# Patient Record
Sex: Female | Born: 1935 | Race: White | Hispanic: No | Smoking: Never smoker
Health system: Southern US, Community
[De-identification: ages and names within clinical notes are randomized; demographics above are authoritative.]

## PROBLEM LIST (undated history)

## (undated) DIAGNOSIS — I10 Essential (primary) hypertension: Secondary | ICD-10-CM

## (undated) DIAGNOSIS — I4891 Unspecified atrial fibrillation: Secondary | ICD-10-CM

## (undated) DIAGNOSIS — R55 Syncope and collapse: Secondary | ICD-10-CM

## (undated) DIAGNOSIS — R4020 Unspecified coma: Secondary | ICD-10-CM

## (undated) HISTORY — DX: Unspecified coma: R40.20

## (undated) HISTORY — DX: Unspecified atrial fibrillation: I48.91

## (undated) HISTORY — PX: BARTHOLIN GLAND CYST EXCISION: SHX565

---

## 2010-04-03 ENCOUNTER — Emergency Department (HOSPITAL_COMMUNITY): Payer: Medicare Other

## 2010-04-03 ENCOUNTER — Inpatient Hospital Stay (HOSPITAL_COMMUNITY)
Admission: EM | Admit: 2010-04-03 | Discharge: 2010-04-06 | DRG: 312 | Disposition: A | Discharge status: Home / Self Care | Payer: Medicare Other | Attending: Internal Medicine | Admitting: Internal Medicine

## 2010-04-03 DIAGNOSIS — R55 Syncope and collapse: Principal | ICD-10-CM | POA: Diagnosis present

## 2010-04-03 DIAGNOSIS — E785 Hyperlipidemia, unspecified: Secondary | ICD-10-CM | POA: Diagnosis present

## 2010-04-03 DIAGNOSIS — D72829 Elevated white blood cell count, unspecified: Secondary | ICD-10-CM | POA: Diagnosis present

## 2010-04-03 DIAGNOSIS — Z8249 Family history of ischemic heart disease and other diseases of the circulatory system: Secondary | ICD-10-CM

## 2010-04-03 DIAGNOSIS — I6789 Other cerebrovascular disease: Secondary | ICD-10-CM | POA: Diagnosis present

## 2010-04-03 DIAGNOSIS — I10 Essential (primary) hypertension: Secondary | ICD-10-CM | POA: Diagnosis present

## 2010-04-03 DIAGNOSIS — R000 Tachycardia, unspecified: Secondary | ICD-10-CM | POA: Diagnosis present

## 2010-04-03 DIAGNOSIS — E860 Dehydration: Secondary | ICD-10-CM | POA: Diagnosis present

## 2010-04-03 DIAGNOSIS — Z7982 Long term (current) use of aspirin: Secondary | ICD-10-CM

## 2010-04-03 LAB — CBC
MCH: 34.4 pg — ABNORMAL HIGH (ref 26.0–34.0)
MCHC: 34.7 g/dL (ref 30.0–36.0)
MCV: 99 fL (ref 78.0–100.0)
Platelets: 202 10*3/uL (ref 150–400)
RBC: 4.19 MIL/uL (ref 3.87–5.11)
RDW: 12.5 % (ref 11.5–15.5)

## 2010-04-03 LAB — BASIC METABOLIC PANEL
BUN: 17 mg/dL (ref 6–23)
CO2: 24 mEq/L (ref 19–32)
Calcium: 9.2 mg/dL (ref 8.4–10.5)
Chloride: 107 mEq/L (ref 96–112)
Creatinine, Ser: 0.92 mg/dL (ref 0.4–1.2)
GFR calc Af Amer: >60 mL/min (ref >60)
GFR calc non Af Amer: >60 mL/min (ref >60)
Glucose, Bld: 102 mg/dL — ABNORMAL HIGH (ref 70–99)
Potassium: 4.2 mEq/L (ref 3.5–5.1)
Sodium: 145 mEq/L (ref 135–145)

## 2010-04-03 LAB — HEPATIC FUNCTION PANEL
ALT: 23 U/L (ref 0–35)
AST: 25 U/L (ref 0–37)
Albumin: 3.8 g/dL (ref 3.5–5.2)
Alkaline Phosphatase: 83 U/L (ref 39–117)
Bilirubin, Direct: <0.1 mg/dL (ref 0.0–0.3)
Total Bilirubin: 0.4 mg/dL (ref 0.3–1.2)
Total Protein: 7.2 g/dL (ref 6.0–8.3)

## 2010-04-03 LAB — URINALYSIS, ROUTINE W REFLEX MICROSCOPIC
Bilirubin Urine: NEGATIVE
Ketones, ur: 15 mg/dL — AB
Nitrite: NEGATIVE
Specific Gravity, Urine: 1.02 (ref 1.005–1.030)
Urobilinogen, UA: 1 mg/dL (ref 0.0–1.0)

## 2010-04-03 LAB — DIFFERENTIAL
Basophils Relative: 0 % (ref 0–1)
Eosinophils Absolute: 0 10*3/uL (ref 0.0–0.7)
Eosinophils Relative: 0 % (ref 0–5)
Lymphs Abs: 0.8 10*3/uL (ref 0.7–4.0)
Monocytes Absolute: 0.8 10*3/uL (ref 0.1–1.0)
Monocytes Relative: 5 % (ref 3–12)

## 2010-04-03 LAB — TROPONIN I: Troponin I: 0.04 ng/mL (ref 0.00–0.06)

## 2010-04-04 ENCOUNTER — Emergency Department (HOSPITAL_COMMUNITY): Payer: Medicare Other

## 2010-04-04 DIAGNOSIS — R55 Syncope and collapse: Secondary | ICD-10-CM

## 2010-04-04 LAB — RAPID URINE DRUG SCREEN, HOSP PERFORMED
Benzodiazepines: NOT DETECTED
Cocaine: NOT DETECTED
Tetrahydrocannabinol: NOT DETECTED

## 2010-04-04 LAB — TSH: TSH: 1.964 u[IU]/mL (ref 0.350–4.500)

## 2010-04-04 LAB — CK TOTAL AND CKMB (NOT AT ARMC)
CK, MB: 1.2 ng/mL (ref 0.3–4.0)
Relative Index: INVALID (ref 0.0–2.5)
Total CK: 53 U/L (ref 7–177)

## 2010-04-04 LAB — TROPONIN I: Troponin I: 0.03 ng/mL (ref 0.00–0.06)

## 2010-04-04 NOTE — H&P (Signed)
NAME:  Gwendolyn [name], Gwendolyn [name] NO.:  [id number]  MEDICAL RECORD NO.:  [id number]           PATIENT TYPE:  E  LOCATION:  [facility]                         FACILITY:  [facility]  PHYSICIAN:  Talmage Nap, MD  DATE OF BIRTH:  12-23-1935  DATE OF ADMISSION:  04/03/2010 DATE OF DISCHARGE:                             HISTORY & PHYSICAL   PRIMARY CARE PHYSICIAN:  Unassigned.  History is obtainable from the patient.  The patient is a very pleasant 75 year old Caucasian female with no known medical history presenting to the emergency room with history of having passed out at the mall while attempting to buy a card.  The patient claimed that she had been in stable health until about 3:30 p.m. on April 03, 2010, she was at the mall attempting to buy a cat.  For a very brief period of time, she says she felt very dizzy, sweaty, and thereafter passed out on and found herself on the floor.  This was said to have been witnessed by people around.  She was not aware of any urinary or fecal incontinence.  Passing out was said to have been very transient.  Post passing out, she denied any chest pain.  She denied any shortness of breath.  She denied any fever, no chills, no rigor and subsequently was brought to the emergency room by EMS for evaluation.  She has no known past medical history.  PAST SURGICAL HISTORY:  Marsupialization for Bartholin cyst.  No preadmission meds.  No known allergies.  SOCIAL HISTORY:  Negative for tobacco use and occasionally takes wine, and she is retired.  FAMILY HISTORY:  York Spaniel to be positive for coronary artery disease and Parkinson's.  REVIEW OF SYSTEMS:  The patient denies any history of headaches.  No blurry vision.  No nausea or vomiting.  No fever.  No chills.  No rigor. No chest pain or shortness of breath.  Denies any cough.  No abdominal discomfort.  No diarrhea or hematochezia.  No dysuria or hematuria.  No swelling of the lower  extremity.  No intolerance of heat or cold, and no neuropsychiatric disorder.  PHYSICAL EXAMINATION:  GENERAL:  A very pleasant lady with suboptimal hydration, not in any obvious respiratory distress at present. VITAL SIGNS:  Blood pressure is 158/82, pulse is 98, respiratory rate is 18, temperature is 97.4. HEENT:  Pupils are reactive to light.  Extraocular muscles are intact. NECK:  No jugular venous distention.  No carotid bruit.  No lymphadenopathy. CHEST:  Clear to auscultation. CARDIAC:  Heart sounds are 1 and 2. ABDOMEN:  Soft, nontender.  Liver, spleen, kidney not palpable.  Bowel sounds are positive. EXTREMITIES:  No pedal edema. NEUROLOGIC:  Nonfocal. MUSCULOSKELETAL:  Arthritic changes in the knees. NEUROPSYCHIATRIC:  Unremarkable. SKIN:  Decreased turgor.  LABORATORY DATA:  Initial urinalysis unremarkable.  LFTs normal.  First set of cardiac markers, troponin I less than 0.04.  Baseline chemistry showed sodium of 145, potassium of 4.2, chloride of 107 with a bicarb of 24, glucose is 102, BUN is 17, creatinine is 0.92.  Hematology indices showed WBC of 14.4, hemoglobin of 14.2, hematocrit of 41.5,  MCV of 95.0, platelet count of 202, and neutrophil of 89% and absolute granulocyte count of 12.8, elevated.  EKG showed a normal sinus rhythm with nonspecific T-wave changes in the anterolateral leads, rate of 94 beats per minute. Chest x-ray normal. Imaging studies include chest x-ray which was normal. Official report of CT not seen.  ADMITTING IMPRESSION: 1. Syncope, questionable vasovagal. 2. Dehydration. 3. Leukocytosis with the left shift, etiology is unknown.  Plan is to admit the patient to telemetry.  The patient will be rehydrated with half-normal saline IV to go at a rate of 50 mL/hour. She will be on aspirin 81 mg p.o. daily, Rocephin 1 g IV q. 24 h., Protonix 40 mg IV q.24 h. for GI prophylaxis, and Lovenox 40 mg subcu q.24 h. for DVT prophylaxis.  Labs to  be ordered in this patient will include cardiac enzymes q.6 h. x3, thyroid panel, TSH, T3, and T4. Urine drug screen.  Blood culture x2 before starting IV antibiotics as well as urine culture.  Imaging study to be ordered will include CT head with and without contrast, stat if not already done, a carotid duplex and 2-D echo.  The patient will be reevaluated with lab results.     Talmage Nap, MD     CN/MEDQ  D:  04/04/2010  T:  04/04/2010  Job:  [id number]  Electronically Signed by Talmage Nap  on 04/04/2010 08:05:16 AM

## 2010-04-05 DIAGNOSIS — R55 Syncope and collapse: Secondary | ICD-10-CM

## 2010-04-05 LAB — LIPID PANEL
Cholesterol: 250 mg/dL — ABNORMAL HIGH (ref 0–200)
HDL: 101 mg/dL (ref >39)
Triglycerides: 82 mg/dL (ref <150)

## 2010-04-05 LAB — BASIC METABOLIC PANEL
BUN: 13 mg/dL (ref 6–23)
GFR calc Af Amer: >60 mL/min (ref >60)
GFR calc non Af Amer: >60 mL/min (ref >60)
Potassium: 4 mEq/L (ref 3.5–5.1)
Sodium: 143 mEq/L (ref 135–145)

## 2010-04-05 LAB — URINE CULTURE: Culture  Setup Time: 201202241111

## 2010-04-05 LAB — CBC
Platelets: 161 10*3/uL (ref 150–400)
RDW: 12.5 % (ref 11.5–15.5)
WBC: 5.2 10*3/uL (ref 4.0–10.5)

## 2010-04-08 NOTE — Discharge Summary (Signed)
NAME:  Gwendolyn [name], Gwendolyn [name] NO.:  [id number]  MEDICAL RECORD NO.:  [id number]           PATIENT TYPE:  I  LOCATION:  3705                         FACILITY:  [facility]  PHYSICIAN:  Andreas Blower, MD       DATE OF BIRTH:  [birth year]  DATE OF ADMISSION:  04/03/2010 DATE OF DISCHARGE:  04/06/2010                              DISCHARGE SUMMARY   PRIMARY CARE PHYSICIAN:  The patient does not have a primary care physician, is in the process of establishing a primary care physician.  DISCHARGE DIAGNOSES: 1. Syncope.  No clear etiology could be ascertained. 2. Hypertension. 3. Hyperlipidemia. 4. Small vessel cerebrovascular disease. 5. Leukocytosis, resolved. 6. Normal TSH was suppressed free T4 and T3.  DISCHARGE MEDICATIONS: 1. Aspirin 81 mg p.o. daily. 2. Amlodipine 10 mg p.o. daily. 3. Simvastatin 10 mg p.o. q. p.m.  BRIEF ADMITTING HISTORY AND PHYSICAL:  Gwendolyn [name] is a 75 year old Caucasian female with no significant past medical history who presented to the ER after she had an episode at the mall when she lost consciousness on April 03, 2010.  RADIOLOGY/IMAGING: 1. The patient had chest x-ray, 2-view, which shows no acute disease. 2. The patient had a head CT without contrast on April 04, 2010,     which showed a left parietal and posterior frontal scalp hematoma     without underlying fracture.  Mild atrophy and white matter     disease.  Likely reflects the sequelae of chronic microvascular     ischemia.  No acute intracranial abnormality. 3. The patient had a 2-D echocardiogram on April 05, 2010, which     showed left ventricle cavity size was normal and mild concentric     hypertrophy.  Systolic function was normal.  Ejection fraction was     55-60%.  Wall motion was normal.  There were no regional wall     motion abnormalities.  LABORATORY DATA:  CBC shows a white count of 5.2, hemoglobin 11.8, hematocrit 35.9, and platelet count 161.   Electrolytes normal with a creatinine of 0.90 and troponin negative x3.  LDL is 133, total cholesterol is 250.  TSH is 1.96, T4 is 4.3, and T3 is 46.7.  Urine drug screen was negative.  UA was negative for nitrites and leukocytes. Blood cultures x2 no growth to date.  Urine culture shows multiple bacterial morphotypes.  HOSPITAL COURSE BY PROBLEM: 1. Syncope, etiology unclear:  The patient had no events on tele.     Head CT was negative.  Echocardiogram was negative.  The patient     was adequately hydrated during the course of the hospital stay.     Orthostatics were checked at the time of admission and was     normal. 2. Hypertension:  The patient's blood pressure was elevated during the     course of the hospital stay.  As a result, she was initially     started on amlodipine 5 mg and was titrated up to 10 mg daily.  At     the time of discharge, her blood pressure is not optimally     controlled,  but I suspect that as the full-dose amlodipine stars to     take effect her blood pressure should improve.  The patient was     instructed to have her blood pressure checked at Pharmacy or     doctor's office.  The patient was instructed to contact her     insurance to establish care with a primary care physician for     further management of her hypertension. 3. Hyperlipidemia with mildly elevated LDL:  Given her risk factors,     she was started on simvastatin.  I suspect that the patient diets     and exercises.  She may eventually come off statin, but we will     defer to her primary care physician.  Small vessel cerebral     vascular disease.  The patient was started on aspirin. 4. Leukocytosis:  She was initially empirically started on     ceftriaxone, but given UA was negative and no sources of fever her     ceftriaxone was discontinued and her leukocytosis resolved. 5. Normal TSH with suppressed T4 and T3:  Etiology uncertain at this     time.  Possible euthyroid.  The patient was  instructed to have her     TSH and free T4 checked in about 3 months at her PCP's office and     to determine if she needs to be started on Synthroid.  DISPOSITION AND FOLLOWUP:  The patient was instructed to contact her insurance and determine which physician is within her network provider to establish a primary care physician.  Time spent on discharge talking to the patient and coordinating care was 25 minutes.     Andreas Blower, MD     SR/MEDQ  D:  04/06/2010  T:  04/06/2010  Job:  [id number]  Electronically Signed by Wardell Heath Neddie Steedman  on 04/08/2010 09:07:27 PM

## 2010-04-10 LAB — CULTURE, BLOOD (ROUTINE X 2): Culture: NO GROWTH

## 2010-07-18 ENCOUNTER — Other Ambulatory Visit: Payer: Self-pay | Admitting: Internal Medicine

## 2010-07-18 DIAGNOSIS — R945 Abnormal results of liver function studies: Secondary | ICD-10-CM

## 2010-07-22 ENCOUNTER — Ambulatory Visit
Admission: RE | Admit: 2010-07-22 | Discharge: 2010-07-22 | Disposition: A | Discharge status: Home / Self Care | Payer: Medicare Other | Source: Ambulatory Visit | Attending: Internal Medicine | Admitting: Internal Medicine

## 2011-06-24 DIAGNOSIS — E7800 Pure hypercholesterolemia, unspecified: Secondary | ICD-10-CM | POA: Diagnosis not present

## 2011-06-24 DIAGNOSIS — R5381 Other malaise: Secondary | ICD-10-CM | POA: Diagnosis not present

## 2011-06-24 DIAGNOSIS — I10 Essential (primary) hypertension: Secondary | ICD-10-CM | POA: Diagnosis not present

## 2011-06-29 DIAGNOSIS — R7309 Other abnormal glucose: Secondary | ICD-10-CM | POA: Diagnosis not present

## 2011-06-29 DIAGNOSIS — E7800 Pure hypercholesterolemia, unspecified: Secondary | ICD-10-CM | POA: Diagnosis not present

## 2011-06-29 DIAGNOSIS — I10 Essential (primary) hypertension: Secondary | ICD-10-CM | POA: Diagnosis not present

## 2011-06-29 DIAGNOSIS — Z0000 Encounter for general adult medical examination without abnormal findings: Secondary | ICD-10-CM | POA: Diagnosis not present

## 2011-07-13 DIAGNOSIS — Z1212 Encounter for screening for malignant neoplasm of rectum: Secondary | ICD-10-CM | POA: Diagnosis not present

## 2011-07-29 ENCOUNTER — Other Ambulatory Visit (HOSPITAL_COMMUNITY): Admission: RE | Admit: 2011-07-29 | Payer: Medicare Other | Source: Ambulatory Visit | Admitting: Internal Medicine

## 2011-07-29 ENCOUNTER — Inpatient Hospital Stay (HOSPITAL_COMMUNITY): Admit: 2011-07-29 | Payer: Self-pay

## 2011-07-29 DIAGNOSIS — Z01419 Encounter for gynecological examination (general) (routine) without abnormal findings: Secondary | ICD-10-CM | POA: Diagnosis not present

## 2011-07-29 DIAGNOSIS — E2839 Other primary ovarian failure: Secondary | ICD-10-CM | POA: Diagnosis not present

## 2011-07-29 DIAGNOSIS — Z124 Encounter for screening for malignant neoplasm of cervix: Secondary | ICD-10-CM | POA: Insufficient documentation

## 2011-07-31 ENCOUNTER — Other Ambulatory Visit: Payer: Self-pay | Admitting: Internal Medicine

## 2011-07-31 DIAGNOSIS — Z1231 Encounter for screening mammogram for malignant neoplasm of breast: Secondary | ICD-10-CM

## 2011-08-14 ENCOUNTER — Ambulatory Visit: Payer: Medicare Other

## 2011-12-30 DIAGNOSIS — R7309 Other abnormal glucose: Secondary | ICD-10-CM | POA: Diagnosis not present

## 2011-12-30 DIAGNOSIS — I10 Essential (primary) hypertension: Secondary | ICD-10-CM | POA: Diagnosis not present

## 2012-01-05 IMAGING — CT CT HEAD W/O CM
1 series · 16 of 30 positions shown, 20 images · non-contrast
Comparison: None.

CLINICAL DATA: Fall.  Syncopal episode.  Trauma to left posterior
head.

CT HEAD WITHOUT CONTRAST
TECHNIQUE: Contiguous axial images were obtained from the base of
the skull through the vertex without contrast.

[Series 3: head trauma 2.4 h60s · axial · 0.43mm/px · z∈[+1016,+1173]mm · 16 of 72 slices shown, 20 images]
[im 3/72  brain]
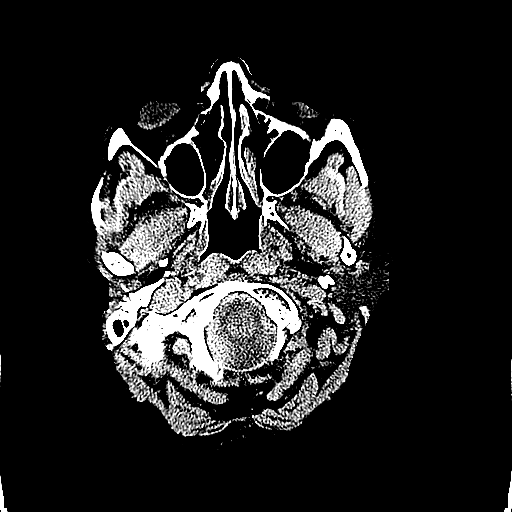
[im 3/72  bone]
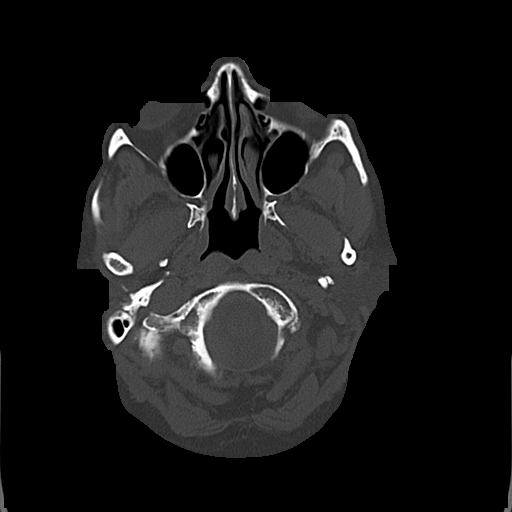
[im 8/72  brain]
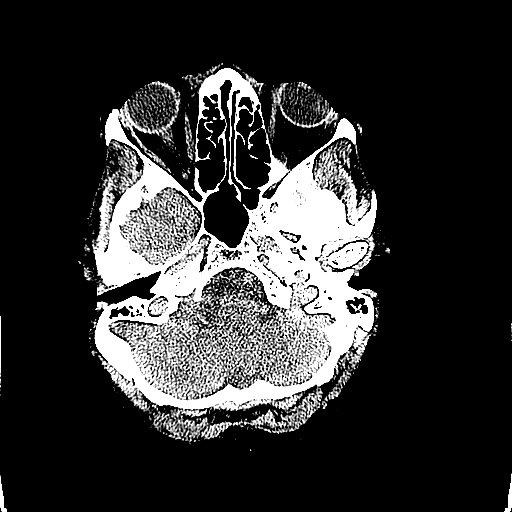
[im 13/72  brain]
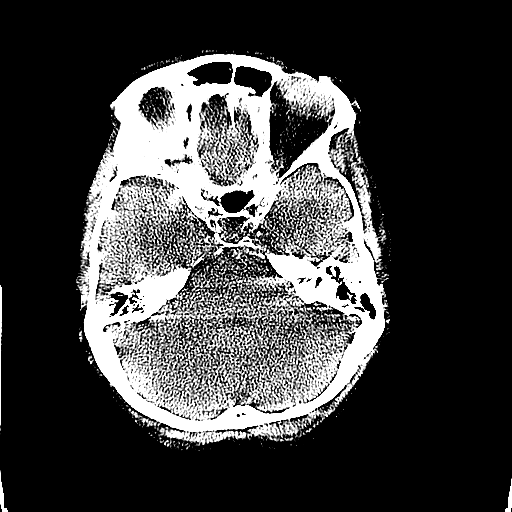
[im 18/72  brain]
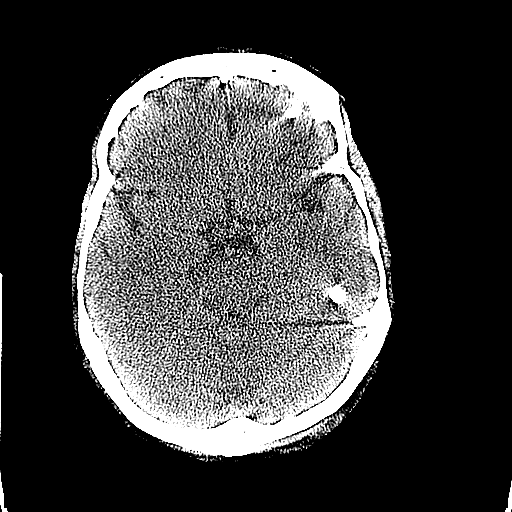
[im 20/72  brain]
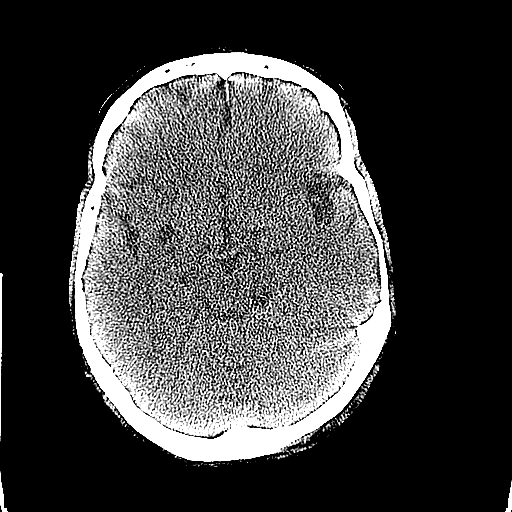
[im 20/72  bone]
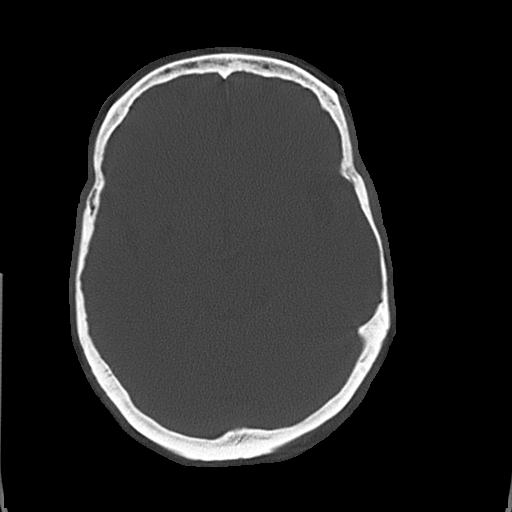
[im 25/72  brain]
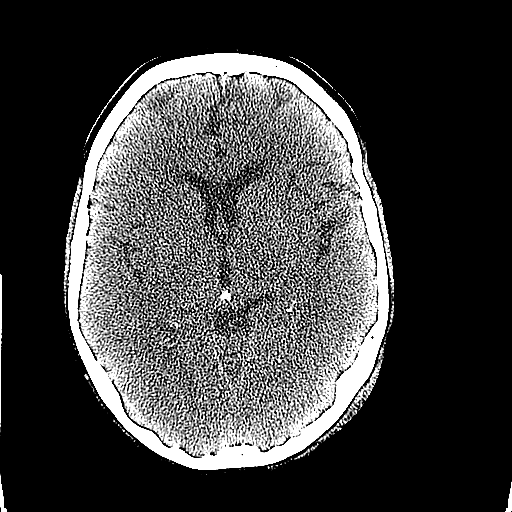
[im 30/72  brain]
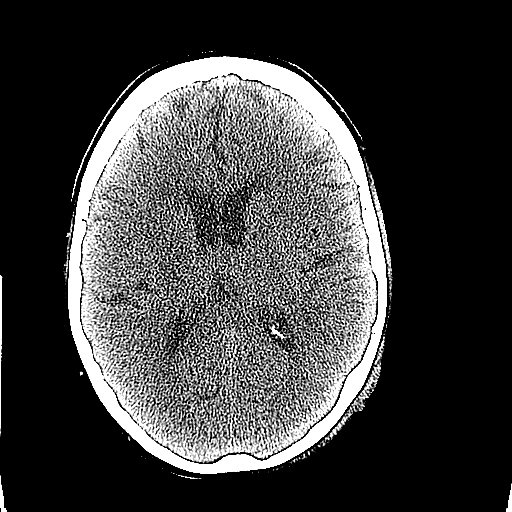
[im 35/72  brain]
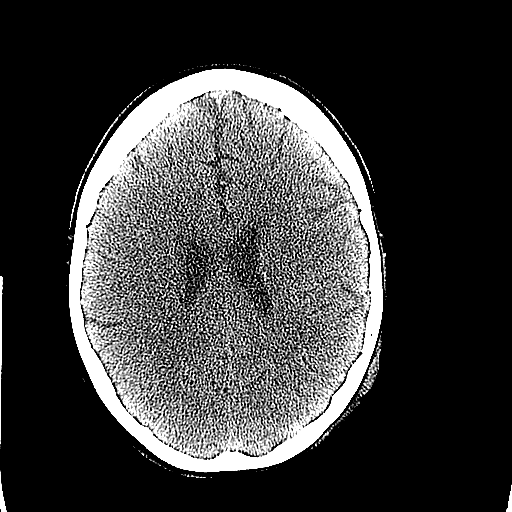
[im 37/72  brain]
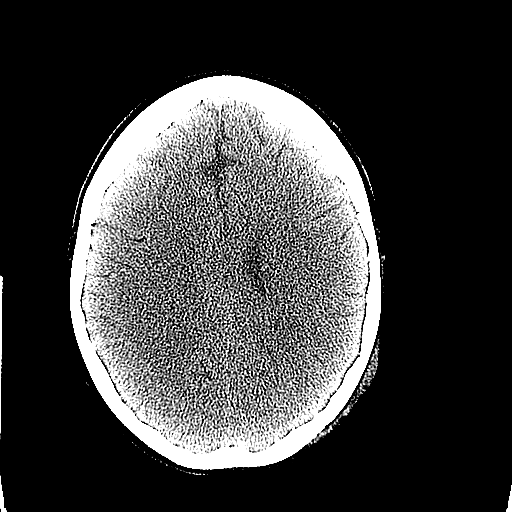
[im 37/72  bone]
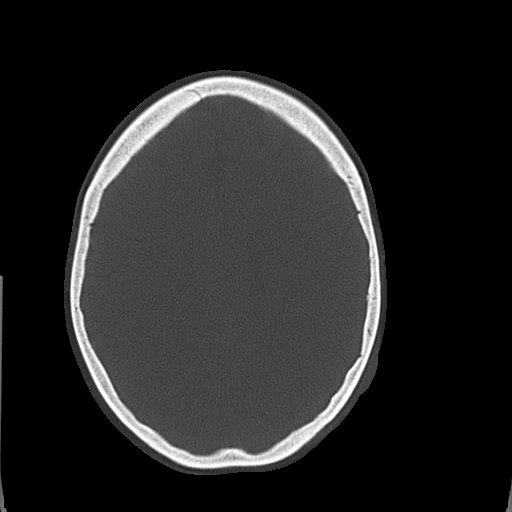
[im 42/72  brain]
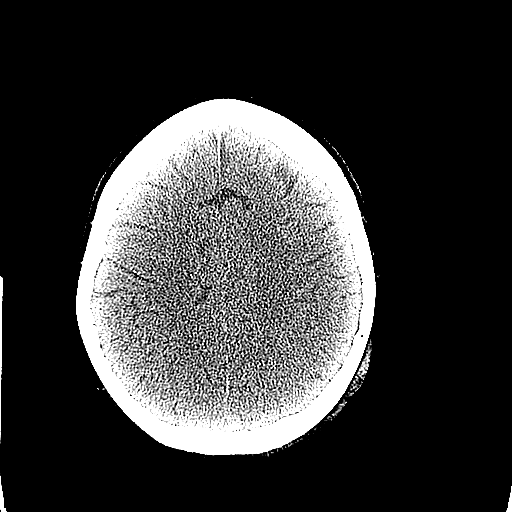
[im 47/72  brain]
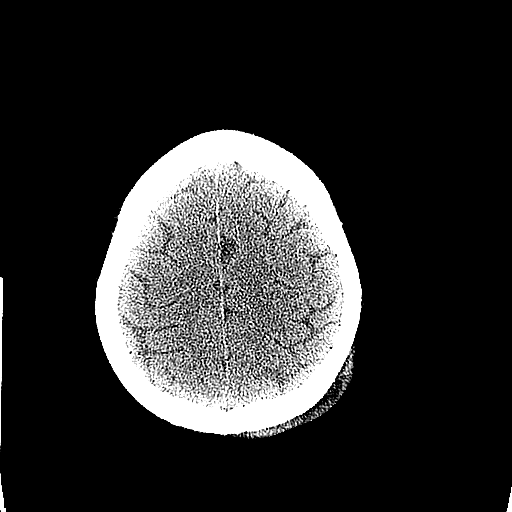
[im 52/72  brain]
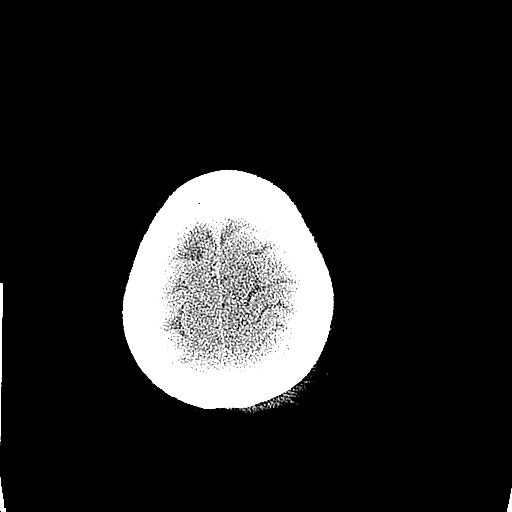
[im 54/72  brain]
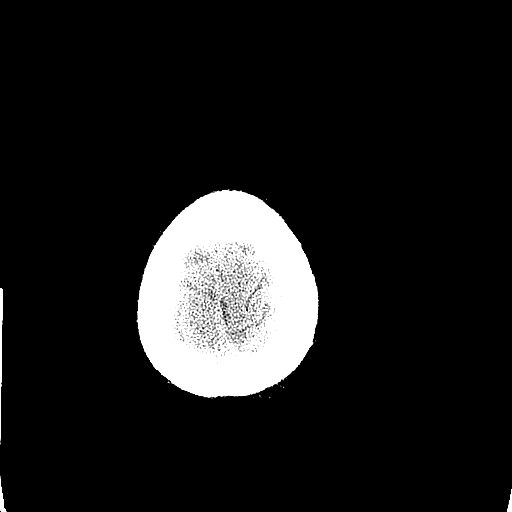
[im 54/72  bone]
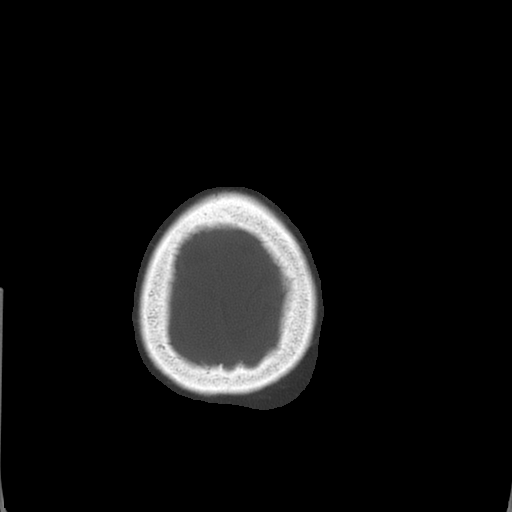
[im 59/72  brain]
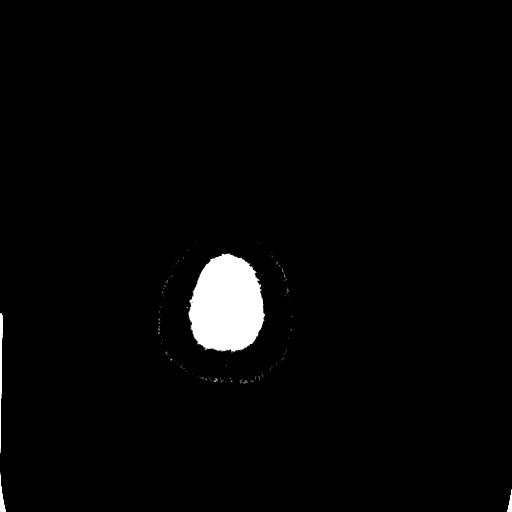
[im 64/72  brain]
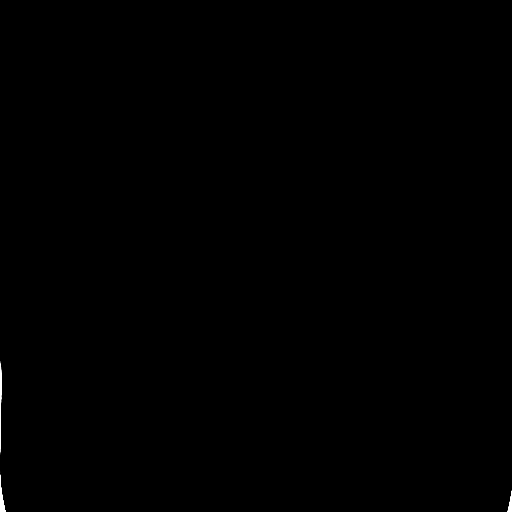
[im 69/72  brain]
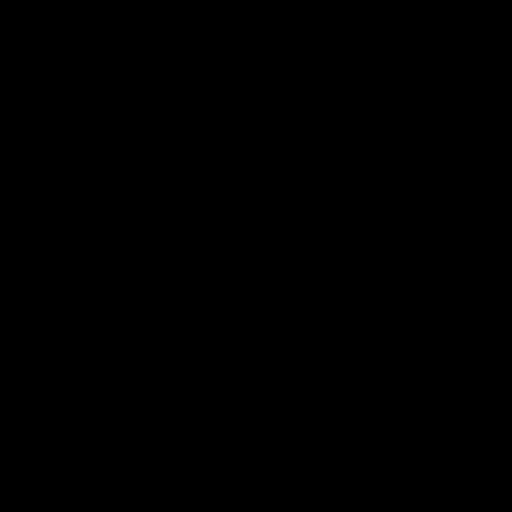

[16 of 30 positions shown; findings below may reference images not displayed]

FINDINGS: Soft tissue swelling is present in the left parietal and
posterior frontal scalp.  There is no underlying fracture.  No
acute cortical infarct, hemorrhage, or mass lesion is present.
Mild periventricular white matter hypoattenuation is present
bilaterally.  The ventricles are of normal size.  No significant
extra-axial fluid collection is present.

Atherosclerotic calcifications are present in the cavernous carotid
arteries bilaterally.  The paranasal sinuses and mastoid air cells
are clear.
IMPRESSION: 1.  Left parietal and posterior frontal scalp hematoma without
underlying fracture.
2.  Mild atrophy white matter disease.  This likely reflects the
sequelae of chronic microvascular ischemia.
3.  No acute intracranial abnormality.

## 2012-01-11 DIAGNOSIS — Z23 Encounter for immunization: Secondary | ICD-10-CM | POA: Diagnosis not present

## 2012-01-11 DIAGNOSIS — R7309 Other abnormal glucose: Secondary | ICD-10-CM | POA: Diagnosis not present

## 2012-01-11 DIAGNOSIS — E7800 Pure hypercholesterolemia, unspecified: Secondary | ICD-10-CM | POA: Diagnosis not present

## 2012-01-11 DIAGNOSIS — I10 Essential (primary) hypertension: Secondary | ICD-10-CM | POA: Diagnosis not present

## 2012-02-01 DIAGNOSIS — I10 Essential (primary) hypertension: Secondary | ICD-10-CM | POA: Diagnosis not present

## 2012-02-01 DIAGNOSIS — E7800 Pure hypercholesterolemia, unspecified: Secondary | ICD-10-CM | POA: Diagnosis not present

## 2012-08-01 DIAGNOSIS — I10 Essential (primary) hypertension: Secondary | ICD-10-CM | POA: Diagnosis not present

## 2012-08-08 DIAGNOSIS — R7309 Other abnormal glucose: Secondary | ICD-10-CM | POA: Diagnosis not present

## 2012-08-08 DIAGNOSIS — I10 Essential (primary) hypertension: Secondary | ICD-10-CM | POA: Diagnosis not present

## 2012-08-08 DIAGNOSIS — E7800 Pure hypercholesterolemia, unspecified: Secondary | ICD-10-CM | POA: Diagnosis not present

## 2012-11-08 DIAGNOSIS — R7309 Other abnormal glucose: Secondary | ICD-10-CM | POA: Diagnosis not present

## 2012-11-08 DIAGNOSIS — I10 Essential (primary) hypertension: Secondary | ICD-10-CM | POA: Diagnosis not present

## 2012-11-14 DIAGNOSIS — R7309 Other abnormal glucose: Secondary | ICD-10-CM | POA: Diagnosis not present

## 2012-11-14 DIAGNOSIS — E7800 Pure hypercholesterolemia, unspecified: Secondary | ICD-10-CM | POA: Diagnosis not present

## 2012-11-14 DIAGNOSIS — I10 Essential (primary) hypertension: Secondary | ICD-10-CM | POA: Diagnosis not present

## 2012-11-14 DIAGNOSIS — Z23 Encounter for immunization: Secondary | ICD-10-CM | POA: Diagnosis not present

## 2013-05-15 DIAGNOSIS — I10 Essential (primary) hypertension: Secondary | ICD-10-CM | POA: Diagnosis not present

## 2013-05-15 DIAGNOSIS — R7309 Other abnormal glucose: Secondary | ICD-10-CM | POA: Diagnosis not present

## 2013-05-23 ENCOUNTER — Other Ambulatory Visit: Payer: Self-pay | Admitting: Internal Medicine

## 2013-05-23 DIAGNOSIS — R0989 Other specified symptoms and signs involving the circulatory and respiratory systems: Secondary | ICD-10-CM

## 2013-05-23 DIAGNOSIS — E7800 Pure hypercholesterolemia, unspecified: Secondary | ICD-10-CM | POA: Diagnosis not present

## 2013-05-23 DIAGNOSIS — I10 Essential (primary) hypertension: Secondary | ICD-10-CM | POA: Diagnosis not present

## 2013-05-23 DIAGNOSIS — R7309 Other abnormal glucose: Secondary | ICD-10-CM | POA: Diagnosis not present

## 2013-05-31 ENCOUNTER — Ambulatory Visit
Admission: RE | Admit: 2013-05-31 | Discharge: 2013-05-31 | Disposition: A | Discharge status: Home / Self Care | Payer: Medicare Other | Source: Ambulatory Visit | Attending: Internal Medicine | Admitting: Internal Medicine

## 2013-05-31 DIAGNOSIS — R0989 Other specified symptoms and signs involving the circulatory and respiratory systems: Secondary | ICD-10-CM

## 2013-05-31 DIAGNOSIS — I658 Occlusion and stenosis of other precerebral arteries: Secondary | ICD-10-CM | POA: Diagnosis not present

## 2013-11-16 DIAGNOSIS — R7309 Other abnormal glucose: Secondary | ICD-10-CM | POA: Diagnosis not present

## 2013-11-16 DIAGNOSIS — I10 Essential (primary) hypertension: Secondary | ICD-10-CM | POA: Diagnosis not present

## 2013-11-22 DIAGNOSIS — Z23 Encounter for immunization: Secondary | ICD-10-CM | POA: Diagnosis not present

## 2013-11-22 DIAGNOSIS — Z780 Asymptomatic menopausal state: Secondary | ICD-10-CM | POA: Diagnosis not present

## 2013-11-22 DIAGNOSIS — I10 Essential (primary) hypertension: Secondary | ICD-10-CM | POA: Diagnosis not present

## 2013-11-22 DIAGNOSIS — Z01419 Encounter for gynecological examination (general) (routine) without abnormal findings: Secondary | ICD-10-CM | POA: Diagnosis not present

## 2014-07-04 DIAGNOSIS — I10 Essential (primary) hypertension: Secondary | ICD-10-CM | POA: Diagnosis not present

## 2014-07-04 DIAGNOSIS — E780 Pure hypercholesterolemia: Secondary | ICD-10-CM | POA: Diagnosis not present

## 2014-07-04 DIAGNOSIS — R739 Hyperglycemia, unspecified: Secondary | ICD-10-CM | POA: Diagnosis not present

## 2014-07-11 DIAGNOSIS — I10 Essential (primary) hypertension: Secondary | ICD-10-CM | POA: Diagnosis not present

## 2014-07-11 DIAGNOSIS — E780 Pure hypercholesterolemia: Secondary | ICD-10-CM | POA: Diagnosis not present

## 2014-07-11 DIAGNOSIS — R739 Hyperglycemia, unspecified: Secondary | ICD-10-CM | POA: Diagnosis not present

## 2014-12-30 DIAGNOSIS — Z23 Encounter for immunization: Secondary | ICD-10-CM | POA: Diagnosis not present

## 2015-01-07 DIAGNOSIS — I10 Essential (primary) hypertension: Secondary | ICD-10-CM | POA: Diagnosis not present

## 2015-01-07 DIAGNOSIS — Z1212 Encounter for screening for malignant neoplasm of rectum: Secondary | ICD-10-CM | POA: Diagnosis not present

## 2015-01-10 DIAGNOSIS — I10 Essential (primary) hypertension: Secondary | ICD-10-CM | POA: Diagnosis not present

## 2015-01-10 DIAGNOSIS — E7800 Pure hypercholesterolemia, unspecified: Secondary | ICD-10-CM | POA: Diagnosis not present

## 2015-01-10 DIAGNOSIS — Z0000 Encounter for general adult medical examination without abnormal findings: Secondary | ICD-10-CM | POA: Diagnosis not present

## 2015-01-10 DIAGNOSIS — R739 Hyperglycemia, unspecified: Secondary | ICD-10-CM | POA: Diagnosis not present

## 2015-07-15 DIAGNOSIS — I10 Essential (primary) hypertension: Secondary | ICD-10-CM | POA: Diagnosis not present

## 2015-07-15 DIAGNOSIS — E559 Vitamin D deficiency, unspecified: Secondary | ICD-10-CM | POA: Diagnosis not present

## 2015-07-15 DIAGNOSIS — R739 Hyperglycemia, unspecified: Secondary | ICD-10-CM | POA: Diagnosis not present

## 2015-07-15 DIAGNOSIS — E2839 Other primary ovarian failure: Secondary | ICD-10-CM | POA: Diagnosis not present

## 2015-07-15 DIAGNOSIS — Z0000 Encounter for general adult medical examination without abnormal findings: Secondary | ICD-10-CM | POA: Diagnosis not present

## 2015-07-15 DIAGNOSIS — N390 Urinary tract infection, site not specified: Secondary | ICD-10-CM | POA: Diagnosis not present

## 2015-07-22 DIAGNOSIS — R739 Hyperglycemia, unspecified: Secondary | ICD-10-CM | POA: Diagnosis not present

## 2015-07-22 DIAGNOSIS — I10 Essential (primary) hypertension: Secondary | ICD-10-CM | POA: Diagnosis not present

## 2015-07-22 DIAGNOSIS — E7800 Pure hypercholesterolemia, unspecified: Secondary | ICD-10-CM | POA: Diagnosis not present

## 2015-07-22 DIAGNOSIS — E559 Vitamin D deficiency, unspecified: Secondary | ICD-10-CM | POA: Diagnosis not present

## 2015-10-07 ENCOUNTER — Other Ambulatory Visit: Payer: Self-pay

## 2015-12-05 DIAGNOSIS — Z23 Encounter for immunization: Secondary | ICD-10-CM | POA: Diagnosis not present

## 2016-01-21 DIAGNOSIS — R739 Hyperglycemia, unspecified: Secondary | ICD-10-CM | POA: Diagnosis not present

## 2016-01-21 DIAGNOSIS — E559 Vitamin D deficiency, unspecified: Secondary | ICD-10-CM | POA: Diagnosis not present

## 2016-01-21 DIAGNOSIS — I10 Essential (primary) hypertension: Secondary | ICD-10-CM | POA: Diagnosis not present

## 2016-01-29 DIAGNOSIS — E7800 Pure hypercholesterolemia, unspecified: Secondary | ICD-10-CM | POA: Diagnosis not present

## 2016-01-29 DIAGNOSIS — Z01419 Encounter for gynecological examination (general) (routine) without abnormal findings: Secondary | ICD-10-CM | POA: Diagnosis not present

## 2016-01-29 DIAGNOSIS — I10 Essential (primary) hypertension: Secondary | ICD-10-CM | POA: Diagnosis not present

## 2016-01-29 DIAGNOSIS — E559 Vitamin D deficiency, unspecified: Secondary | ICD-10-CM | POA: Diagnosis not present

## 2016-08-03 DIAGNOSIS — I10 Essential (primary) hypertension: Secondary | ICD-10-CM | POA: Diagnosis not present

## 2016-08-03 DIAGNOSIS — E559 Vitamin D deficiency, unspecified: Secondary | ICD-10-CM | POA: Diagnosis not present

## 2016-08-10 DIAGNOSIS — E7800 Pure hypercholesterolemia, unspecified: Secondary | ICD-10-CM | POA: Diagnosis not present

## 2016-08-10 DIAGNOSIS — E559 Vitamin D deficiency, unspecified: Secondary | ICD-10-CM | POA: Diagnosis not present

## 2016-08-10 DIAGNOSIS — I10 Essential (primary) hypertension: Secondary | ICD-10-CM | POA: Diagnosis not present

## 2016-08-10 DIAGNOSIS — R739 Hyperglycemia, unspecified: Secondary | ICD-10-CM | POA: Diagnosis not present

## 2016-12-07 DIAGNOSIS — Z23 Encounter for immunization: Secondary | ICD-10-CM | POA: Diagnosis not present

## 2017-03-01 ENCOUNTER — Emergency Department (HOSPITAL_COMMUNITY)
Admission: EM | Admit: 2017-03-01 | Discharge: 2017-03-02 | Disposition: A | Discharge status: Home / Self Care | Payer: Medicare Other | Attending: Emergency Medicine | Admitting: Emergency Medicine

## 2017-03-01 ENCOUNTER — Encounter (HOSPITAL_COMMUNITY): Payer: Self-pay

## 2017-03-01 ENCOUNTER — Emergency Department (HOSPITAL_COMMUNITY): Payer: Medicare Other

## 2017-03-01 DIAGNOSIS — J9811 Atelectasis: Secondary | ICD-10-CM | POA: Diagnosis not present

## 2017-03-01 DIAGNOSIS — R55 Syncope and collapse: Secondary | ICD-10-CM | POA: Diagnosis not present

## 2017-03-01 DIAGNOSIS — I480 Paroxysmal atrial fibrillation: Secondary | ICD-10-CM | POA: Diagnosis not present

## 2017-03-01 DIAGNOSIS — I10 Essential (primary) hypertension: Secondary | ICD-10-CM | POA: Diagnosis not present

## 2017-03-01 DIAGNOSIS — R000 Tachycardia, unspecified: Secondary | ICD-10-CM | POA: Diagnosis not present

## 2017-03-01 HISTORY — DX: Essential (primary) hypertension: I10

## 2017-03-01 LAB — BASIC METABOLIC PANEL
Anion gap: 13 (ref 5–15)
BUN: 28 mg/dL — ABNORMAL HIGH (ref 6–20)
CO2: 25 mmol/L (ref 22–32)
Calcium: 9.6 mg/dL (ref 8.9–10.3)
Chloride: 101 mmol/L (ref 101–111)
Creatinine, Ser: 1.05 mg/dL — ABNORMAL HIGH (ref 0.44–1.00)
GFR calc Af Amer: 57 mL/min — ABNORMAL LOW (ref >60)
GFR calc non Af Amer: 49 mL/min — ABNORMAL LOW (ref >60)
Glucose, Bld: 128 mg/dL — ABNORMAL HIGH (ref 65–99)
Potassium: 4.2 mmol/L (ref 3.5–5.1)
Sodium: 139 mmol/L (ref 135–145)

## 2017-03-01 LAB — CBC WITH DIFFERENTIAL/PLATELET
BASOS PCT: 1 %
Basophils Absolute: 0 10*3/uL (ref 0.0–0.1)
Eosinophils Absolute: 0.1 10*3/uL (ref 0.0–0.7)
Eosinophils Relative: 1 %
HCT: 38.2 % (ref 36.0–46.0)
HEMOGLOBIN: 13 g/dL (ref 12.0–15.0)
Lymphocytes Relative: 21 %
Lymphs Abs: 1.6 10*3/uL (ref 0.7–4.0)
MCH: 34.6 pg — ABNORMAL HIGH (ref 26.0–34.0)
MCHC: 34 g/dL (ref 30.0–36.0)
MCV: 101.6 fL — ABNORMAL HIGH (ref 78.0–100.0)
MONO ABS: 0.9 10*3/uL (ref 0.1–1.0)
Monocytes Relative: 11 %
NEUTROS PCT: 66 %
Neutro Abs: 5.1 10*3/uL (ref 1.7–7.7)
Platelets: 205 10*3/uL (ref 150–400)
RBC: 3.76 MIL/uL — ABNORMAL LOW (ref 3.87–5.11)
RDW: 13.4 % (ref 11.5–15.5)
WBC: 7.7 10*3/uL (ref 4.0–10.5)

## 2017-03-01 LAB — I-STAT TROPONIN, ED: TROPONIN I, POC: 0.02 ng/mL (ref 0.00–0.08)

## 2017-03-01 NOTE — ED Triage Notes (Signed)
Pt via EMS after syncopal episode and episode of A fib rate 130-170 bpm. 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 Pt lethargic, diaphoretic, and pale on EMS arrival with BP of 72/50. Pt became more alert when placed supine. Per EMS, pt EKG A fib 130-170 en route, pt with one episode of vomiting. Pt A&Ox4 at this time. Respirations even and unlabored. Skin warm dry and intact. Denies CP, SOB. Pt reports she "feels fine now" and "wants to go home." EMS VS: 126/90, 98% on RA, CBG 234. No hx of A fib

## 2017-03-01 NOTE — ED Notes (Signed)
Pt returned from X-ray.  

## 2017-03-01 NOTE — ED Provider Notes (Addendum)
[facility] EMERGENCY DEPARTMENT Provider Note   CSN: [id number] Arrival date & time: 03/01/17  2119     History   Chief Complaint Chief Complaint  Patient presents with  . Atrial Fibrillation  . Loss of Consciousness    HPI Gwendolyn [name] is a 82 y.o. female.  Patient is a 82 year old female with a history of hypertension who presents after syncopal episode.  She states that she was standing in the kitchen of her employer's house and she started feeling a little bit weak and lightheaded.  She had a feeling that she may pass out and sat down in a chair.  At that point she did have a syncopal episode.  She was unresponsive for a few minutes.  She did not fall out of the chair.  There is no reported seizure activity.  On EMS arrival, she was in A. fib with RVR.  But the rate was varying from the 130s-170s.  She was also noted to be hypotensive with a blood pressure in the 70s.  She converted spontaneously in route.  She currently denies any symptoms.  She states she feels fine and is ready to go.  She denies any palpitations or feeling like her hearts been racing.  She denies any chest pain or shortness of breath.  No recent illnesses.  No history of atrial fibrillation in the past that she knows of.      Past Medical History:  Diagnosis Date  . Hypertension     There are no active problems to display for this patient.   Past Surgical History:  Procedure Laterality Date  . BARTHOLIN GLAND CYST EXCISION      OB History    No data available       Home Medications    Prior to Admission medications   Not on File    Family History History reviewed. No pertinent family history.  Social History Social History   Tobacco Use  . Smoking status: Never Smoker  . Smokeless tobacco: Never Used  Substance Use Topics  . Alcohol use: Yes    Comment: occasional  . Drug use: No     Allergies   Patient has no known allergies.   Review of  Systems Review of Systems  Constitutional: Positive for fatigue. Negative for chills, diaphoresis and fever.  HENT: Negative for congestion, rhinorrhea and sneezing.   Eyes: Negative.   Respiratory: Negative for cough, chest tightness and shortness of breath.   Cardiovascular: Negative for chest pain, palpitations and leg swelling.  Gastrointestinal: Negative for abdominal pain, blood in stool, diarrhea, nausea and vomiting.  Genitourinary: Negative for difficulty urinating, flank pain, frequency and hematuria.  Musculoskeletal: Negative for arthralgias and back pain.  Skin: Negative for rash.  Neurological: Positive for syncope and light-headedness. Negative for dizziness, speech difficulty, weakness, numbness and headaches.     Physical Exam Updated Vital Signs BP (!) 146/91   Pulse 61   Resp (!) 27   SpO2 99%   Physical Exam  Constitutional: She is oriented to person, place, and time. She appears well-developed and well-nourished.  HENT:  Head: Normocephalic and atraumatic.  Eyes: Pupils are equal, round, and reactive to light.  Neck: Normal range of motion. Neck supple.  Cardiovascular: Normal rate, regular rhythm and normal heart sounds.  Pulmonary/Chest: Effort normal and breath sounds normal. No respiratory distress. She has no wheezes. She has no rales. She exhibits no tenderness.  Abdominal: Soft. Bowel sounds are normal. There is no  tenderness. There is no rebound and no guarding.  Musculoskeletal: Normal range of motion. She exhibits no edema.  Lymphadenopathy:    She has no cervical adenopathy.  Neurological: She is alert and oriented to person, place, and time.  Skin: Skin is warm and dry. No rash noted.  Psychiatric: She has a normal mood and affect.     ED Treatments / Results  Labs (all labs ordered are listed, but only abnormal results are displayed) Labs Reviewed  BASIC METABOLIC PANEL - Abnormal; Notable for the following components:      Result Value    Glucose, Bld 128 (*)    BUN 28 (*)    Creatinine, Ser 1.05 (*)    GFR calc non Af Amer 49 (*)    GFR calc Af Amer 57 (*)    All other components within normal limits  CBC WITH DIFFERENTIAL/PLATELET - Abnormal; Notable for the following components:   RBC 3.76 (*)    MCV 101.6 (*)    MCH 34.6 (*)    All other components within normal limits  I-STAT TROPONIN, ED    EKG  EKG Interpretation  Date/Time:  Monday March 01 2017 21:21:12 EST Ventricular Rate:  71 PR Interval:    QRS Duration: 88 QT Interval:  410 QTC Calculation: 446 R Axis:   60 Text Interpretation:  Sinus rhythm Low voltage, precordial leads since last tracing no significant change Confirmed by Rolan Bucco [phone]) on 03/01/2017 9:45:12 PM       Radiology Dg Chest 2 View  Result Date: 03/01/2017 CLINICAL DATA:  Syncope for 1 day. History of hypertension. Nonsmoker. EXAM: CHEST  2 VIEW COMPARISON:  04/03/2010 FINDINGS: Shallow inspiration with linear atelectasis in the left lung base. Mild cardiac enlargement. No vascular congestion or edema. No focal consolidation. No blunting of costophrenic angles. No pneumothorax. Mediastinal contours appear intact. Calcification of the aorta. Degenerative changes in the spine. IMPRESSION: Shallow inspiration with atelectasis in the left lung base. No evidence of active pulmonary disease. Aortic atherosclerosis. Electronically Signed   By: Burman Nieves M.D.   On: 03/01/2017 22:28    Procedures Procedures (including critical care time)  Medications Ordered in ED Medications - No data to display   Initial Impression / Assessment and Plan / ED Course  I have reviewed the triage vital signs and the nursing notes.  Pertinent labs & imaging results that were available during my care of the patient were reviewed by me and considered in my medical decision making (see chart for details).     Patient is a 82 year old female who presents after an syncopal episode.  She was  found to be in A. fib with RVR by EMS.  I did review the rhythm strips and it does look like a narrow complex atrial fibrillation with heart rates in the 130s.  She had a follow-up EKG which showed a normal sinus rhythm.  She converted spontaneously.  She is completely asymptomatic now.  I spoke with Dr. Vonzella Nipple with cardiology regarding the need for admission and he felt that since she is converted and she has no current symptoms that she can be discharged with outpatient follow-up.  He initially recommended a beta-blocker however her heart rate is currently in the 50s and I do not know what antihypertensive medication at the patient is on.  Given this, he was in agreement to hold off on the beta-blocker.  He also suggested that she can follow-up with a cardiologist to ascertain whether she needs  to be started on anticoagulants.  I did do an amatory referral to cardiology.  I encouraged her to call the office if she has not heard from them in the next couple of days.  She was given strict return precautions.  This patients CHA2DS2-VASc Score and unadjusted Ischemic Stroke Rate (% per year) is equal to 4.8 % stroke rate/year from a score of 4  Above score calculated as 1 point each if present [CHF, HTN, DM, Vascular=MI/PAD/Aortic Plaque, Age if 65-74, or Female] Above score calculated as 2 points each if present [Age > 75, or Stroke/TIA/TE]  I did find out prior to discharge the patient takes amlodipine, lisinopril and carvedilol.  Her heart rate currently is in the 80s.  She has a sinus rhythm with frequent PACs.  She does admit to drinking a large amount of caffeine.  I encouraged her to try to cut back on her caffeine intake.  Final Clinical Impressions(s) / ED Diagnoses   Final diagnoses:  Paroxysmal atrial fibrillation (HCC)  Syncope and collapse    ED Discharge Orders        Ordered    Ambulatory referral to Cardiology    Comments:  Atrial fibrillation with syncope   03/01/17 2322         Rolan Bucco, MD 03/01/17 1610    Rolan Bucco, MD 03/01/17 2342

## 2017-03-01 NOTE — ED Notes (Signed)
ED Provider at bedside. 

## 2017-03-01 NOTE — ED Notes (Signed)
Patient transported to X-ray 

## 2017-03-02 NOTE — ED Notes (Signed)
Pt discharged from ED; instructions provided; Pt encouraged to return to ED if symptoms worsen and to f/u with PCP; Pt verbalized understanding of all instructions 

## 2017-03-04 ENCOUNTER — Telehealth (HOSPITAL_COMMUNITY): Payer: Self-pay | Admitting: *Deleted

## 2017-03-04 NOTE — Telephone Encounter (Signed)
LMOM for pt to [facility]bk to schedule for an ED f/u

## 2017-03-18 ENCOUNTER — Ambulatory Visit (INDEPENDENT_AMBULATORY_CARE_PROVIDER_SITE_OTHER): Payer: Medicare Other | Admitting: Cardiology

## 2017-03-18 ENCOUNTER — Encounter: Payer: Self-pay | Admitting: Cardiology

## 2017-03-18 ENCOUNTER — Telehealth: Payer: Self-pay | Admitting: Radiology

## 2017-03-18 ENCOUNTER — Ambulatory Visit: Payer: Medicare Other

## 2017-03-18 VITALS — BP 140/84 | HR 64 | Ht 61.5 in | Wt 124.8 lb

## 2017-03-18 DIAGNOSIS — I480 Paroxysmal atrial fibrillation: Secondary | ICD-10-CM

## 2017-03-18 LAB — BASIC METABOLIC PANEL
BUN/Creatinine Ratio: 30 — ABNORMAL HIGH (ref 12–28)
BUN: 27 mg/dL (ref 8–27)
CO2: 23 mmol/L (ref 20–29)
Calcium: 10 mg/dL (ref 8.7–10.3)
Chloride: 100 mmol/L (ref 96–106)
Creatinine, Ser: 0.91 mg/dL (ref 0.57–1.00)
GFR calc non Af Amer: 60 mL/min/{1.73_m2} (ref >59)
GFR, EST AFRICAN AMERICAN: 69 mL/min/{1.73_m2} (ref >59)
Glucose: 96 mg/dL (ref 65–99)
POTASSIUM: 4.5 mmol/L (ref 3.5–5.2)
SODIUM: 140 mmol/L (ref 134–144)

## 2017-03-18 LAB — TSH: TSH: 2.64 u[IU]/mL (ref 0.450–4.500)

## 2017-03-18 MED ORDER — APIXABAN 5 MG PO TABS: 5.0000 mg | ORAL_TABLET | Freq: Two times a day (BID) | ORAL | 0 refills | Status: DC

## 2017-03-18 NOTE — Progress Notes (Signed)
03/18/2017 Gwendolyn [name]   1935/10/30  [id number]  Primary Physician Pearson Grippe, MD Primary Cardiologist: New (Dr. Okey Dupre)  Reason for Visit/CC: New Patient Evaluation for Atrial Fibrillation   HPI:  Gwendolyn [name] is a 82 y.o. female who is being seen today, as a new pt, for the evaluation of new onset atrial fibrillation at the request of Rolan Bucco, MD.  PMH includes HTN. No other cardiac risk factors. She denies h/o HLD, DM, tobacco use and no family h/o heart disease.   She was in her usual state of health until 03/01/17. She was standing her her kitchen, she she suddenly felt dizzy and lightheaded and felt like she was going to pass out. A friend who was there called EMS. Pt denies frank syncope. On EMS arrival, she was noted to be in atrial fibrillation w/ RVR in the 130s-170s and was hypotensive with SBP in the 70s. She was given IVFs and had spontaneous conversion back to NSR enroute to the ED. While in the ED, she remained in SR w/o any recurrence of afib and symptoms had resolved. BP normalized. Labs showed normal K. BMP was slightly abnormal with mildly elevated SCr and BUN (1.05/28). CBC and troponin were negative. CXR was negative. Pt was continued on home BB (Coreg 6.25 mg BID) and instructed to f/u with cardiology as an outpatient.   Pt notes she has done well since leaving the ED w/o any recurrent symptoms. No palpitations. She also denies any h/o chest pain, dyspnea or exertional symptoms. She denies any heavy caffeine intake. She "occasionally" drinks wine. No other recent illness. Denies h/o stroke/TIA.   EKG today shows NSR  HR is 64 bpm. PB is 140/84.   Current Meds  Medication Sig  . amLODipine (NORVASC) 5 MG tablet Take 5 mg by mouth daily.  Marland Kitchen aspirin EC 81 MG tablet Take 81 mg by mouth daily.  . carvedilol (COREG) 6.25 MG tablet Take 6.25 mg by mouth 2 (two) times daily with a meal.  . Cholecalciferol (VITAMIN D3) 50000 units CAPS Take 50,000 Units by mouth  once a week.  Marland Kitchen lisinopril (PRINIVIL,ZESTRIL) 40 MG tablet Take 40 mg by mouth daily.   No Known Allergies Past Medical History:  Diagnosis Date  . A-fib (HCC)   . Hypertension   . Loss of consciousness (HCC)    Family History  Problem Relation Age of Onset  . Parkinson's disease Mother   . Lung cancer Father   . Alzheimer's disease Sister   . Cirrhosis Brother   . Diabetes Sister   . Arthritis Sister    Past Surgical History:  Procedure Laterality Date  . BARTHOLIN GLAND CYST EXCISION     Social History   Socioeconomic History  . Marital status: Widowed    Spouse name: Not on file  . Number of children: Not on file  . Years of education: Not on file  . Highest education level: Not on file  Social Needs  . Financial resource strain: Not on file  . Food insecurity - worry: Not on file  . Food insecurity - inability: Not on file  . Transportation needs - medical: Not on file  . Transportation needs - non-medical: Not on file  Occupational History  . Not on file  Tobacco Use  . Smoking status: Never Smoker  . Smokeless tobacco: Never Used  Substance and Sexual Activity  . Alcohol use: Yes    Comment: occasional  . Drug use: No  . Sexual activity:  Not on file  Other Topics Concern  . Not on file  Social History Narrative  . Not on file     Review of Systems: General: negative for chills, fever, night sweats or weight changes.  Cardiovascular: negative for chest pain, dyspnea on exertion, edema, orthopnea, palpitations, paroxysmal nocturnal dyspnea or shortness of breath Dermatological: negative for rash Respiratory: negative for cough or wheezing Urologic: negative for hematuria Abdominal: negative for nausea, vomiting, diarrhea, bright red blood per rectum, melena, or hematemesis Neurologic: negative for visual changes, syncope, or dizziness All other systems reviewed and are otherwise negative except as noted above.   Physical Exam:  Blood pressure  140/84, pulse (!) 50, height 5' 1.5" (1.562 m), weight 124 lb 12.8 oz (56.6 kg).  General appearance: alert, cooperative and no distress Neck: no carotid bruit and no JVD Lungs: clear to auscultation bilaterally Heart: regular rate and rhythm, S1, S2 normal, no murmur, click, rub or gallop Extremities: extremities normal, atraumatic, no cyanosis or edema Pulses: 2+ and symmetric Skin: Skin color, texture, turgor normal. No rashes or lesions Neurologic: Grossly normal  EKG NSR 64 bpm -- personally reviewed   ASSESSMENT AND PLAN:   1. Atrial Fibrillation: newly diagnosed 03/01/17. Noted by EMS with rates in the 130s-170s with subsequent hypotension with SBPs in the 70s. Pt had spontaneous conversion back to NSR enroute to the ED and BP improved with normalization of rate and after IVF resuscitation. Labs in ED unremarkable, including K, CBC and troponin. She had mild renal insuffiencey with mild elevation in SCr and BUN (1.05/28). Pt denies any recurrent symptoms. EKG today shows NSR. She is on BB therapy for HTN and HR is well controlled in the 60s. She will need to be screened for recurrence. Will plan for 30 day outpatient monitor and will also order 2D echo to assess cardiac structure and function. Will also check TSH today.  Given a calculated CHA2DS2 VASc score of 4 for (HTN, Age >75 and HTN) we will initiate OAC for stroke risk reduction. Options were discussed with pt and we have elected to start Eliquis. Based on criteria that looks at age, weight and SCr, she currently qualifies for full dose 5 mg BID. However she will turn 82 y/o in 11/2017 and will need to reduce dose down to 2.5 mg at that time as her weight is < 60 kg. Pt instructed to stop ASA and to avoid NSAIDs for HAs/ pain. We will obtain repeat CBC in 2 weeks after starting Eliquis. W/u with Dr. Okey Dupre in 6 weeks.   Pt was seen and examined also by Dr. Okey Dupre, DOD, who agrees with asscessment and plan.  Follow-Up in 6 weeks with Dr. Okey Dupre.    Knute Neu, MHS [facility] 03/18/2017 10:28 AM

## 2017-03-18 NOTE — Patient Instructions (Addendum)
Medication Instructions:  Your physician has recommended you make the following change in your medication:   START: Eliquis 5 mg two times a day STOP: aspirin   Labwork: Today for BMET and thyroid  Your physician recommends that you return for lab work in: 2 weeks for complete blood count   Testing/Procedures: Your physician has requested that you have an echocardiogram. Echocardiography is a painless test that uses sound waves to create images of your heart. It provides your doctor with information about the size and shape of your heart and how well your heart's chambers and valves are working. This procedure takes approximately one hour. There are no restrictions for this procedure.  Your physician has recommended that you wear an event monitor. Event monitors are medical devices that record the heart's electrical activity. Doctors most often [place] these monitors to diagnose arrhythmias. Arrhythmias are problems with the speed or rhythm of the heartbeat. The monitor is a small, portable device. You can wear one while you do your normal daily activities. This is usually used to diagnose what is causing palpitations/syncope (passing out).   Follow-Up: Your physician recommends that you schedule a follow-up appointment in: 6 weeks with Dr. Okey Dupre.   Any Other Special Instructions Will Be Listed Below (If Applicable). Your provider recommends that you avoid NSAIDS like aleve and ibuprofen.    Atrial Fibrillation Atrial fibrillation is a type of heartbeat that is irregular or fast (rapid). If you have this condition, your heart keeps quivering in a weird (chaotic) way. This condition can make it so your heart cannot pump blood normally. Having this condition gives a person more risk for stroke, heart failure, and other heart problems. There are different types of atrial fibrillation. Talk with your doctor to learn about the type that you have. Follow these instructions at home:  Take  over-the-counter and prescription medicines only as told by your doctor.  If your doctor prescribed a blood-thinning medicine, take it exactly as told. Taking too much of it can cause bleeding. If you do not take enough of it, you will not have the protection that you need against stroke and other problems.  Do not use any tobacco products. These include cigarettes, chewing tobacco, and e-cigarettes. If you need help quitting, ask your doctor.  If you have apnea (obstructive sleep apnea), manage it as told by your doctor.  Do not drink alcohol.  Do not drink beverages that have caffeine. These include coffee, soda, and tea.  Maintain a healthy weight. Do not use diet pills unless your doctor says they are safe for you. Diet pills may make heart problems worse.  Follow diet instructions as told by your doctor.  Exercise regularly as told by your doctor.  Keep all follow-up visits as told by your doctor. This is important. Contact a doctor if:  You notice a change in the speed, rhythm, or strength of your heartbeat.  You are taking a blood-thinning medicine and you notice more bruising.  You get tired more easily when you move or exercise. Get help right away if:  You have pain in your chest or your belly (abdomen).  You have sweating or weakness.  You feel sick to your stomach (nauseous).  You notice blood in your throw up (vomit), poop (stool), or pee (urine).  You are short of breath.  You suddenly have swollen feet and ankles.  You feel dizzy.  Your suddenly get weak or numb in your face, arms, or legs, especially if it  happens on one side of your body.  You have trouble talking, trouble understanding, or both.  Your face or your eyelid droops on one side. These symptoms may be an emergency. Do not wait to see if the symptoms will go away. Get medical help right away. Call your local emergency services (911 in the [place] Do not drive yourself to the hospital. This  information is not intended to replace advice given to you by your health care provider. Make sure you discuss any questions you have with your health care provider. Document Released: 11/05/2007 Document Revised: 07/04/2015 Document Reviewed: 05/23/2014 Elsevier Interactive Patient Education  [employer].      If you need a refill on your cardiac medications before your next appointment, please call your pharmacy.

## 2017-03-18 NOTE — Telephone Encounter (Signed)
Patient was here to have her monitor applied. It is required by the monitor company that a land address be given. Patient refused to give a land address only had a PO box. Patient stated she was between homes and did not have a land address. Patient was given option of giving an alternative address, which she declined at this time. She did state she will start her blood thinners as doctor prescribed.

## 2017-04-01 ENCOUNTER — Other Ambulatory Visit: Payer: Self-pay

## 2017-04-01 ENCOUNTER — Ambulatory Visit (HOSPITAL_COMMUNITY): Payer: Medicare Other | Attending: Cardiology

## 2017-04-01 ENCOUNTER — Other Ambulatory Visit: Payer: Medicare Other | Admitting: *Deleted

## 2017-04-01 DIAGNOSIS — I480 Paroxysmal atrial fibrillation: Secondary | ICD-10-CM | POA: Insufficient documentation

## 2017-04-01 DIAGNOSIS — I340 Nonrheumatic mitral (valve) insufficiency: Secondary | ICD-10-CM | POA: Diagnosis not present

## 2017-04-01 DIAGNOSIS — I119 Hypertensive heart disease without heart failure: Secondary | ICD-10-CM | POA: Insufficient documentation

## 2017-04-01 LAB — CBC
Hematocrit: 37.7 % (ref 34.0–46.6)
Hemoglobin: 12.9 g/dL (ref 11.1–15.9)
MCH: 34.4 pg — ABNORMAL HIGH (ref 26.6–33.0)
MCHC: 34.2 g/dL (ref 31.5–35.7)
MCV: 101 fL — ABNORMAL HIGH (ref 79–97)
Platelets: 193 10*3/uL (ref 150–379)
RBC: 3.75 x10E6/uL — AB (ref 3.77–5.28)
RDW: 12.2 % — ABNORMAL LOW (ref 12.3–15.4)
WBC: 6.7 10*3/uL (ref 3.4–10.8)

## 2017-04-23 ENCOUNTER — Encounter: Payer: Self-pay | Admitting: *Deleted

## 2017-05-03 ENCOUNTER — Ambulatory Visit: Payer: Medicare Other | Admitting: Internal Medicine

## 2017-06-14 ENCOUNTER — Ambulatory Visit: Payer: Medicare Other | Admitting: Internal Medicine

## 2017-08-04 DIAGNOSIS — R739 Hyperglycemia, unspecified: Secondary | ICD-10-CM | POA: Diagnosis not present

## 2017-08-04 DIAGNOSIS — E559 Vitamin D deficiency, unspecified: Secondary | ICD-10-CM | POA: Diagnosis not present

## 2017-08-04 DIAGNOSIS — I10 Essential (primary) hypertension: Secondary | ICD-10-CM | POA: Diagnosis not present

## 2017-08-04 DIAGNOSIS — N390 Urinary tract infection, site not specified: Secondary | ICD-10-CM | POA: Diagnosis not present

## 2017-08-11 DIAGNOSIS — E875 Hyperkalemia: Secondary | ICD-10-CM | POA: Diagnosis not present

## 2017-08-11 DIAGNOSIS — Z0000 Encounter for general adult medical examination without abnormal findings: Secondary | ICD-10-CM | POA: Diagnosis not present

## 2017-08-11 DIAGNOSIS — R739 Hyperglycemia, unspecified: Secondary | ICD-10-CM | POA: Diagnosis not present

## 2017-08-11 DIAGNOSIS — Z23 Encounter for immunization: Secondary | ICD-10-CM | POA: Diagnosis not present

## 2017-08-11 DIAGNOSIS — E871 Hypo-osmolality and hyponatremia: Secondary | ICD-10-CM | POA: Diagnosis not present

## 2017-08-11 DIAGNOSIS — I10 Essential (primary) hypertension: Secondary | ICD-10-CM | POA: Diagnosis not present

## 2017-09-06 DIAGNOSIS — E871 Hypo-osmolality and hyponatremia: Secondary | ICD-10-CM | POA: Diagnosis not present

## 2017-09-08 DIAGNOSIS — E871 Hypo-osmolality and hyponatremia: Secondary | ICD-10-CM | POA: Diagnosis not present

## 2017-12-02 DIAGNOSIS — H2513 Age-related nuclear cataract, bilateral: Secondary | ICD-10-CM | POA: Diagnosis not present

## 2017-12-02 DIAGNOSIS — H40033 Anatomical narrow angle, bilateral: Secondary | ICD-10-CM | POA: Diagnosis not present

## 2018-02-24 DIAGNOSIS — Z23 Encounter for immunization: Secondary | ICD-10-CM | POA: Diagnosis not present

## 2018-03-03 DIAGNOSIS — E871 Hypo-osmolality and hyponatremia: Secondary | ICD-10-CM | POA: Diagnosis not present

## 2018-03-10 DIAGNOSIS — E559 Vitamin D deficiency, unspecified: Secondary | ICD-10-CM | POA: Diagnosis not present

## 2018-03-10 DIAGNOSIS — E871 Hypo-osmolality and hyponatremia: Secondary | ICD-10-CM | POA: Diagnosis not present

## 2018-03-10 DIAGNOSIS — I10 Essential (primary) hypertension: Secondary | ICD-10-CM | POA: Diagnosis not present

## 2018-08-10 DIAGNOSIS — E785 Hyperlipidemia, unspecified: Secondary | ICD-10-CM | POA: Diagnosis not present

## 2018-08-10 DIAGNOSIS — E039 Hypothyroidism, unspecified: Secondary | ICD-10-CM | POA: Diagnosis not present

## 2018-08-10 DIAGNOSIS — R739 Hyperglycemia, unspecified: Secondary | ICD-10-CM | POA: Diagnosis not present

## 2018-08-10 DIAGNOSIS — I10 Essential (primary) hypertension: Secondary | ICD-10-CM | POA: Diagnosis not present

## 2018-08-10 DIAGNOSIS — Z0000 Encounter for general adult medical examination without abnormal findings: Secondary | ICD-10-CM | POA: Diagnosis not present

## 2018-08-17 DIAGNOSIS — E559 Vitamin D deficiency, unspecified: Secondary | ICD-10-CM | POA: Diagnosis not present

## 2018-08-17 DIAGNOSIS — Z0000 Encounter for general adult medical examination without abnormal findings: Secondary | ICD-10-CM | POA: Diagnosis not present

## 2018-08-17 DIAGNOSIS — I10 Essential (primary) hypertension: Secondary | ICD-10-CM | POA: Diagnosis not present

## 2018-08-17 DIAGNOSIS — Z780 Asymptomatic menopausal state: Secondary | ICD-10-CM | POA: Diagnosis not present

## 2018-11-21 DIAGNOSIS — Z23 Encounter for immunization: Secondary | ICD-10-CM | POA: Diagnosis not present

## 2019-02-13 DIAGNOSIS — I10 Essential (primary) hypertension: Secondary | ICD-10-CM | POA: Diagnosis not present

## 2019-02-20 DIAGNOSIS — E559 Vitamin D deficiency, unspecified: Secondary | ICD-10-CM | POA: Diagnosis not present

## 2019-02-20 DIAGNOSIS — I10 Essential (primary) hypertension: Secondary | ICD-10-CM | POA: Diagnosis not present

## 2019-02-20 DIAGNOSIS — R739 Hyperglycemia, unspecified: Secondary | ICD-10-CM | POA: Diagnosis not present

## 2019-10-26 ENCOUNTER — Emergency Department (HOSPITAL_COMMUNITY): Payer: Medicare Other

## 2019-10-26 ENCOUNTER — Encounter (HOSPITAL_COMMUNITY): Payer: Self-pay | Admitting: Emergency Medicine

## 2019-10-26 ENCOUNTER — Emergency Department (HOSPITAL_COMMUNITY)
Admission: EM | Admit: 2019-10-26 | Discharge: 2019-10-26 | Disposition: A | Discharge status: Home / Self Care | Payer: Medicare Other | Attending: Emergency Medicine | Admitting: Emergency Medicine

## 2019-10-26 DIAGNOSIS — R410 Disorientation, unspecified: Secondary | ICD-10-CM | POA: Insufficient documentation

## 2019-10-26 DIAGNOSIS — Y999 Unspecified external cause status: Secondary | ICD-10-CM | POA: Insufficient documentation

## 2019-10-26 DIAGNOSIS — S199XXA Unspecified injury of neck, initial encounter: Secondary | ICD-10-CM | POA: Diagnosis not present

## 2019-10-26 DIAGNOSIS — S0990XA Unspecified injury of head, initial encounter: Secondary | ICD-10-CM | POA: Diagnosis not present

## 2019-10-26 DIAGNOSIS — S0012XA Contusion of left eyelid and periocular area, initial encounter: Secondary | ICD-10-CM | POA: Insufficient documentation

## 2019-10-26 DIAGNOSIS — R4182 Altered mental status, unspecified: Secondary | ICD-10-CM | POA: Diagnosis not present

## 2019-10-26 DIAGNOSIS — R220 Localized swelling, mass and lump, head: Secondary | ICD-10-CM | POA: Diagnosis not present

## 2019-10-26 DIAGNOSIS — R519 Headache, unspecified: Secondary | ICD-10-CM | POA: Diagnosis not present

## 2019-10-26 DIAGNOSIS — Y92480 Sidewalk as the place of occurrence of the external cause: Secondary | ICD-10-CM | POA: Diagnosis not present

## 2019-10-26 DIAGNOSIS — W19XXXA Unspecified fall, initial encounter: Secondary | ICD-10-CM | POA: Diagnosis not present

## 2019-10-26 DIAGNOSIS — S00202A Unspecified superficial injury of left eyelid and periocular area, initial encounter: Secondary | ICD-10-CM | POA: Diagnosis present

## 2019-10-26 DIAGNOSIS — I700 Atherosclerosis of aorta: Secondary | ICD-10-CM | POA: Diagnosis not present

## 2019-10-26 DIAGNOSIS — H05232 Hemorrhage of left orbit: Secondary | ICD-10-CM

## 2019-10-26 DIAGNOSIS — S0993XA Unspecified injury of face, initial encounter: Secondary | ICD-10-CM | POA: Diagnosis not present

## 2019-10-26 DIAGNOSIS — I10 Essential (primary) hypertension: Secondary | ICD-10-CM | POA: Diagnosis not present

## 2019-10-26 DIAGNOSIS — S299XXA Unspecified injury of thorax, initial encounter: Secondary | ICD-10-CM | POA: Diagnosis not present

## 2019-10-26 HISTORY — DX: Unspecified atrial fibrillation: I48.91

## 2019-10-26 HISTORY — DX: Syncope and collapse: R55

## 2019-10-26 LAB — I-STAT VENOUS BLOOD GAS, ED
Acid-Base Excess: 2 mmol/L (ref 0.0–2.0)
Bicarbonate: 25.8 mmol/L (ref 20.0–28.0)
Calcium, Ion: 1.09 mmol/L — ABNORMAL LOW (ref 1.15–1.40)
HCT: 40 % (ref 36.0–46.0)
Hemoglobin: 13.6 g/dL (ref 12.0–15.0)
O2 Saturation: 75 %
Potassium: 4.9 mmol/L (ref 3.5–5.1)
Sodium: 132 mmol/L — ABNORMAL LOW (ref 135–145)
TCO2: 27 mmol/L (ref 22–32)
pCO2, Ven: 36.5 mmHg — ABNORMAL LOW (ref 44.0–60.0)
pH, Ven: 7.456 — ABNORMAL HIGH (ref 7.250–7.430)
pO2, Ven: 38 mmHg (ref 32.0–45.0)

## 2019-10-26 LAB — URINALYSIS, ROUTINE W REFLEX MICROSCOPIC
Bilirubin Urine: NEGATIVE
Glucose, UA: NEGATIVE mg/dL
Hgb urine dipstick: NEGATIVE
Ketones, ur: 20 mg/dL — AB
Leukocytes,Ua: NEGATIVE
Nitrite: NEGATIVE
Protein, ur: NEGATIVE mg/dL
Specific Gravity, Urine: 1.01 (ref 1.005–1.030)
pH: 6 (ref 5.0–8.0)

## 2019-10-26 LAB — ETHANOL: Alcohol, Ethyl (B): <10 mg/dL (ref <10)

## 2019-10-26 LAB — I-STAT CHEM 8, ED
BUN: 29 mg/dL — ABNORMAL HIGH (ref 8–23)
Calcium, Ion: 1.11 mmol/L — ABNORMAL LOW (ref 1.15–1.40)
Chloride: 99 mmol/L (ref 98–111)
Creatinine, Ser: 0.8 mg/dL (ref 0.44–1.00)
Glucose, Bld: 128 mg/dL — ABNORMAL HIGH (ref 70–99)
HCT: 38 % (ref 36.0–46.0)
Hemoglobin: 12.9 g/dL (ref 12.0–15.0)
Potassium: 4.9 mmol/L (ref 3.5–5.1)
Sodium: 131 mmol/L — ABNORMAL LOW (ref 135–145)
TCO2: 24 mmol/L (ref 22–32)

## 2019-10-26 LAB — CBC
HCT: 36.9 % (ref 36.0–46.0)
Hemoglobin: 12.5 g/dL (ref 12.0–15.0)
MCH: 35.9 pg — ABNORMAL HIGH (ref 26.0–34.0)
MCHC: 33.9 g/dL (ref 30.0–36.0)
MCV: 106 fL — ABNORMAL HIGH (ref 80.0–100.0)
Platelets: 179 10*3/uL (ref 150–400)
RBC: 3.48 MIL/uL — ABNORMAL LOW (ref 3.87–5.11)
RDW: 12 % (ref 11.5–15.5)
WBC: 7.1 10*3/uL (ref 4.0–10.5)
nRBC: 0 % (ref 0.0–0.2)

## 2019-10-26 LAB — COMPREHENSIVE METABOLIC PANEL
ALT: 30 U/L (ref 0–44)
AST: 39 U/L (ref 15–41)
Albumin: 4.3 g/dL (ref 3.5–5.0)
Alkaline Phosphatase: 75 U/L (ref 38–126)
Anion gap: 15 (ref 5–15)
BUN: 23 mg/dL (ref 8–23)
CO2: 21 mmol/L — ABNORMAL LOW (ref 22–32)
Calcium: 9.6 mg/dL (ref 8.9–10.3)
Chloride: 95 mmol/L — ABNORMAL LOW (ref 98–111)
Creatinine, Ser: 0.94 mg/dL (ref 0.44–1.00)
GFR calc Af Amer: >60 mL/min (ref >60)
GFR calc non Af Amer: 57 mL/min — ABNORMAL LOW (ref >60)
Glucose, Bld: 128 mg/dL — ABNORMAL HIGH (ref 70–99)
Potassium: 4.6 mmol/L (ref 3.5–5.1)
Sodium: 131 mmol/L — ABNORMAL LOW (ref 135–145)
Total Bilirubin: 0.9 mg/dL (ref 0.3–1.2)
Total Protein: 7.3 g/dL (ref 6.5–8.1)

## 2019-10-26 LAB — SAMPLE TO BLOOD BANK

## 2019-10-26 LAB — PROTIME-INR
INR: 1 (ref 0.8–1.2)
Prothrombin Time: 13.1 seconds (ref 11.4–15.2)

## 2019-10-26 LAB — AMMONIA: Ammonia: 12 umol/L (ref 9–35)

## 2019-10-26 LAB — LACTIC ACID, PLASMA: Lactic Acid, Venous: 1.6 mmol/L (ref 0.5–1.9)

## 2019-10-26 NOTE — ED Provider Notes (Signed)
[facility] EMERGENCY DEPARTMENT Provider Note   CSN: [id number] Arrival date & time: 10/26/19  1129     History Chief Complaint  Patient presents with  . Fall    Gwendolyn [name] is a 84 y.o. female.  84 yo F with a chief complaint of a fall.  Please found her laying face first on the ground and called EMS.  Thought to be tripped over a change in the sidewalk by EMS.  Patient is somewhat confused.  She thinks that she may be fell but she is not sure.  Does not think that she passed out.  Denies any chest pain or shortness of breath denies abdominal pain.  Has pain to the left side of her face.  Denies visual complaints.  Denies double vision.  The history is provided by the patient.  Fall This is a new problem. The current episode started less than 1 hour ago. The problem occurs constantly. The problem has not changed since onset.Associated symptoms include headaches. Pertinent negatives include no chest pain, no abdominal pain and no shortness of breath. Nothing aggravates the symptoms. Nothing relieves the symptoms. She has tried nothing for the symptoms. The treatment provided no relief.       Past Medical History:  Diagnosis Date  . Atrial fibrillation (HCC)   . Syncope     There are no problems to display for this patient.   Past Surgical History:  Procedure Laterality Date  . BARTHOLIN GLAND CYST EXCISION       OB History   No obstetric history on file.     No family history on file.  Social History   Tobacco Use  . Smoking status: Never Smoker  . Smokeless tobacco: Never Used  Substance Use Topics  . Alcohol use: Yes  . Drug use: Never    Home Medications Prior to Admission medications   Not on File    Allergies    Patient has no known allergies.  Review of Systems   Review of Systems  Constitutional: Negative for chills and fever.  HENT: Negative for congestion and rhinorrhea.   Eyes: Negative for redness and visual  disturbance.  Respiratory: Negative for shortness of breath and wheezing.   Cardiovascular: Negative for chest pain and palpitations.  Gastrointestinal: Negative for abdominal pain, nausea and vomiting.  Genitourinary: Negative for dysuria and urgency.  Musculoskeletal: Negative for arthralgias and myalgias.  Skin: Negative for pallor and wound.  Neurological: Positive for headaches. Negative for dizziness.    Physical Exam Updated Vital Signs BP 136/75   Pulse 77   Temp 97.8 F (36.6 C) (Oral)   Resp 19   Ht 5\' 1"  (1.549 m)   Wt 59 kg   SpO2 96%   BMI 24.56 kg/m   Physical Exam Vitals and nursing note reviewed.  Constitutional:      General: She is not in acute distress.    Appearance: She is well-developed. She is not diaphoretic.  HENT:     Head: Normocephalic.     Comments: Left periorbital hematoma.  Extraocular motion intact. Eyes:     Pupils: Pupils are equal, round, and reactive to light.  Cardiovascular:     Rate and Rhythm: Normal rate and regular rhythm.     Heart sounds: No murmur heard.  No friction rub. No gallop.   Pulmonary:     Effort: Pulmonary effort is normal.     Breath sounds: No wheezing or rales.  Abdominal:  General: There is no distension.     Palpations: Abdomen is soft.     Tenderness: There is no abdominal tenderness.  Musculoskeletal:        General: No tenderness.     Cervical back: Normal range of motion and neck supple.  Skin:    General: Skin is warm and dry.  Neurological:     Mental Status: She is alert.     Comments: Confused.  Other than left upper extremity weakness that improved with increased effort no finding on neuro exam.  Psychiatric:        Behavior: Behavior normal.     ED Results / Procedures / Treatments   Labs (all labs ordered are listed, but only abnormal results are displayed) Labs Reviewed  COMPREHENSIVE METABOLIC PANEL - Abnormal; Notable for the following components:      Result Value   Sodium  131 (*)    Chloride 95 (*)    CO2 21 (*)    Glucose, Bld 128 (*)    GFR calc non Af Amer 57 (*)    All other components within normal limits  CBC - Abnormal; Notable for the following components:   RBC 3.48 (*)    MCV 106.0 (*)    MCH 35.9 (*)    All other components within normal limits  URINALYSIS, ROUTINE W REFLEX MICROSCOPIC - Abnormal; Notable for the following components:   Ketones, ur 20 (*)    All other components within normal limits  I-STAT CHEM 8, ED - Abnormal; Notable for the following components:   Sodium 131 (*)    BUN 29 (*)    Glucose, Bld 128 (*)    Calcium, Ion 1.11 (*)    All other components within normal limits  I-STAT VENOUS BLOOD GAS, ED - Abnormal; Notable for the following components:   pH, Ven 7.456 (*)    pCO2, Ven 36.5 (*)    Sodium 132 (*)    Calcium, Ion 1.09 (*)    All other components within normal limits  ETHANOL  LACTIC ACID, PLASMA  PROTIME-INR  AMMONIA  SAMPLE TO BLOOD BANK    EKG EKG Interpretation  Date/Time:  Thursday October 26 2019 12:28:54 EDT Ventricular Rate:  78 PR Interval:    QRS Duration: 103 QT Interval:  417 QTC Calculation: 475 R Axis:   53 Text Interpretation: Sinus or ectopic atrial rhythm Atrial premature complex Low voltage, precordial leads Borderline T abnormalities, anterior leads No old tracing to compare Confirmed by Melene Plan [phone]) on 10/26/2019 12:31:34 PM   Radiology CT HEAD WO CONTRAST  Result Date: 10/26/2019 CLINICAL DATA:  Fall.  Facial trauma. EXAM: CT HEAD WITHOUT CONTRAST CT MAXILLOFACIAL WITHOUT CONTRAST CT CERVICAL SPINE WITHOUT CONTRAST TECHNIQUE: Multidetector CT imaging of the head, cervical spine, and maxillofacial structures were performed using the standard protocol without intravenous contrast. Multiplanar CT image reconstructions of the cervical spine and maxillofacial structures were also generated. COMPARISON:  None. FINDINGS: CT HEAD FINDINGS Brain: Mild atrophy. Negative for  hydrocephalus. Diffuse white matter hypodensity bilaterally appears chronic. Negative for acute infarct, hemorrhage, mass. Vascular: Negative for hyperdense vessel Skull: Negative Other: Paranasal sinuses clear. CT MAXILLOFACIAL FINDINGS Osseous: Negative for facial fracture Orbits: No mass or edema in the orbit. Sinuses: Clear Soft tissues: Prominent soft tissue swelling overlying the left zygoma compatible with soft tissue hematoma. CT CERVICAL SPINE FINDINGS Alignment: Normal Skull base and vertebrae: Negative for fracture Soft tissues and spinal canal: Negative for soft tissue mass or edema.  Disc levels: Multilevel disc and facet degeneration throughout the cervical spine. No significant spinal stenosis. Upper chest: Lung apices clear bilaterally. Azygos lobe fissure noted. Other: None IMPRESSION: 1. No acute intracranial abnormality. Atrophy and chronic microvascular ischemic change in the white matter. 2. Negative for facial fracture. Soft tissue swelling left zygoma compatible with hematoma. 3. Negative for cervical spine fracture. Electronically Signed   By: Marlan Palau M.D.   On: 10/26/2019 12:01   CT CERVICAL SPINE WO CONTRAST  Result Date: 10/26/2019 CLINICAL DATA:  Fall.  Facial trauma. EXAM: CT HEAD WITHOUT CONTRAST CT MAXILLOFACIAL WITHOUT CONTRAST CT CERVICAL SPINE WITHOUT CONTRAST TECHNIQUE: Multidetector CT imaging of the head, cervical spine, and maxillofacial structures were performed using the standard protocol without intravenous contrast. Multiplanar CT image reconstructions of the cervical spine and maxillofacial structures were also generated. COMPARISON:  None. FINDINGS: CT HEAD FINDINGS Brain: Mild atrophy. Negative for hydrocephalus. Diffuse white matter hypodensity bilaterally appears chronic. Negative for acute infarct, hemorrhage, mass. Vascular: Negative for hyperdense vessel Skull: Negative Other: Paranasal sinuses clear. CT MAXILLOFACIAL FINDINGS Osseous: Negative for facial  fracture Orbits: No mass or edema in the orbit. Sinuses: Clear Soft tissues: Prominent soft tissue swelling overlying the left zygoma compatible with soft tissue hematoma. CT CERVICAL SPINE FINDINGS Alignment: Normal Skull base and vertebrae: Negative for fracture Soft tissues and spinal canal: Negative for soft tissue mass or edema. Disc levels: Multilevel disc and facet degeneration throughout the cervical spine. No significant spinal stenosis. Upper chest: Lung apices clear bilaterally. Azygos lobe fissure noted. Other: None IMPRESSION: 1. No acute intracranial abnormality. Atrophy and chronic microvascular ischemic change in the white matter. 2. Negative for facial fracture. Soft tissue swelling left zygoma compatible with hematoma. 3. Negative for cervical spine fracture. Electronically Signed   By: Marlan Palau M.D.   On: 10/26/2019 12:01   DG Chest Port 1 View  Result Date: 10/26/2019 CLINICAL DATA:  Fall.  Face injury. EXAM: PORTABLE CHEST 1 VIEW COMPARISON:  03/01/2017 FINDINGS: Cardiac and mediastinal contours normal. Vascularity normal. Negative for heart failure. Blunting of the left costophrenic angle unchanged compatible with chronic scarring. No effusion identified. Atherosclerotic aorta. IMPRESSION: No active disease. Electronically Signed   By: Marlan Palau M.D.   On: 10/26/2019 11:46   CT MAXILLOFACIAL WO CONTRAST  Result Date: 10/26/2019 CLINICAL DATA:  Fall.  Facial trauma. EXAM: CT HEAD WITHOUT CONTRAST CT MAXILLOFACIAL WITHOUT CONTRAST CT CERVICAL SPINE WITHOUT CONTRAST TECHNIQUE: Multidetector CT imaging of the head, cervical spine, and maxillofacial structures were performed using the standard protocol without intravenous contrast. Multiplanar CT image reconstructions of the cervical spine and maxillofacial structures were also generated. COMPARISON:  None. FINDINGS: CT HEAD FINDINGS Brain: Mild atrophy. Negative for hydrocephalus. Diffuse white matter hypodensity bilaterally  appears chronic. Negative for acute infarct, hemorrhage, mass. Vascular: Negative for hyperdense vessel Skull: Negative Other: Paranasal sinuses clear. CT MAXILLOFACIAL FINDINGS Osseous: Negative for facial fracture Orbits: No mass or edema in the orbit. Sinuses: Clear Soft tissues: Prominent soft tissue swelling overlying the left zygoma compatible with soft tissue hematoma. CT CERVICAL SPINE FINDINGS Alignment: Normal Skull base and vertebrae: Negative for fracture Soft tissues and spinal canal: Negative for soft tissue mass or edema. Disc levels: Multilevel disc and facet degeneration throughout the cervical spine. No significant spinal stenosis. Upper chest: Lung apices clear bilaterally. Azygos lobe fissure noted. Other: None IMPRESSION: 1. No acute intracranial abnormality. Atrophy and chronic microvascular ischemic change in the white matter. 2. Negative for facial fracture. Soft tissue swelling  left zygoma compatible with hematoma. 3. Negative for cervical spine fracture. Electronically Signed   By: Marlan Palau M.D.   On: 10/26/2019 12:01    Procedures Procedures (including critical care time)  Medications Ordered in ED Medications - No data to display  ED Course  I have reviewed the triage vital signs and the nursing notes.  Pertinent labs & imaging results that were available during my care of the patient were reviewed by me and considered in my medical decision making (see chart for details).    MDM Rules/Calculators/A&P                          84 yo F with a chief complaint of a fall.  Patient thinks that she lost her balance though she is somewhat confused currently.  Will obtain a laboratory evaluation CT of the head face and neck.  Reassess.  The patient was triaged and entered as a new patient in the system though the patient's old medical record is available MRN is [id number].  She is on Eliquis per her old chart.  CT without acute intercranial injury, no c spine injury, no  facial fracture.   Ambulating about the ED without issue.  Mental status improved significantly.  Requesting D/c.  PCP follow up.   3:04 PM:  I have discussed the diagnosis/risks/treatment options with the patient and believe the pt to be eligible for discharge home to follow-up with PCP. We also discussed returning to the ED immediately if new or worsening sx occur. We discussed the sx which are most concerning (e.g., sudden worsening pain, fever, inability to tolerate by mouth) that necessitate immediate return. Medications administered to the patient during their visit and any new prescriptions provided to the patient are listed below.  Medications given during this visit Medications - No data to display   The patient appears reasonably screen and/or stabilized for discharge and I doubt any other medical condition or other [facility] requiring further screening, evaluation, or treatment in the ED at this time prior to discharge.     Final Clinical Impression(s) / ED Diagnoses Final diagnoses:  Fall  Periorbital hematoma of left eye  Confusion    Rx / DC Orders ED Discharge Orders    None       Melene Plan, DO 10/26/19 1504

## 2019-10-26 NOTE — Discharge Instructions (Signed)
Return for worsening confusion, headache, difficulty moving one side of your body.  Follow up with your family doc.

## 2019-10-26 NOTE — Progress Notes (Signed)
Orthopedic Tech Progress Note Patient Details:  Gwendolyn [name] Jun 20, 1935 [id number] Level 2 Trauma. Not needed Patient ID: Gwendolyn [name], female   DOB: 1935-11-27, 83 y.o.   MRN: [id number]   Lovett Calender 10/26/2019, 12:56 PM

## 2020-02-09 ENCOUNTER — Encounter (HOSPITAL_COMMUNITY): Payer: Self-pay | Admitting: Emergency Medicine

## 2020-02-09 ENCOUNTER — Emergency Department (HOSPITAL_COMMUNITY): Payer: Medicare Other

## 2020-02-09 ENCOUNTER — Emergency Department (HOSPITAL_COMMUNITY)
Admission: EM | Admit: 2020-02-09 | Discharge: 2020-02-09 | Disposition: A | Discharge status: Home / Self Care | Payer: Medicare Other | Attending: Emergency Medicine | Admitting: Emergency Medicine

## 2020-02-09 DIAGNOSIS — S40011A Contusion of right shoulder, initial encounter: Secondary | ICD-10-CM | POA: Diagnosis not present

## 2020-02-09 DIAGNOSIS — S0181XA Laceration without foreign body of other part of head, initial encounter: Secondary | ICD-10-CM

## 2020-02-09 DIAGNOSIS — Z23 Encounter for immunization: Secondary | ICD-10-CM | POA: Insufficient documentation

## 2020-02-09 DIAGNOSIS — S0101XA Laceration without foreign body of scalp, initial encounter: Secondary | ICD-10-CM | POA: Diagnosis not present

## 2020-02-09 DIAGNOSIS — S0990XA Unspecified injury of head, initial encounter: Secondary | ICD-10-CM | POA: Diagnosis present

## 2020-02-09 DIAGNOSIS — S42291A Other displaced fracture of upper end of right humerus, initial encounter for closed fracture: Secondary | ICD-10-CM

## 2020-02-09 DIAGNOSIS — Z7901 Long term (current) use of anticoagulants: Secondary | ICD-10-CM | POA: Diagnosis not present

## 2020-02-09 DIAGNOSIS — R52 Pain, unspecified: Secondary | ICD-10-CM | POA: Diagnosis not present

## 2020-02-09 DIAGNOSIS — M79603 Pain in arm, unspecified: Secondary | ICD-10-CM | POA: Diagnosis not present

## 2020-02-09 DIAGNOSIS — I10 Essential (primary) hypertension: Secondary | ICD-10-CM | POA: Insufficient documentation

## 2020-02-09 DIAGNOSIS — W01198A Fall on same level from slipping, tripping and stumbling with subsequent striking against other object, initial encounter: Secondary | ICD-10-CM | POA: Diagnosis not present

## 2020-02-09 DIAGNOSIS — S42201A Unspecified fracture of upper end of right humerus, initial encounter for closed fracture: Secondary | ICD-10-CM | POA: Diagnosis not present

## 2020-02-09 DIAGNOSIS — R000 Tachycardia, unspecified: Secondary | ICD-10-CM | POA: Diagnosis not present

## 2020-02-09 DIAGNOSIS — Z79899 Other long term (current) drug therapy: Secondary | ICD-10-CM | POA: Diagnosis not present

## 2020-02-09 DIAGNOSIS — T07XXXA Unspecified multiple injuries, initial encounter: Secondary | ICD-10-CM | POA: Diagnosis not present

## 2020-02-09 DIAGNOSIS — R58 Hemorrhage, not elsewhere classified: Secondary | ICD-10-CM | POA: Diagnosis not present

## 2020-02-09 DIAGNOSIS — W19XXXA Unspecified fall, initial encounter: Secondary | ICD-10-CM

## 2020-02-09 MED ORDER — OXYCODONE-ACETAMINOPHEN 5-325 MG PO TABS: 1.0000 | ORAL_TABLET | Freq: Four times a day (QID) | ORAL | 0 refills | Status: DC | PRN

## 2020-02-09 MED ORDER — TETANUS-DIPHTH-ACELL PERTUSSIS 5-2.5-18.5 LF-MCG/0.5 IM SUSY
0.5000 mL | PREFILLED_SYRINGE | Freq: Once | INTRAMUSCULAR | Status: AC
  Administered 2020-02-09: 0.5 mL via INTRAMUSCULAR
  Filled 2020-02-09: qty 0.5

## 2020-02-09 MED ORDER — ONDANSETRON 4 MG PO TBDP
4.0000 mg | ORAL_TABLET | Freq: Once | ORAL | Status: AC
  Administered 2020-02-09: 4 mg via ORAL
  Filled 2020-02-09: qty 1

## 2020-02-09 MED ORDER — OXYCODONE-ACETAMINOPHEN 5-325 MG PO TABS
1.0000 | ORAL_TABLET | Freq: Once | ORAL | Status: AC
  Administered 2020-02-09: 1 via ORAL
  Filled 2020-02-09: qty 1

## 2020-02-09 MED ORDER — LIDOCAINE HCL 2 % IJ SOLN
10.0000 mL | Freq: Once | INTRAMUSCULAR | Status: AC
  Administered 2020-02-09: 200 mg via INTRADERMAL
  Filled 2020-02-09: qty 20

## 2020-02-09 NOTE — ED Notes (Addendum)
Spoke with pts friend, Lucendia Herrlich, and updated her on the pts care and discharge dispo. Lucendia Herrlich states she will pick the pt up.

## 2020-02-09 NOTE — ED Triage Notes (Addendum)
Per EMS-fell yesterday at a store-no LOC, no blood thinners-bruise and closed laceration above right eye-patient complaining of right arm pain-states she has been unable to get up out of her chair at home

## 2020-02-09 NOTE — ED Provider Notes (Signed)
[facility]-EMERGENCY DEPT Provider Note   CSN: [id number] Arrival date & time: 02/09/20  1348     History Chief Complaint  Patient presents with  . Fall    Gwendolyn [name] is a 84 y.o. female history of A. fib but not on blood thinners, hypertension, here presenting with fall.  She states that she was at [employer] and had a trip and fell and hit her forehead and her right shoulder.  This happened sometime yesterday morning.  She states that she has progressive right shoulder pain and has bleeding from her forehead wound.  Initially she called EMS and did not want to come in for evaluation.  She states that since she has progressive pain and bleeding she decided to come in for evaluation.  She is on baby aspirin and no blood thinners.   The history is provided by the patient.       Past Medical History:  Diagnosis Date  . A-fib (HCC)   . Atrial fibrillation (HCC)   . Hypertension   . Loss of consciousness (HCC)   . Syncope     There are no problems to display for this patient.   Past Surgical History:  Procedure Laterality Date  . BARTHOLIN GLAND CYST EXCISION       OB History   No obstetric history on file.     Family History  Problem Relation Age of Onset  . Parkinson's disease Mother   . Lung cancer Father   . Alzheimer's disease Sister   . Cirrhosis Brother   . Diabetes Sister   . Arthritis Sister     Social History   Tobacco Use  . Smoking status: Never Smoker  . Smokeless tobacco: Never Used  Vaping Use  . Vaping Use: Never used  Substance Use Topics  . Alcohol use: Yes    Comment: occasional  . Drug use: Never    Home Medications Prior to Admission medications   Medication Sig Start Date End Date Taking? Authorizing Provider  amLODipine (NORVASC) 5 MG tablet Take 5 mg by mouth daily.    [provider]  apixaban (ELIQUIS) 5 MG TABS tablet Take 1 tablet (5 mg total) by mouth 2 (two) times daily. 03/18/17 06/16/17   Robbie Lis M, PA-C  carvedilol (COREG) 6.25 MG tablet Take 6.25 mg by mouth 2 (two) times daily with a meal.    [provider]  Cholecalciferol (VITAMIN D3) 50000 units CAPS Take 50,000 Units by mouth once a week.    [provider]  lisinopril (PRINIVIL,ZESTRIL) 40 MG tablet Take 40 mg by mouth daily.    [provider]    Allergies    Patient has no known allergies.  Review of Systems   Review of Systems  Skin: Positive for wound.  All other systems reviewed and are negative.   Physical Exam Updated Vital Signs BP (!) 153/81   Pulse 76   Temp 98.8 F (37.1 C) (Oral)   Resp 16   SpO2 100%   Physical Exam Vitals and nursing note reviewed.  HENT:     Head:     Comments: 2 1 cm laceration R temporal area in a V shape. No eyebrow involvement     Nose: Nose normal.     Mouth/Throat:     Mouth: Mucous membranes are moist.  Eyes:     Extraocular Movements: Extraocular movements intact.     Pupils: Pupils are equal, round, and reactive to light.  Cardiovascular:     Rate and Rhythm: Normal rate and regular rhythm.     Pulses: Normal pulses.     Heart sounds: Normal heart sounds.  Pulmonary:     Effort: Pulmonary effort is normal.     Breath sounds: Normal breath sounds.  Abdominal:     General: Abdomen is flat.     Palpations: Abdomen is soft.  Musculoskeletal:     Cervical back: Normal range of motion and neck supple.     Comments: Ecchymosis of the right shoulder and goes down to the mid humerus.  No rib tenderness.  No spinal tenderness.  Normal range of motion bilateral hips.  No other extremity trauma  Skin:    General: Skin is warm.     Capillary Refill: Capillary refill takes less than 2 seconds.  Neurological:     General: No focal deficit present.     Mental Status: She is alert and oriented to person, place, and time.  Psychiatric:        Mood and Affect: Mood normal.        Behavior: Behavior normal.     ED Results /  Procedures / Treatments   Labs (all labs ordered are listed, but only abnormal results are displayed) Labs Reviewed - No data to display  EKG None  Radiology CT Head Wo Contrast  Result Date: 02/09/2020 CLINICAL DATA:  Larey Seat yesterday, right supraorbital laceration and bruising, right arm pain EXAM: CT HEAD WITHOUT CONTRAST TECHNIQUE: Contiguous axial images were obtained from the base of the skull through the vertex without intravenous contrast. COMPARISON:  10/26/2019 FINDINGS: Brain: Hypodensities throughout the periventricular white matter and bilateral basal ganglia are stable, consistent with chronic small vessel ischemic change. No signs of acute infarct or hemorrhage. Lateral ventricles and midline structures are unremarkable. No acute extra-axial fluid collections. No mass effect. Vascular: Stable atherosclerosis.  No hyperdense vessel. Skull: Right supraorbital scalp laceration. No underlying fracture. The remainder of the calvarium is unremarkable. Sinuses/Orbits: No acute finding. Other: None. IMPRESSION: 1. No acute intracranial process. 2. Chronic small vessel ischemic change. 3. Right supraorbital scalp laceration. Electronically Signed   By: Sharlet Salina M.D.   On: 02/09/2020 16:06   DG Shoulder Right Port  Result Date: 02/09/2020 CLINICAL DATA:  Larey Seat yesterday at a store.  Right arm/shoulder pain. EXAM: PORTABLE RIGHT SHOULDER COMPARISON:  None. FINDINGS: Comminuted fracture of the proximal right humerus. There is a transverse fracture across the proximal humeral metaphysis with comminuted fractures of the greater tuberosity and underlying humeral head. The humeral head is subluxed inferiorly in relation to the glenoid, but not dislocated. No significant fracture displacement or angulation. No other fractures.  AC joint normally aligned. IMPRESSION: 1. Comminuted, but essentially nondisplaced and nonangulated fracture the proximal humerus. No dislocation. Electronically Signed    By: Amie Portland M.D.   On: 02/09/2020 14:36   DG Humerus Right  Result Date: 02/09/2020 CLINICAL DATA:  Right upper arm pain since a fall yesterday. Initial encounter. EXAM: RIGHT HUMERUS - 2+ VIEW COMPARISON:  None. FINDINGS: Proximal humerus fracture is identified as seen on dedicated plain films of the right shoulder. No other abnormality is seen. IMPRESSION: Acute proximal right humerus fracture as seen on dedicated plain films of the shoulder today. The exam is otherwise negative. Electronically Signed   By: Drusilla Kanner M.D.   On: 02/09/2020 14:35   DG Hip Unilat W or Wo Pelvis 2-3 Views Right  Result Date: 02/09/2020 CLINICAL DATA:  Larey Seat  onto right side yesterday EXAM: DG HIP (WITH OR WITHOUT PELVIS) 2-3V RIGHT COMPARISON:  None. FINDINGS: Frontal view of the pelvis as well as frontal and frogleg lateral views of the right hip are obtained. There are no acute displaced fractures. Joint spaces are well preserved. Mild enthesopathic changes at the right greater trochanter. The sacroiliac joints are normal. Soft tissues are unremarkable. IMPRESSION: 1. No acute bony abnormality. Electronically Signed   By: Sharlet Salina M.D.   On: 02/09/2020 15:58    Procedures Procedures (including critical care time)  LACERATION REPAIR Performed by: Richardean Canal Authorized by: Richardean Canal Consent: Verbal consent obtained. Risks and benefits: risks, benefits and alternatives were discussed Consent given by: patient Patient identity confirmed: provided demographic data Prepped and Draped in normal sterile fashion Wound explored  Laceration Location: R temporal area   Laceration Length: 2 cm  No Foreign Bodies seen or palpated  Anesthesia: local infiltration  Local anesthetic: lidocaine 2 % no epinephrine  Anesthetic total: 5 ml  Irrigation method: syringe Amount of cleaning: standard  Skin closure: 5-0 ethilon  Number of sutures: 3  Technique:  Simple interrupted   Patient  tolerance: Patient tolerated the procedure well with no immediate complications.   Medications Ordered in ED Medications  oxyCODONE-acetaminophen (PERCOCET/ROXICET) 5-325 MG per tablet 1 tablet (1 tablet Oral Given 02/09/20 1954)  Tdap (BOOSTRIX) injection 0.5 mL (0.5 mLs Intramuscular Given 02/09/20 1956)  lidocaine (XYLOCAINE) 2 % (with pres) injection 200 mg (200 mg Intradermal Given by Other 02/09/20 1957)  ondansetron (ZOFRAN-ODT) disintegrating tablet 4 mg (4 mg Oral Given 02/09/20 1955)    ED Course  I have reviewed the triage vital signs and the nursing notes.  Pertinent labs & imaging results that were available during my care of the patient were reviewed by me and considered in my medical decision making (see chart for details).    MDM Rules/Calculators/A&P                          Gwendolyn [name] is a 84 y.o. female here presenting with fall with right facial laceration. Laceration is around 24 hours old now but appears clean.  CT head unremarkable.  I loosely approximated the wound after extensive irrigation.  Patient does have a proximal humerus fracture.  Given sling and pain medicine.  Will refer to Ortho outpatient and have her return in 7 days for suture removal   Final Clinical Impression(s) / ED Diagnoses Final diagnoses:  Fall    Rx / DC Orders ED Discharge Orders    None       Charlynne Pander, MD 02/09/20 2029

## 2020-02-09 NOTE — Discharge Instructions (Signed)
You have a laceration that was sutured.  You need suture removal in a week.  Keep the wound clean and dry and you may take out the dressing tomorrow  You also have a arm fracture.  Please keep yourself in the sling until you see orthopedic doctor.  You need to see orthopedic doctor in a week.  Return to ER if you have fever or purulent drainage from the wound, uncontrolled pain

## 2020-02-09 NOTE — Progress Notes (Signed)
Orthopedic Tech Progress Note Patient Details:  Gwendolyn [name] 09-14-1935 [id number]  Ortho Devices Ortho Device/Splint Location: applied shoulder immobilizer to RUE Ortho Device/Splint Interventions: Ordered,Application   Post Interventions Patient Tolerated: Well Instructions Provided: Care of device   Jennye Moccasin 02/09/2020, 8:04 PM

## 2020-02-12 ENCOUNTER — Encounter (HOSPITAL_COMMUNITY): Payer: Self-pay | Admitting: Emergency Medicine

## 2020-02-12 ENCOUNTER — Emergency Department (HOSPITAL_COMMUNITY)
Admission: EM | Admit: 2020-02-12 | Discharge: 2020-02-13 | Disposition: A | Discharge status: Home / Self Care | Payer: Medicare Other | Attending: Emergency Medicine | Admitting: Emergency Medicine

## 2020-02-12 ENCOUNTER — Other Ambulatory Visit: Payer: Self-pay

## 2020-02-12 DIAGNOSIS — W07XXXA Fall from chair, initial encounter: Secondary | ICD-10-CM | POA: Diagnosis not present

## 2020-02-12 DIAGNOSIS — S42291D Other displaced fracture of upper end of right humerus, subsequent encounter for fracture with routine healing: Secondary | ICD-10-CM | POA: Diagnosis not present

## 2020-02-12 DIAGNOSIS — Z20822 Contact with and (suspected) exposure to covid-19: Secondary | ICD-10-CM | POA: Diagnosis not present

## 2020-02-12 DIAGNOSIS — R296 Repeated falls: Secondary | ICD-10-CM | POA: Diagnosis not present

## 2020-02-12 DIAGNOSIS — Y92002 Bathroom of unspecified non-institutional (private) residence single-family (private) house as the place of occurrence of the external cause: Secondary | ICD-10-CM | POA: Insufficient documentation

## 2020-02-12 DIAGNOSIS — Y9301 Activity, walking, marching and hiking: Secondary | ICD-10-CM | POA: Diagnosis not present

## 2020-02-12 DIAGNOSIS — W19XXXA Unspecified fall, initial encounter: Secondary | ICD-10-CM

## 2020-02-12 DIAGNOSIS — Z7901 Long term (current) use of anticoagulants: Secondary | ICD-10-CM | POA: Insufficient documentation

## 2020-02-12 DIAGNOSIS — Z79899 Other long term (current) drug therapy: Secondary | ICD-10-CM | POA: Insufficient documentation

## 2020-02-12 DIAGNOSIS — I10 Essential (primary) hypertension: Secondary | ICD-10-CM | POA: Diagnosis not present

## 2020-02-12 DIAGNOSIS — S0010XA Contusion of unspecified eyelid and periocular area, initial encounter: Secondary | ICD-10-CM | POA: Diagnosis not present

## 2020-02-12 DIAGNOSIS — T1490XA Injury, unspecified, initial encounter: Secondary | ICD-10-CM | POA: Diagnosis not present

## 2020-02-12 LAB — COMPREHENSIVE METABOLIC PANEL
ALT: 25 U/L (ref 0–44)
AST: 25 U/L (ref 15–41)
Albumin: 4 g/dL (ref 3.5–5.0)
Alkaline Phosphatase: 70 U/L (ref 38–126)
Anion gap: 14 (ref 5–15)
BUN: 41 mg/dL — ABNORMAL HIGH (ref 8–23)
CO2: 24 mmol/L (ref 22–32)
Calcium: 9.4 mg/dL (ref 8.9–10.3)
Chloride: 99 mmol/L (ref 98–111)
Creatinine, Ser: 1.02 mg/dL — ABNORMAL HIGH (ref 0.44–1.00)
GFR, Estimated: 55 mL/min — ABNORMAL LOW (ref >60)
Glucose, Bld: 131 mg/dL — ABNORMAL HIGH (ref 70–99)
Potassium: 3.8 mmol/L (ref 3.5–5.1)
Sodium: 137 mmol/L (ref 135–145)
Total Bilirubin: 1.2 mg/dL (ref 0.3–1.2)
Total Protein: 7.2 g/dL (ref 6.5–8.1)

## 2020-02-12 LAB — CBC WITH DIFFERENTIAL/PLATELET
Abs Immature Granulocytes: 0.05 10*3/uL (ref 0.00–0.07)
Basophils Absolute: 0 10*3/uL (ref 0.0–0.1)
Basophils Relative: 0 %
Eosinophils Absolute: 0 10*3/uL (ref 0.0–0.5)
Eosinophils Relative: 0 %
HCT: 33.8 % — ABNORMAL LOW (ref 36.0–46.0)
Hemoglobin: 11.2 g/dL — ABNORMAL LOW (ref 12.0–15.0)
Immature Granulocytes: 1 %
Lymphocytes Relative: 4 %
Lymphs Abs: 0.4 10*3/uL — ABNORMAL LOW (ref 0.7–4.0)
MCH: 35.7 pg — ABNORMAL HIGH (ref 26.0–34.0)
MCHC: 33.1 g/dL (ref 30.0–36.0)
MCV: 107.6 fL — ABNORMAL HIGH (ref 80.0–100.0)
Monocytes Absolute: 1.1 10*3/uL — ABNORMAL HIGH (ref 0.1–1.0)
Monocytes Relative: 13 %
Neutro Abs: 6.7 10*3/uL (ref 1.7–7.7)
Neutrophils Relative %: 82 %
Platelets: 212 10*3/uL (ref 150–400)
RBC: 3.14 MIL/uL — ABNORMAL LOW (ref 3.87–5.11)
RDW: 12 % (ref 11.5–15.5)
WBC: 8.2 10*3/uL (ref 4.0–10.5)
nRBC: 0 % (ref 0.0–0.2)

## 2020-02-12 LAB — CK: Total CK: 170 U/L (ref 38–234)

## 2020-02-12 MED ORDER — ACETAMINOPHEN 325 MG PO TABS: 650.0000 mg | ORAL_TABLET | ORAL | Status: DC | PRN

## 2020-02-12 MED ORDER — CARVEDILOL 3.125 MG PO TABS: 6.2500 mg | ORAL_TABLET | Freq: Two times a day (BID) | ORAL | Status: DC

## 2020-02-12 MED ORDER — AMLODIPINE BESYLATE 5 MG PO TABS
5.0000 mg | ORAL_TABLET | Freq: Every day | ORAL | Status: DC
  Administered 2020-02-13: 5 mg via ORAL
  Filled 2020-02-12: qty 1

## 2020-02-12 MED ORDER — CARVEDILOL 3.125 MG PO TABS
6.2500 mg | ORAL_TABLET | Freq: Two times a day (BID) | ORAL | Status: DC
  Administered 2020-02-12 – 2020-02-13 (×3): 6.25 mg via ORAL
  Filled 2020-02-12 (×3): qty 2

## 2020-02-12 MED ORDER — AMLODIPINE BESYLATE 5 MG PO TABS: 5.0000 mg | ORAL_TABLET | Freq: Every day | ORAL | Status: DC

## 2020-02-12 MED ORDER — LISINOPRIL 20 MG PO TABS: 40.0000 mg | ORAL_TABLET | Freq: Every day | ORAL | Status: DC

## 2020-02-12 MED ORDER — LISINOPRIL 20 MG PO TABS
40.0000 mg | ORAL_TABLET | Freq: Every day | ORAL | Status: DC
  Administered 2020-02-13: 40 mg via ORAL
  Filled 2020-02-12: qty 2

## 2020-02-12 MED ORDER — ACETAMINOPHEN 500 MG PO TABS
1000.0000 mg | ORAL_TABLET | Freq: Three times a day (TID) | ORAL | Status: DC
  Administered 2020-02-13 (×2): 1000 mg via ORAL
  Filled 2020-02-12 (×2): qty 2

## 2020-02-12 MED ORDER — OXYCODONE HCL 5 MG PO TABS: 5.0000 mg | ORAL_TABLET | Freq: Four times a day (QID) | ORAL | Status: DC | PRN

## 2020-02-12 NOTE — ED Provider Notes (Signed)
[facility] DEPT Provider Note   CSN: [id number] Arrival date & time: 02/12/20  1526     History Chief Complaint  Patient presents with  . Fall    Gwendolyn [name] is a 85 y.o. female.  HPI     85yo female with history of atrilal fibrillation no longer on anticoagulation, hypertension, recent evaluation for fall 12/31 with right proximal humerus fracture and laceration presents with concern for fall and difficulty caring for herself at home in setting of recent injury.   Reports she lost her balance last night while walking to the bathroom and fell and was unable to get up due to her right arm fracture. Denies hitting head, anticoagulation, LOC, nausea, vomiting, headache, numbness, weakness, neck pain.  Reports she crawled around on the floor for a long time as she was unable to get up for hours.  She then was sitting and slipped out of her chair, didn't hurt anything. Denies injuries, denies lightheadedness, syncope or other medical concerns.     Past Medical History:  Diagnosis Date  . A-fib ([place])   . Atrial fibrillation ([place])   . Hypertension   . Loss of consciousness ([place])   . Syncope     There are no problems to display for this patient.   Past Surgical History:  Procedure Laterality Date  . BARTHOLIN GLAND CYST EXCISION       OB History   No obstetric history on file.     Family History  Problem Relation Age of Onset  . Parkinson's disease Mother   . Lung cancer Father   . Alzheimer's disease Sister   . Cirrhosis Brother   . Diabetes Sister   . Arthritis Sister     Social History   Tobacco Use  . Smoking status: Never Smoker  . Smokeless tobacco: Never Used  Vaping Use  . Vaping Use: Never used  Substance Use Topics  . Alcohol use: Yes    Comment: occasional  . Drug use: Never    Home Medications Prior to Admission medications   Medication Sig Start Date End Date Taking? Authorizing Provider  amLODipine  (NORVASC) 5 MG tablet Take 5 mg by mouth daily.   Yes [provider]  carvedilol (COREG) 6.25 MG tablet Take 6.25 mg by mouth 2 (two) times daily with a meal.   Yes [provider]  Cholecalciferol (VITAMIN D3) 50000 units CAPS Take 50,000 Units by mouth once a week. Mondays   Yes [provider]  lisinopril (PRINIVIL,ZESTRIL) 40 MG tablet Take 40 mg by mouth daily.   Yes [provider]  oxyCODONE-acetaminophen (PERCOCET) 5-325 MG tablet Take 1 tablet by mouth every 6 (six) hours as needed. Patient taking differently: Take 1 tablet by mouth every 6 (six) hours as needed for moderate pain. 02/09/20  Yes Drenda Freeze, MD  apixaban (ELIQUIS) 5 MG TABS tablet Take 1 tablet (5 mg total) by mouth 2 (two) times daily. Patient not taking: Reported on 02/12/2020 03/18/17 06/16/17  Consuelo Pandy, PA-C    Allergies    Patient has no known allergies.  Review of Systems   Review of Systems  Constitutional: Negative for fever.  HENT: Negative for sore throat.   Eyes: Negative for visual disturbance.  Respiratory: Negative for cough and shortness of breath.   Cardiovascular: Negative for chest pain.  Gastrointestinal: Negative for abdominal pain, nausea and vomiting.  Genitourinary: Negative for difficulty urinating.  Musculoskeletal: Positive for arthralgias. Negative for back pain  and neck pain.  Skin: Negative for rash.  Neurological: Negative for syncope and headaches.    Physical Exam Updated Vital Signs BP 128/71 (BP Location: Left Arm)   Pulse 67   Temp 98 F (36.7 C) (Oral)   Resp 15   Ht 5\' 1"  (1.549 m)   Wt 59 kg   SpO2 98%   BMI 24.58 kg/m   Physical Exam Vitals and nursing note reviewed.  Constitutional:      General: She is not in acute distress.    Appearance: She is well-developed and well-nourished. She is not diaphoretic.  HENT:     Head: Normocephalic.     Comments: Periorbital contusion Eyes:     Extraocular Movements:  EOM normal.     Conjunctiva/sclera: Conjunctivae normal.  Cardiovascular:     Rate and Rhythm: Normal rate and regular rhythm.     Pulses: Intact distal pulses.  Pulmonary:     Effort: Pulmonary effort is normal. No respiratory distress.     Breath sounds: Normal breath sounds.  Abdominal:     General: There is no distension.     Palpations: Abdomen is soft.     Tenderness: There is no abdominal tenderness. There is no guarding.  Musculoskeletal:        General: No edema.     Cervical back: Normal range of motion.     Comments: Right arm in sling, normal sensation, normal pulses  Skin:    General: Skin is warm and dry.     Findings: No erythema or rash.  Neurological:     Mental Status: She is alert and oriented to person, place, and time.     Cranial Nerves: No cranial nerve deficit.     Motor: No weakness.     ED Results / Procedures / Treatments   Labs (all labs ordered are listed, but only abnormal results are displayed) Labs Reviewed  CBC WITH DIFFERENTIAL/PLATELET - Abnormal; Notable for the following components:      Result Value   RBC 3.14 (*)    Hemoglobin 11.2 (*)    HCT 33.8 (*)    MCV 107.6 (*)    MCH 35.7 (*)    Lymphs Abs 0.4 (*)    Monocytes Absolute 1.1 (*)    All other components within normal limits  COMPREHENSIVE METABOLIC PANEL - Abnormal; Notable for the following components:   Glucose, Bld 131 (*)    BUN 41 (*)    Creatinine, Ser 1.02 (*)    GFR, Estimated 55 (*)    All other components within normal limits  SARS CORONAVIRUS 2 (TAT 6-24 HRS)  CK  URINALYSIS, ROUTINE W REFLEX MICROSCOPIC    EKG None  Radiology No results found.  Procedures Procedures (including critical care time)  Medications Ordered in ED Medications  oxyCODONE (Oxy IR/ROXICODONE) immediate release tablet 5 mg (has no administration in time range)  amLODipine (NORVASC) tablet 5 mg (has no administration in time range)  lisinopril (ZESTRIL) tablet 40 mg (has no  administration in time range)  carvedilol (COREG) tablet 6.25 mg (6.25 mg Oral Given 02/12/20 2215)  acetaminophen (TYLENOL) tablet 1,000 mg (has no administration in time range)    ED Course  I have reviewed the triage vital signs and the nursing notes.  Pertinent labs & imaging results that were available during my care of the patient were reviewed by me and considered in my medical decision making (see chart for details).    MDM Rules/Calculators/A&P  85yo female with history of atrilal fibrillation no longer on anticoagulation (per patient, unclear reason-recommend continued discussion with her outpatient doctor), hypertension, recent evaluation for fall 12/31 with right proximal humerus fracture and laceration presents with concern for fall and difficulty caring for herself at home in setting of recent injury.  Denies new injuries from falls last night.  Is not on anticoagulation, no headache, head trauma.  Doubt intracranial bleed.  No sign of rhabdomyolysis,. Mild AKI, mild anemia in comparison to prior in September. Denies Gi bleed symptoms.  No history to suggest infection.  No focal signs on exam to suggest CVA.   Discussed recommendation for discharge however she is having a difficult time caring for herself at home in the setting of her proximal humerus fracture and does not have support.  Consulted CSW and after discussion with patient will plan on referral to short-term rehab for her injury--FL2 faxed, PT consult ordered, COVID test pending. She is unvaccinated.    Final Clinical Impression(s) / ED Diagnoses Final diagnoses:  Fall, initial encounter  Frequent falls    Rx / DC Orders ED Discharge Orders    None       Alvira Monday, MD 02/12/20 2354

## 2020-02-12 NOTE — Progress Notes (Signed)
CSW updated by the EDP who states the pt was living with another person who later passed away and the person who passed away had a relative that is supportive of the pt in the pt's home when she checks on her, but that the pt now lives alone.  Per the EPD, pt has had several falls with the latest one being last night after which she was in a chair and slid out of it although she is A&OX4 and feels the pt would benefit from a "fall necklace" and states the pt is asking about placement but also has an arm fracture.  Per the EDP, pt would benefit from [facility] with social work services added in case pt was ultimately not successful at home and would need placement, as well as benefiting from a "fall necklace" with a life alert capability should she have another fall.  CSW will continue to follow for D/C needs.  Gwendolyn [name]. Gwendolyn [name]  MSW, LCSW, LCAS, CCS Transitions of Care Clinical Social Worker Care Coordination Department Ph: [phone]

## 2020-02-12 NOTE — TOC Initial Note (Signed)
Transition of Care [facility]) - Initial/Assessment Note    Patient Details  Name: Gwendolyn [name] MRN: [id number] Date of Birth: 11-11-1935  Transition of Care [facility]) CM/SW Contact:    Mercy Riding, LCSW Phone Number: 02/12/2020, 8:22 PM  Clinical Narrative:      CSW spoke to pt who confirmed the CSW's information and provided verbal permission for the CSW to call her contacts.  CSW called pt's sister Purnell Shoemaker at ph: [phone] and left a HIPPA compliant message requesting a call back.  CSW spoke with pt and confirmed pt's plan to be discharged to SNF  For short-term rehab at discharge.  CSW provided active listening and validated pt's concerns.   CSW was given permission to complete FL-2 and send referrals out to SNF facilities via the hub, per pt's request. Pt has been living independently prior to being admitted to [facility] ED.  CSW and RN CM spoke with EDP who will initiate a COVID test and PT consult.             Expected Discharge Plan: Skilled Nursing Facility Barriers to Discharge: Insurance Authorization   Patient Goals and CMS Choice        Expected Discharge Plan and Services Expected Discharge Plan: Skilled Nursing Facility       Living arrangements for the past 2 months: Single Family Home                                      Prior Living Arrangements/Services Living arrangements for the past 2 months: Single Family Home Lives with:: Self   Do you feel safe going back to the place where you live?: No      Need for Family Participation in Patient Care: No (Comment) Care giver support system in place?: No (comment)      Activities of Daily Living      Permission Sought/Granted Permission sought to share information with : Oceanographer granted to share information with : Yes, Verbal Permission Granted              Emotional Assessment Appearance:: Appears stated age   Affect (typically observed):  Calm,Accepting,Adaptable,Hopeful Orientation: : Oriented to Self,Oriented to Place,Oriented to  Time   Psych Involvement: No (comment)  Admission diagnosis:  unwitnessed fall There are no problems to display for this patient.  PCP:  Pearson Grippe, MD Pharmacy:   [facility] [phone] [place], [place] - [phone] AT [facility] OF [facility] & [address] [place] [address] Phone: [phone] Fax: [phone]  [facility][id number] [place], [place] - [address] AT [facility] OF [address] & [address] [place] [address] Phone: [phone] Fax: [phone]     Social Determinants of Health (SDOH) Interventions    Readmission Risk Interventions No flowsheet data found.   CSW will continue to follow for D/C needs.  Dorothe Pea. Icholas Irby  MSW, LCSW, LCAS, CCS Transitions of Care Clinical Social Worker Care Coordination Department Ph: [phone]

## 2020-02-12 NOTE — Progress Notes (Signed)
CSW received a call back from the pt's sister who stated she was supportive and was glad to hear the pt could possibly have the option of [facility] rehab before returning home.  CSW will continue to follow for D/C needs.  Dorothe Pea. Silvano Garofano  MSW, LCSW, LCAS, CCS Transitions of Care Clinical Social Worker Care Coordination Department Ph: [phone]   \

## 2020-02-12 NOTE — Progress Notes (Signed)
Pt now has a PASRR: [id number]  CSW will continue to follow for D/C needs.  Dorothe Pea. Tamaka Sawin  MSW, LCSW, LCAS, CCS Transitions of Care Clinical Social Worker Care Coordination Department Ph: [phone]

## 2020-02-12 NOTE — Progress Notes (Signed)
CSW spoke to pt who confirmed the CSW's information and provided verbal permission for the CSW to call her contacts.  CSW called pt's sister Purnell Shoemaker at ph: [phone] and left a HIPPA compliant message requesting a call back.  CSW spoke with pt and confirmed pt's plan to be discharged to SNF  For short-term rehab at discharge.  CSW provided active listening and validated pt's concerns.   CSW was given permission to complete FL-2 and send referrals out to SNF facilities via the hub, per pt's request.  Pt has been living independently prior to being admitted to [facility] ED.  CSW and RN CM spoke with EDP who will initiate a COVID test and PT consult.  CSW will continue to follow for D/C needs.  Dorothe Pea. Aitana Burry  MSW, LCSW, LCAS, CCS Transitions of Care Clinical Social Worker Care Coordination Department Ph: [phone]

## 2020-02-12 NOTE — NC FL2 (Signed)
  [place] MEDICAID FL2 LEVEL OF CARE SCREENING TOOL     IDENTIFICATION  [name] Birthdate: [birth year] Sex: female Admission Date (Current Location): 02/12/2020  [facility] and [place] Number:  Producer, television/film/video and Address:  [facility],  [address] [place] [place] [address]      Provider Number: [id number]  Attending Physician Name and Address:  Alvira Monday, MD  Relative Name and Phone Number:       Current Level of Care: Hospital Recommended Level of Care: Skilled Nursing Facility Prior Approval Number:    Date Approved/Denied:   PASRR Number: [id number]  Discharge Plan: Home    Current Diagnoses: There are no problems to display for this patient.   Orientation RESPIRATION BLADDER Height & Weight     Self,Time,Situation,Place  Normal Continent Weight: 130 lb 1.1 oz (59 kg) Height:  5\' 1"  (154.9 cm)  BEHAVIORAL SYMPTOMS/MOOD NEUROLOGICAL BOWEL NUTRITION STATUS      Continent Diet (Regular)  AMBULATORY STATUS COMMUNICATION OF NEEDS Skin   Limited Assist Verbally Other (Comment) (Pt has a bruise on above and to the left of her left eye as a result of a fall apparently.)                       Personal Care Assistance Level of Assistance  Bathing,Dressing Bathing Assistance: Limited assistance   Dressing Assistance: Limited assistance     Functional Limitations Info             SPECIAL CARE FACTORS FREQUENCY  PT (By licensed PT),OT (By licensed OT)     PT Frequency: 5 OT Frequency: 5            Contractures Contractures Info: Not present    Additional Factors Info  Code Status,Allergies Code Status Info: Not on file Allergies Info: No Known Allergies           Current Medications (02/12/2020):  This is the current hospital active medication list No current facility-administered medications for this encounter.   Current Outpatient Medications  Medication Sig Dispense Refill  . amLODipine  (NORVASC) 5 MG tablet Take 5 mg by mouth daily.    04/11/2020 apixaban (ELIQUIS) 5 MG TABS tablet Take 1 tablet (5 mg total) by mouth 2 (two) times daily. 180 tablet 0  . carvedilol (COREG) 6.25 MG tablet Take 6.25 mg by mouth 2 (two) times daily with a meal.    . Cholecalciferol (VITAMIN D3) 50000 units CAPS Take 50,000 Units by mouth once a week.    Marland Kitchen lisinopril (PRINIVIL,ZESTRIL) 40 MG tablet Take 40 mg by mouth daily.    Marland Kitchen oxyCODONE-acetaminophen (PERCOCET) 5-325 MG tablet Take 1 tablet by mouth every 6 (six) hours as needed. 8 tablet 0     Discharge Medications: Please see discharge summary for a list of discharge medications.  Relevant Imaging Results:  Relevant Lab Results:   Additional Information Marland Kitchen  [id number] Caiya Bettes, LCSW

## 2020-02-12 NOTE — ED Triage Notes (Signed)
BIB EMS from home, second fall today, was unwitnessed. Patient just said she lot her balance and slipped out of her chair, complaining of R arm pain, has a previous fx in this arm. Denies hitting head, no LOC.

## 2020-02-12 NOTE — ED Notes (Signed)
Warm blankets given to patient.

## 2020-02-13 DIAGNOSIS — Z9181 History of falling: Secondary | ICD-10-CM | POA: Diagnosis not present

## 2020-02-13 DIAGNOSIS — E559 Vitamin D deficiency, unspecified: Secondary | ICD-10-CM | POA: Diagnosis not present

## 2020-02-13 DIAGNOSIS — I10 Essential (primary) hypertension: Secondary | ICD-10-CM | POA: Diagnosis not present

## 2020-02-13 DIAGNOSIS — K5900 Constipation, unspecified: Secondary | ICD-10-CM | POA: Diagnosis not present

## 2020-02-13 DIAGNOSIS — Z79899 Other long term (current) drug therapy: Secondary | ICD-10-CM | POA: Diagnosis not present

## 2020-02-13 DIAGNOSIS — I6782 Cerebral ischemia: Secondary | ICD-10-CM | POA: Diagnosis not present

## 2020-02-13 DIAGNOSIS — I4891 Unspecified atrial fibrillation: Secondary | ICD-10-CM | POA: Diagnosis not present

## 2020-02-13 DIAGNOSIS — R633 Feeding difficulties, unspecified: Secondary | ICD-10-CM | POA: Diagnosis present

## 2020-02-13 DIAGNOSIS — R5381 Other malaise: Secondary | ICD-10-CM | POA: Diagnosis not present

## 2020-02-13 DIAGNOSIS — Z743 Need for continuous supervision: Secondary | ICD-10-CM | POA: Diagnosis not present

## 2020-02-13 DIAGNOSIS — R42 Dizziness and giddiness: Secondary | ICD-10-CM | POA: Diagnosis not present

## 2020-02-13 DIAGNOSIS — S0010XA Contusion of unspecified eyelid and periocular area, initial encounter: Secondary | ICD-10-CM | POA: Diagnosis not present

## 2020-02-13 DIAGNOSIS — M6281 Muscle weakness (generalized): Secondary | ICD-10-CM | POA: Diagnosis present

## 2020-02-13 DIAGNOSIS — N179 Acute kidney failure, unspecified: Secondary | ICD-10-CM | POA: Diagnosis not present

## 2020-02-13 DIAGNOSIS — S42294D Other nondisplaced fracture of upper end of right humerus, subsequent encounter for fracture with routine healing: Secondary | ICD-10-CM | POA: Diagnosis not present

## 2020-02-13 DIAGNOSIS — G319 Degenerative disease of nervous system, unspecified: Secondary | ICD-10-CM | POA: Diagnosis not present

## 2020-02-13 DIAGNOSIS — D649 Anemia, unspecified: Secondary | ICD-10-CM | POA: Diagnosis not present

## 2020-02-13 DIAGNOSIS — Z7901 Long term (current) use of anticoagulants: Secondary | ICD-10-CM | POA: Diagnosis not present

## 2020-02-13 DIAGNOSIS — I959 Hypotension, unspecified: Secondary | ICD-10-CM | POA: Diagnosis not present

## 2020-02-13 DIAGNOSIS — S0101XA Laceration without foreign body of scalp, initial encounter: Secondary | ICD-10-CM | POA: Diagnosis not present

## 2020-02-13 DIAGNOSIS — Z20822 Contact with and (suspected) exposure to covid-19: Secondary | ICD-10-CM | POA: Diagnosis not present

## 2020-02-13 DIAGNOSIS — R55 Syncope and collapse: Secondary | ICD-10-CM | POA: Diagnosis not present

## 2020-02-13 DIAGNOSIS — W1830XA Fall on same level, unspecified, initial encounter: Secondary | ICD-10-CM | POA: Diagnosis not present

## 2020-02-13 DIAGNOSIS — S42401D Unspecified fracture of lower end of right humerus, subsequent encounter for fracture with routine healing: Secondary | ICD-10-CM | POA: Diagnosis not present

## 2020-02-13 DIAGNOSIS — R279 Unspecified lack of coordination: Secondary | ICD-10-CM | POA: Diagnosis not present

## 2020-02-13 DIAGNOSIS — S42301D Unspecified fracture of shaft of humerus, right arm, subsequent encounter for fracture with routine healing: Secondary | ICD-10-CM | POA: Diagnosis not present

## 2020-02-13 DIAGNOSIS — Z4802 Encounter for removal of sutures: Secondary | ICD-10-CM | POA: Diagnosis not present

## 2020-02-13 DIAGNOSIS — R296 Repeated falls: Secondary | ICD-10-CM | POA: Diagnosis present

## 2020-02-13 DIAGNOSIS — W19XXXA Unspecified fall, initial encounter: Secondary | ICD-10-CM | POA: Diagnosis not present

## 2020-02-13 DIAGNOSIS — R2689 Other abnormalities of gait and mobility: Secondary | ICD-10-CM | POA: Diagnosis present

## 2020-02-13 DIAGNOSIS — S42294A Other nondisplaced fracture of upper end of right humerus, initial encounter for closed fracture: Secondary | ICD-10-CM | POA: Diagnosis not present

## 2020-02-13 DIAGNOSIS — S0181XD Laceration without foreign body of other part of head, subsequent encounter: Secondary | ICD-10-CM | POA: Diagnosis not present

## 2020-02-13 DIAGNOSIS — R531 Weakness: Secondary | ICD-10-CM | POA: Diagnosis not present

## 2020-02-13 LAB — URINALYSIS, ROUTINE W REFLEX MICROSCOPIC
Glucose, UA: NEGATIVE mg/dL
Hgb urine dipstick: NEGATIVE
Ketones, ur: 15 mg/dL — AB
Nitrite: NEGATIVE
Protein, ur: NEGATIVE mg/dL
Specific Gravity, Urine: 1.025 (ref 1.005–1.030)
pH: 5.5 (ref 5.0–8.0)

## 2020-02-13 LAB — URINALYSIS, MICROSCOPIC (REFLEX)

## 2020-02-13 LAB — SARS CORONAVIRUS 2 (TAT 6-24 HRS): SARS Coronavirus 2: NEGATIVE

## 2020-02-13 MED ORDER — OXYCODONE HCL 5 MG PO TABS: 5.0000 mg | ORAL_TABLET | Freq: Three times a day (TID) | ORAL | 0 refills | Status: AC | PRN

## 2020-02-13 NOTE — ED Notes (Signed)
Pt ambulated to the bathroom and used the bathroom.  While assisting pt w hand washing, pt passed out in this NT's arms.  Assistance called and RNs Trude Mcburney, and [place] and this NT got pt back to bed with the steady.  No LOC or fall during this episode.

## 2020-02-13 NOTE — TOC Initial Note (Signed)
Transition of Care [facility]) - Initial/Assessment Note    Patient Details  Name: Gwendolyn [name] MRN: [id number] Date of Birth: 02-Feb-1936  Transition of [facility]) CM/SW Contact:    Elliot Cousin, RN Phone Number: [phone] 02/13/2020, 2:47 PM  Clinical Narrative:                 TOC CM spoke to pt along with CSW at her bedside. Pt gave permission to speak to sister, Joyce Gross. States she receives her SS check each month. Has 4 prong cane at bedside.     Expected Discharge Plan: Skilled Nursing Facility Barriers to Discharge: No Barriers Identified   Patient Goals and CMS Choice Patient states their goals for this hospitalization and ongoing recovery are:: request to go to rehab CMS Medicare.gov Compare Post Acute Care list provided to:: Patient Choice offered to / list presented to : Patient  Expected Discharge Plan and Services Expected Discharge Plan: Skilled Nursing Facility In-house Referral: Clinical Social Work Discharge Planning Services: CM Consult   Living arrangements for the past 2 months: Single Family Home                                      Prior Living Arrangements/Services Living arrangements for the past 2 months: Single Family Home Lives with:: Self Patient language and need for interpreter reviewed:: Yes Do you feel safe going back to the place where you live?: Yes   lives alone  Need for Family Participation in Patient Care: Yes (Comment) Care giver support system in place?: Yes (comment) Current home services: DME (four prong cane) Criminal Activity/Legal Involvement Pertinent to Current Situation/Hospitalization: No - Comment as needed  Activities of Daily Living      Permission Sought/Granted Permission sought to share information with : Case Manager,Facility Contact Representative,PCP,Family Supports Permission granted to share information with : Yes, Verbal Permission Granted  Share Information with NAME: Servando Snare  Permission granted to share info w AGENCY: SNF rehab  Permission granted to share info w Relationship: sister  Permission granted to share info w Contact Information: [phone]  Emotional Assessment Appearance:: Appears stated age Attitude/Demeanor/Rapport: Gracious,Guarded Affect (typically observed): Accepting Orientation: : Oriented to Self,Oriented to Place,Oriented to  Time,Oriented to Situation   Psych Involvement: No (comment)  Admission diagnosis:  unwitnessed fall There are no problems to display for this patient.  PCP:  Pearson Grippe, MD Pharmacy:   [facility] [phone] [place], [place] - [phone] AT [facility] OF [facility] & [address] [place] [address] Phone: [phone] Fax: [phone]     Social Determinants of Health (SDOH) Interventions    Readmission Risk Interventions No flowsheet data found.

## 2020-02-13 NOTE — Discharge Instructions (Signed)
Suture removal in 6 days.  Wear sling until seen by Dr. Dion Saucier and advised otherwise.  Tylenol 650 every 6 hours for pain.  May take oxycodone if severe pain

## 2020-02-13 NOTE — Progress Notes (Addendum)
TOC CM spoke to ED RN, and report was called and PTAR. Faxed AVS, COVID results and Rx to [facility]. Pt will be going to room 128, number to call report # [phone]. TOC CM did contact pt's sister, Joyce Gross at pt's request to give her information about [address], address and phone number. Isidoro Donning RN CCM, [facility] ED TOC CM [phone]

## 2020-02-13 NOTE — Progress Notes (Signed)
TOC CM choices offered to patient for rehab. Waiting PT recommendations. Pt agreeable to [facility]. They are able to accept pt as long as she has a negative COVID test. Can accept pt today. Notified [facility] PT to see if pt can be seen soon. Isidoro Donning RN CCM, [facility] ED TOC CM [phone]

## 2020-02-13 NOTE — Evaluation (Signed)
Physical Therapy Evaluation Patient Details Name: Gwendolyn [name] No MRN: [id number] DOB: 1935/06/16 Today's Date: 02/13/2020   History of Present Illness  Patient is an 85 y.o. female with PMH significant for A-fib (no longer on anticoagulation),HTN, recent fall on 12/31 resulting in right proximal humerus fracture and laceration. Patient presents via EMS with concern for fall and difficulty caring for herself at home in setting of recent injury.    Clinical Impression  Gwendolyn [name] is 85 y.o. female admitted with above HPI and diagnosis. Patient is currently limited by functional impairments below (see PT problem list). Patient lives alone and has been limited with mobility due to repeated falls and recent Rt humeral fracture due to fall on 12/31. She currently requires min-mod assist for mobility and was limited to sit<>stand transfers due to dizziness with prolonged upright activity. Patient will benefit from continued skilled PT interventions to address impairments and progress independence with mobility, recommending SNF follow up. Acute PT will follow and progress as able.     Follow Up Recommendations SNF    Equipment Recommendations  None recommended by PT    Recommendations for Other Services OT consult     Precautions / Restrictions Precautions Precautions: Fall Required Braces or Orthoses: Sling Restrictions Weight Bearing Restrictions: Yes RUE Weight Bearing: Non weight bearing Other Position/Activity Restrictions: nothing in chart for WB restrictions from previous admission or current. pt in sling.      Mobility  Bed Mobility Overal bed mobility: Needs Assistance Bed Mobility: Supine to Sit;Sit to Supine     Supine to sit: Mod assist;HOB elevated Sit to supine: Min assist;HOB elevated   General bed mobility comments: cues to sequence bring LE's off EOB and to use Lt UE to raise trunk; mod assist to sit upright and min assist to control lowering trunk back to  supine.    Transfers Overall transfer level: Needs assistance Equipment used: None;Quad cane Transfers: Sit to/from Stand Sit to Stand: Min assist;Min guard         General transfer comment: Min assist to steady with rise from stretcher. pt leaning against stretcher initially to steady self. pt denied dizziness with activity. completed repeat sit<>stands for LE strength testing.  Ambulation/Gait             General Gait Details: deferred as pt has had 2 syncopal episodes this AM while mobilizing with NT/RN staff and c/o slight dizziness after upright activity for ~7 minutes at EOB.  Stairs            Wheelchair Mobility    Modified Rankin (Stroke Patients Only)       Balance Overall balance assessment: Needs assistance;History of Falls Sitting-balance support: Feet supported Sitting balance-Leahy Scale: Good   Postural control: Posterior lean Standing balance support: Single extremity supported;No upper extremity supported Standing balance-Leahy Scale: Poor Standing balance comment: pt required external support for small side step EOB.                             Pertinent Vitals/Pain Pain Assessment: No/denies pain    Home Living Family/patient expects to be discharged to:: Skilled nursing facility                 Additional Comments: Patient has uncertain living situation. 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 She would stay at a hotel when not staying with this family friend that she helped. Pt reports  in September the friend passed and she is now staying intermittently at the home but the family is planning to sell soon and she will not be able to return there. Pt is unsafe to live alone at this time in any setting and will benefit from rehab at SNF setting with 24/7 assist.    Prior Function Level of Independence: Needs assistance;Independent with assistive device(s)   Gait /  Transfers Assistance Needed: pt has been mobilizing with quad cane since fall on 12/31 and has had additional falls since discharge on 12/31.  ADL's / Homemaking Assistance Needed: pt has difficulty with bathing and dressing due to Rt humeral fracture.        Hand Dominance   Dominant Hand: Right    Extremity/Trunk Assessment   Upper Extremity Assessment Upper Extremity Assessment: Generalized weakness;RUE deficits/detail RUE: Unable to fully assess due to immobilization    Lower Extremity Assessment Lower Extremity Assessment: Generalized weakness (5x Sit<>Stand: 19.6 seconds with no UE use for power up, min guard for safety with rising from elevated height of stretcher.)    Cervical / Trunk Assessment Cervical / Trunk Assessment: Kyphotic  Communication   Communication: No difficulties  Cognition Arousal/Alertness: Awake/alert Behavior During Therapy: WFL for tasks assessed/performed Overall Cognitive Status: Within Functional Limits for tasks assessed                                 General Comments: Pt A&O  to self, place, situation, and date. she is pleasant and responds appropriately to questions. she requires some increased time to process.      General Comments      Exercises     Assessment/Plan    PT Assessment Patient needs continued PT services  PT Problem List Decreased strength;Decreased range of motion;Decreased activity tolerance;Decreased balance;Decreased mobility;Decreased knowledge of use of DME;Decreased knowledge of precautions       PT Treatment Interventions DME instruction;Gait training;Stair training;Functional mobility training;Therapeutic activities;Therapeutic exercise;Balance training;Patient/family education    PT Goals (Current goals can be found in the Care Plan section)  Acute Rehab PT Goals Patient Stated Goal: stop falling and follow up with MD about her shoulder fracture. PT Goal Formulation: With patient Time For  Goal Achievement: 02/27/20 Potential to Achieve Goals: Good    Frequency Min 2X/week   Barriers to discharge        Co-evaluation               AM-PAC PT "6 Clicks" Mobility  Outcome Measure Help needed turning from your back to your side while in a flat bed without using bedrails?: A Little Help needed moving from lying on your back to sitting on the side of a flat bed without using bedrails?: A Lot Help needed moving to and from a bed to a chair (including a wheelchair)?: A Little Help needed standing up from a chair using your arms (e.g., wheelchair or bedside chair)?: A Little Help needed to walk in hospital room?: A Lot Help needed climbing 3-5 steps with a railing? : A Lot 6 Click Score: 15    End of Session Equipment Utilized During Treatment: Gait belt Activity Tolerance: Patient tolerated treatment well Patient left: in bed;with call bell/phone within reach Nurse Communication: Mobility status PT Visit Diagnosis: Muscle weakness (generalized) (M62.81);Difficulty in walking, not elsewhere classified (R26.2)    Time: 1210-1229 PT Time Calculation (min) (ACUTE ONLY): 19 min   Charges:   PT  Evaluation $PT Eval Moderate Complexity: 1 Mod          Verner Mould, DPT Acute Rehabilitation Services Office [phone] Pager [phone]    Jacques Navy 02/13/2020, 3:36 PM

## 2020-02-13 NOTE — ED Notes (Signed)
Attempted to call Gwendolyn [name]. No answer

## 2020-02-13 NOTE — ED Provider Notes (Addendum)
Pt holding for SNF admission.  Pt denies any current complaints. Pt eating and drinking normally  Rn concerned pt has a uti.  I will start keflex and make sure urine is cultured. PE:  Pt awake and alert Vitals stable, lungs clear heart rrr,    2:45pm  Social work reports pt has been accepted at [facility].   Elson Areas, PA-C 02/13/20 [address], [place] 02/13/20 1452    Bethann Berkshire, MD 02/15/20 1030

## 2020-02-14 DIAGNOSIS — S42301D Unspecified fracture of shaft of humerus, right arm, subsequent encounter for fracture with routine healing: Secondary | ICD-10-CM | POA: Diagnosis not present

## 2020-02-14 DIAGNOSIS — I4891 Unspecified atrial fibrillation: Secondary | ICD-10-CM | POA: Diagnosis not present

## 2020-02-14 DIAGNOSIS — N179 Acute kidney failure, unspecified: Secondary | ICD-10-CM | POA: Diagnosis not present

## 2020-02-14 DIAGNOSIS — D649 Anemia, unspecified: Secondary | ICD-10-CM | POA: Diagnosis not present

## 2020-02-14 DIAGNOSIS — R5381 Other malaise: Secondary | ICD-10-CM | POA: Diagnosis not present

## 2020-02-14 DIAGNOSIS — E559 Vitamin D deficiency, unspecified: Secondary | ICD-10-CM | POA: Diagnosis not present

## 2020-02-14 DIAGNOSIS — I10 Essential (primary) hypertension: Secondary | ICD-10-CM | POA: Diagnosis not present

## 2020-02-14 LAB — URINE CULTURE: Culture: 10000 — AB

## 2020-02-19 ENCOUNTER — Emergency Department (HOSPITAL_COMMUNITY)
Admission: EM | Admit: 2020-02-19 | Discharge: 2020-02-19 | Disposition: A | Discharge status: Home / Self Care | Payer: Medicare Other | Attending: Emergency Medicine | Admitting: Emergency Medicine

## 2020-02-19 ENCOUNTER — Emergency Department (HOSPITAL_COMMUNITY): Payer: Medicare Other

## 2020-02-19 ENCOUNTER — Encounter (HOSPITAL_COMMUNITY): Payer: Self-pay

## 2020-02-19 ENCOUNTER — Other Ambulatory Visit: Payer: Self-pay

## 2020-02-19 DIAGNOSIS — Z79899 Other long term (current) drug therapy: Secondary | ICD-10-CM | POA: Diagnosis not present

## 2020-02-19 DIAGNOSIS — W1830XA Fall on same level, unspecified, initial encounter: Secondary | ICD-10-CM | POA: Diagnosis not present

## 2020-02-19 DIAGNOSIS — R42 Dizziness and giddiness: Secondary | ICD-10-CM | POA: Diagnosis not present

## 2020-02-19 DIAGNOSIS — Z7901 Long term (current) use of anticoagulants: Secondary | ICD-10-CM | POA: Insufficient documentation

## 2020-02-19 DIAGNOSIS — I6782 Cerebral ischemia: Secondary | ICD-10-CM | POA: Diagnosis not present

## 2020-02-19 DIAGNOSIS — S0181XD Laceration without foreign body of other part of head, subsequent encounter: Secondary | ICD-10-CM | POA: Diagnosis not present

## 2020-02-19 DIAGNOSIS — I10 Essential (primary) hypertension: Secondary | ICD-10-CM | POA: Diagnosis not present

## 2020-02-19 DIAGNOSIS — R55 Syncope and collapse: Secondary | ICD-10-CM

## 2020-02-19 DIAGNOSIS — R531 Weakness: Secondary | ICD-10-CM | POA: Diagnosis not present

## 2020-02-19 DIAGNOSIS — I959 Hypotension, unspecified: Secondary | ICD-10-CM | POA: Diagnosis not present

## 2020-02-19 DIAGNOSIS — S42294A Other nondisplaced fracture of upper end of right humerus, initial encounter for closed fracture: Secondary | ICD-10-CM | POA: Diagnosis not present

## 2020-02-19 DIAGNOSIS — Z4802 Encounter for removal of sutures: Secondary | ICD-10-CM | POA: Diagnosis not present

## 2020-02-19 DIAGNOSIS — S0101XA Laceration without foreign body of scalp, initial encounter: Secondary | ICD-10-CM | POA: Diagnosis not present

## 2020-02-19 DIAGNOSIS — R279 Unspecified lack of coordination: Secondary | ICD-10-CM | POA: Diagnosis not present

## 2020-02-19 DIAGNOSIS — W19XXXA Unspecified fall, initial encounter: Secondary | ICD-10-CM | POA: Diagnosis not present

## 2020-02-19 DIAGNOSIS — Z743 Need for continuous supervision: Secondary | ICD-10-CM | POA: Diagnosis not present

## 2020-02-19 DIAGNOSIS — G319 Degenerative disease of nervous system, unspecified: Secondary | ICD-10-CM | POA: Diagnosis not present

## 2020-02-19 LAB — CBC WITH DIFFERENTIAL/PLATELET
Abs Immature Granulocytes: 0.04 10*3/uL (ref 0.00–0.07)
Basophils Absolute: 0.1 10*3/uL (ref 0.0–0.1)
Basophils Relative: 1 %
Eosinophils Absolute: 0.1 10*3/uL (ref 0.0–0.5)
Eosinophils Relative: 1 %
HCT: 34.7 % — ABNORMAL LOW (ref 36.0–46.0)
Hemoglobin: 11.6 g/dL — ABNORMAL LOW (ref 12.0–15.0)
Immature Granulocytes: 0 %
Lymphocytes Relative: 6 %
Lymphs Abs: 0.5 10*3/uL — ABNORMAL LOW (ref 0.7–4.0)
MCH: 36 pg — ABNORMAL HIGH (ref 26.0–34.0)
MCHC: 33.4 g/dL (ref 30.0–36.0)
MCV: 107.8 fL — ABNORMAL HIGH (ref 80.0–100.0)
Monocytes Absolute: 0.8 10*3/uL (ref 0.1–1.0)
Monocytes Relative: 9 %
Neutro Abs: 7.9 10*3/uL — ABNORMAL HIGH (ref 1.7–7.7)
Neutrophils Relative %: 83 %
Platelets: 275 10*3/uL (ref 150–400)
RBC: 3.22 MIL/uL — ABNORMAL LOW (ref 3.87–5.11)
RDW: 11.5 % (ref 11.5–15.5)
WBC: 9.4 10*3/uL (ref 4.0–10.5)
nRBC: 0 % (ref 0.0–0.2)

## 2020-02-19 LAB — BASIC METABOLIC PANEL
Anion gap: 9 (ref 5–15)
BUN: 23 mg/dL (ref 8–23)
CO2: 26 mmol/L (ref 22–32)
Calcium: 9.3 mg/dL (ref 8.9–10.3)
Chloride: 103 mmol/L (ref 98–111)
Creatinine, Ser: 1 mg/dL (ref 0.44–1.00)
GFR, Estimated: 56 mL/min — ABNORMAL LOW (ref >60)
Glucose, Bld: 117 mg/dL — ABNORMAL HIGH (ref 70–99)
Potassium: 4.3 mmol/L (ref 3.5–5.1)
Sodium: 138 mmol/L (ref 135–145)

## 2020-02-19 LAB — TROPONIN I (HIGH SENSITIVITY): Troponin I (High Sensitivity): 10 ng/L (ref <18)

## 2020-02-19 MED ORDER — SODIUM CHLORIDE 0.9 % IV BOLUS
1000.0000 mL | Freq: Once | INTRAVENOUS | Status: AC
  Administered 2020-02-19: 1000 mL via INTRAVENOUS

## 2020-02-19 NOTE — ED Provider Notes (Signed)
[facility]-EMERGENCY DEPT Provider Note   CSN: [id number] Arrival date & time: 02/19/20  1140     History Chief Complaint  Patient presents with  . Loss of Consciousness    Gwendolyn [name] is a 85 y.o. female hx of afib on eliquis, HTN, who presented with syncope.  Patient states she broke her humerus about 10 days ago.  Patient had a scalp laceration at that time.  Patient then had subsequent falls.  Patient was at orthopedic doctor's office and stood up for an x-ray.  She states that apparently she passed out afterwards.  She denies any head injury.  She was talking to the orthopedic doctor and was noted to be altered EMS was called. Denies chest pain or shortness of breath. No seizure-like activity witnessed.  The history is provided by the patient.       Past Medical History:  Diagnosis Date  . A-fib (HCC)   . Atrial fibrillation (HCC)   . Hypertension   . Loss of consciousness (HCC)   . Syncope     There are no problems to display for this patient.   Past Surgical History:  Procedure Laterality Date  . BARTHOLIN GLAND CYST EXCISION       OB History   No obstetric history on file.     Family History  Problem Relation Age of Onset  . Parkinson's disease Mother   . Lung cancer Father   . Alzheimer's disease Sister   . Cirrhosis Brother   . Diabetes Sister   . Arthritis Sister     Social History   Tobacco Use  . Smoking status: Never Smoker  . Smokeless tobacco: Never Used  Vaping Use  . Vaping Use: Never used  Substance Use Topics  . Alcohol use: Yes    Comment: occasional  . Drug use: Never    Home Medications Prior to Admission medications   Medication Sig Start Date End Date Taking? Authorizing Provider  amLODipine (NORVASC) 5 MG tablet Take 5 mg by mouth daily.    [provider]  apixaban (ELIQUIS) 5 MG TABS tablet Take 1 tablet (5 mg total) by mouth 2 (two) times daily. Patient not taking: Reported on  02/12/2020 03/18/17 06/16/17  Robbie Lis M, PA-C  carvedilol (COREG) 6.25 MG tablet Take 6.25 mg by mouth 2 (two) times daily with a meal.    [provider]  Cholecalciferol (VITAMIN D3) 50000 units CAPS Take 50,000 Units by mouth once a week. Mondays    [provider]  lisinopril (PRINIVIL,ZESTRIL) 40 MG tablet Take 40 mg by mouth daily.    [provider]  oxyCODONE (ROXICODONE) 5 MG immediate release tablet Take 1 tablet (5 mg total) by mouth every 8 (eight) hours as needed for severe pain (severe pain not relieved by tylenol). 02/13/20 02/12/21  Elson Areas, PA-C  oxyCODONE-acetaminophen (PERCOCET) 5-325 MG tablet Take 1 tablet by mouth every 6 (six) hours as needed. Patient taking differently: Take 1 tablet by mouth every 6 (six) hours as needed for moderate pain. 02/09/20   Charlynne Pander, MD    Allergies    Patient has no known allergies.  Review of Systems   Review of Systems  Cardiovascular: Positive for syncope.  Neurological: Positive for syncope.  All other systems reviewed and are negative.   Physical Exam Updated Vital Signs BP 133/73   Pulse 78   Temp 98.1 F (36.7 C) (Oral)   Resp 17   SpO2 98%  Physical Exam Vitals and nursing note reviewed.  HENT:     Head:     Comments: Scab on the right forehead and stitches in place. No active bleeding and no new bruising    Mouth/Throat:     Mouth: Mucous membranes are moist.  Eyes:     Extraocular Movements: Extraocular movements intact.     Pupils: Pupils are equal, round, and reactive to light.  Cardiovascular:     Rate and Rhythm: Normal rate and regular rhythm.     Pulses: Normal pulses.     Heart sounds: Normal heart sounds.  Pulmonary:     Effort: Pulmonary effort is normal.     Breath sounds: Normal breath sounds.  Abdominal:     General: Abdomen is flat.     Palpations: Abdomen is soft.  Musculoskeletal:     Cervical back: Normal range of motion.     Comments:  Ecchymosis along the right arm consistent with old humeral fracture. Normal pulses in the right arm.  Skin:    General: Skin is warm.  Neurological:     General: No focal deficit present.     Mental Status: She is alert and oriented to person, place, and time.  Psychiatric:        Mood and Affect: Mood normal.        Behavior: Behavior normal.     ED Results / Procedures / Treatments   Labs (all labs ordered are listed, but only abnormal results are displayed) Labs Reviewed  CBC WITH DIFFERENTIAL/PLATELET - Abnormal; Notable for the following components:      Result Value   RBC 3.22 (*)    Hemoglobin 11.6 (*)    HCT 34.7 (*)    MCV 107.8 (*)    MCH 36.0 (*)    Neutro Abs 7.9 (*)    Lymphs Abs 0.5 (*)    All other components within normal limits  BASIC METABOLIC PANEL - Abnormal; Notable for the following components:   Glucose, Bld 117 (*)    GFR, Estimated 56 (*)    All other components within normal limits  TROPONIN I (HIGH SENSITIVITY)  TROPONIN I (HIGH SENSITIVITY)    EKG None  Radiology CT Head Wo Contrast  Result Date: 02/19/2020 CLINICAL DATA:  Syncope, fall while in rehab earlier today EXAM: CT HEAD WITHOUT CONTRAST TECHNIQUE: Contiguous axial images were obtained from the base of the skull through the vertex without intravenous contrast. COMPARISON:  Recent prior head CT 02/09/2020 FINDINGS: Brain: No evidence of acute infarction, hemorrhage, hydrocephalus, extra-axial collection or mass lesion/mass effect. Mild cerebral cortical atrophy. Confluent periventricular and scattered subcortical and deep white matter hypoattenuation most consistent with chronic microvascular ischemic white matter disease. Remote right basal ganglia ischemic insult. No significant interval change compared to recent prior imaging. Vascular: No hyperdense vessel or unexpected calcification. Atherosclerotic calcifications present in the cavernous carotid arteries. Skull: Normal. Negative for  fracture or focal lesion. Sinuses/Orbits: No acute finding. Other: None. IMPRESSION: 1. No acute intracranial abnormality. 2. Stable appearance of the brain compared to recent prior imaging from 02/09/2020. Electronically Signed   By: Malachy Moan M.D.   On: 02/19/2020 15:05    Procedures Procedures (including critical care time)  SUTURE REMOVAL Performed by: Richardean Canal  Consent: Verbal consent obtained. Patient identity confirmed: provided demographic data Time out: Immediately prior to procedure a "time out" was called to verify the correct patient, procedure, equipment, support staff and site/side marked as required.  Location details:  R forehead  Wound Appearance: clean  Sutures/Staples Removed: 3   Facility: sutures placed in this facility Patient tolerance: Patient tolerated the procedure well with no immediate complications.     Medications Ordered in ED Medications  sodium chloride 0.9 % bolus 1,000 mL (0 mLs Intravenous Stopped 02/19/20 1444)    ED Course  I have reviewed the triage vital signs and the nursing notes.  Pertinent labs & imaging results that were available during my care of the patient were reviewed by me and considered in my medical decision making (see chart for details).    MDM Rules/Calculators/A&P                          Gwendolyn [name] is a 85 y.o. female who presented with near syncope. Likely vasovagal or orthostatic hypotension since it happened when she stood up for an x-ray. Given the altered mental status and recent head injury and unclear if she hit her head today, will get repeat CT head and also basic lab work. Patient is now back to baseline. I also remove the sutures.  3:34 PM Labs and CT head unremarkable.  Patient is not hypotensive.  Stable for discharge home.  Patient has a right humerus fractures that is being followed with Ortho and I told her to make another appointment with Ortho.   Final Clinical Impression(s) / ED  Diagnoses Final diagnoses:  None    Rx / DC Orders ED Discharge Orders    None       Charlynne Pander, MD 02/19/20 1535

## 2020-02-19 NOTE — ED Notes (Signed)
ED Provider at bedside. 

## 2020-02-19 NOTE — Discharge Instructions (Signed)
Your heart function and your blood work and CT head are normal today  You likely stood up too fast and that is what is causing you to pass out  Please stay hydrated.  Follow-up with your primary care doctor as well as your orthopedic doctor.  Return to ER if you have passing out, another fall, chest pain, trouble breathing

## 2020-02-19 NOTE — ED Notes (Signed)
Report called to [facility] and PTAR called for transport

## 2020-02-19 NOTE — ED Triage Notes (Signed)
Pt BIB EMS from [facility], staying for rehab currently. Pt had a syncopal episode in front of staff. Pt was A&Ox4 for EMS with no complaints. EMS walked her 2 feet to stretcher, pt then c/o dizziness and feeling lightheaded. Pt has right arm restriction from previous fx. Initial BP was 86/44. Pt has hx of syncopal episodes.   133/63 most recent BP with EMS 65 HR 16 Resp CBG 141 98% RA

## 2020-02-19 NOTE — ED Notes (Signed)
Pt transported to CT at this time.

## 2020-02-20 DIAGNOSIS — E559 Vitamin D deficiency, unspecified: Secondary | ICD-10-CM | POA: Diagnosis not present

## 2020-02-20 DIAGNOSIS — R5381 Other malaise: Secondary | ICD-10-CM | POA: Diagnosis not present

## 2020-02-20 DIAGNOSIS — R55 Syncope and collapse: Secondary | ICD-10-CM | POA: Diagnosis not present

## 2020-02-20 DIAGNOSIS — I10 Essential (primary) hypertension: Secondary | ICD-10-CM | POA: Diagnosis not present

## 2020-02-20 DIAGNOSIS — D649 Anemia, unspecified: Secondary | ICD-10-CM | POA: Diagnosis not present

## 2020-02-20 DIAGNOSIS — N179 Acute kidney failure, unspecified: Secondary | ICD-10-CM | POA: Diagnosis not present

## 2020-03-01 DIAGNOSIS — D649 Anemia, unspecified: Secondary | ICD-10-CM | POA: Diagnosis not present

## 2020-03-01 DIAGNOSIS — N179 Acute kidney failure, unspecified: Secondary | ICD-10-CM | POA: Diagnosis not present

## 2020-03-01 DIAGNOSIS — E559 Vitamin D deficiency, unspecified: Secondary | ICD-10-CM | POA: Diagnosis not present

## 2020-03-01 DIAGNOSIS — I10 Essential (primary) hypertension: Secondary | ICD-10-CM | POA: Diagnosis not present

## 2020-03-01 DIAGNOSIS — R5381 Other malaise: Secondary | ICD-10-CM | POA: Diagnosis not present

## 2020-03-01 DIAGNOSIS — I4891 Unspecified atrial fibrillation: Secondary | ICD-10-CM | POA: Diagnosis not present

## 2020-03-01 DIAGNOSIS — K5900 Constipation, unspecified: Secondary | ICD-10-CM | POA: Diagnosis not present

## 2020-03-06 DIAGNOSIS — S42294D Other nondisplaced fracture of upper end of right humerus, subsequent encounter for fracture with routine healing: Secondary | ICD-10-CM | POA: Diagnosis not present

## 2020-03-08 DIAGNOSIS — N179 Acute kidney failure, unspecified: Secondary | ICD-10-CM | POA: Diagnosis not present

## 2020-03-08 DIAGNOSIS — D649 Anemia, unspecified: Secondary | ICD-10-CM | POA: Diagnosis not present

## 2020-03-08 DIAGNOSIS — I10 Essential (primary) hypertension: Secondary | ICD-10-CM | POA: Diagnosis not present

## 2020-03-08 DIAGNOSIS — K5900 Constipation, unspecified: Secondary | ICD-10-CM | POA: Diagnosis not present

## 2020-03-08 DIAGNOSIS — R5381 Other malaise: Secondary | ICD-10-CM | POA: Diagnosis not present

## 2020-03-08 DIAGNOSIS — E559 Vitamin D deficiency, unspecified: Secondary | ICD-10-CM | POA: Diagnosis not present

## 2020-03-08 DIAGNOSIS — I4891 Unspecified atrial fibrillation: Secondary | ICD-10-CM | POA: Diagnosis not present

## 2020-03-20 DIAGNOSIS — S42294D Other nondisplaced fracture of upper end of right humerus, subsequent encounter for fracture with routine healing: Secondary | ICD-10-CM | POA: Diagnosis not present

## 2020-03-21 DIAGNOSIS — N179 Acute kidney failure, unspecified: Secondary | ICD-10-CM | POA: Diagnosis not present

## 2020-03-21 DIAGNOSIS — E559 Vitamin D deficiency, unspecified: Secondary | ICD-10-CM | POA: Diagnosis not present

## 2020-03-21 DIAGNOSIS — I10 Essential (primary) hypertension: Secondary | ICD-10-CM | POA: Diagnosis not present

## 2020-03-21 DIAGNOSIS — K5900 Constipation, unspecified: Secondary | ICD-10-CM | POA: Diagnosis not present

## 2020-03-21 DIAGNOSIS — D649 Anemia, unspecified: Secondary | ICD-10-CM | POA: Diagnosis not present

## 2020-03-21 DIAGNOSIS — R5381 Other malaise: Secondary | ICD-10-CM | POA: Diagnosis not present

## 2020-03-21 DIAGNOSIS — I4891 Unspecified atrial fibrillation: Secondary | ICD-10-CM | POA: Diagnosis not present

## 2020-04-24 DIAGNOSIS — S42294D Other nondisplaced fracture of upper end of right humerus, subsequent encounter for fracture with routine healing: Secondary | ICD-10-CM | POA: Diagnosis not present

## 2020-05-22 DIAGNOSIS — S42294D Other nondisplaced fracture of upper end of right humerus, subsequent encounter for fracture with routine healing: Secondary | ICD-10-CM | POA: Diagnosis not present

## 2020-06-06 DIAGNOSIS — I10 Essential (primary) hypertension: Secondary | ICD-10-CM | POA: Diagnosis not present

## 2020-06-06 DIAGNOSIS — E559 Vitamin D deficiency, unspecified: Secondary | ICD-10-CM | POA: Diagnosis not present

## 2020-06-06 DIAGNOSIS — Z0000 Encounter for general adult medical examination without abnormal findings: Secondary | ICD-10-CM | POA: Diagnosis not present

## 2020-06-06 DIAGNOSIS — Z131 Encounter for screening for diabetes mellitus: Secondary | ICD-10-CM | POA: Diagnosis not present

## 2020-06-06 DIAGNOSIS — R739 Hyperglycemia, unspecified: Secondary | ICD-10-CM | POA: Diagnosis not present

## 2020-06-06 DIAGNOSIS — R946 Abnormal results of thyroid function studies: Secondary | ICD-10-CM | POA: Diagnosis not present

## 2020-06-06 DIAGNOSIS — N390 Urinary tract infection, site not specified: Secondary | ICD-10-CM | POA: Diagnosis not present

## 2020-06-13 DIAGNOSIS — Z0000 Encounter for general adult medical examination without abnormal findings: Secondary | ICD-10-CM | POA: Diagnosis not present

## 2020-06-13 DIAGNOSIS — E559 Vitamin D deficiency, unspecified: Secondary | ICD-10-CM | POA: Diagnosis not present

## 2020-06-13 DIAGNOSIS — I10 Essential (primary) hypertension: Secondary | ICD-10-CM | POA: Diagnosis not present

## 2020-07-16 DIAGNOSIS — H18413 Arcus senilis, bilateral: Secondary | ICD-10-CM | POA: Diagnosis not present

## 2020-07-16 DIAGNOSIS — H25043 Posterior subcapsular polar age-related cataract, bilateral: Secondary | ICD-10-CM | POA: Diagnosis not present

## 2020-07-16 DIAGNOSIS — H2511 Age-related nuclear cataract, right eye: Secondary | ICD-10-CM | POA: Diagnosis not present

## 2020-07-16 DIAGNOSIS — H25013 Cortical age-related cataract, bilateral: Secondary | ICD-10-CM | POA: Diagnosis not present

## 2020-07-16 DIAGNOSIS — H2513 Age-related nuclear cataract, bilateral: Secondary | ICD-10-CM | POA: Diagnosis not present

## 2020-08-05 DIAGNOSIS — H2511 Age-related nuclear cataract, right eye: Secondary | ICD-10-CM | POA: Diagnosis not present

## 2020-08-05 DIAGNOSIS — H2589 Other age-related cataract: Secondary | ICD-10-CM | POA: Diagnosis not present

## 2020-08-05 DIAGNOSIS — H25811 Combined forms of age-related cataract, right eye: Secondary | ICD-10-CM | POA: Diagnosis not present

## 2020-08-06 DIAGNOSIS — H2512 Age-related nuclear cataract, left eye: Secondary | ICD-10-CM | POA: Diagnosis not present

## 2020-09-16 DIAGNOSIS — H2512 Age-related nuclear cataract, left eye: Secondary | ICD-10-CM | POA: Diagnosis not present

## 2020-09-16 DIAGNOSIS — H25812 Combined forms of age-related cataract, left eye: Secondary | ICD-10-CM | POA: Diagnosis not present

## 2020-11-21 DIAGNOSIS — Z23 Encounter for immunization: Secondary | ICD-10-CM | POA: Diagnosis not present

## 2020-12-17 DIAGNOSIS — I10 Essential (primary) hypertension: Secondary | ICD-10-CM | POA: Diagnosis not present

## 2020-12-17 DIAGNOSIS — E559 Vitamin D deficiency, unspecified: Secondary | ICD-10-CM | POA: Diagnosis not present

## 2020-12-17 DIAGNOSIS — R000 Tachycardia, unspecified: Secondary | ICD-10-CM | POA: Diagnosis not present

## 2021-05-01 ENCOUNTER — Emergency Department (HOSPITAL_COMMUNITY): Payer: Medicare Other

## 2021-05-01 ENCOUNTER — Emergency Department (HOSPITAL_COMMUNITY)
Admission: EM | Admit: 2021-05-01 | Discharge: 2021-05-01 | Disposition: A | Discharge status: Home / Self Care | Payer: Medicare Other | Attending: Emergency Medicine | Admitting: Emergency Medicine

## 2021-05-01 ENCOUNTER — Other Ambulatory Visit: Payer: Self-pay

## 2021-05-01 DIAGNOSIS — S0990XA Unspecified injury of head, initial encounter: Secondary | ICD-10-CM

## 2021-05-01 DIAGNOSIS — R4020 Unspecified coma: Secondary | ICD-10-CM | POA: Diagnosis not present

## 2021-05-01 DIAGNOSIS — U071 COVID-19: Secondary | ICD-10-CM | POA: Insufficient documentation

## 2021-05-01 DIAGNOSIS — Y92510 Bank as the place of occurrence of the external cause: Secondary | ICD-10-CM | POA: Insufficient documentation

## 2021-05-01 DIAGNOSIS — S0003XA Contusion of scalp, initial encounter: Secondary | ICD-10-CM | POA: Diagnosis not present

## 2021-05-01 DIAGNOSIS — I10 Essential (primary) hypertension: Secondary | ICD-10-CM | POA: Insufficient documentation

## 2021-05-01 DIAGNOSIS — I959 Hypotension, unspecified: Secondary | ICD-10-CM | POA: Diagnosis not present

## 2021-05-01 DIAGNOSIS — Z7901 Long term (current) use of anticoagulants: Secondary | ICD-10-CM | POA: Insufficient documentation

## 2021-05-01 DIAGNOSIS — R55 Syncope and collapse: Secondary | ICD-10-CM | POA: Diagnosis not present

## 2021-05-01 DIAGNOSIS — X58XXXA Exposure to other specified factors, initial encounter: Secondary | ICD-10-CM | POA: Insufficient documentation

## 2021-05-01 DIAGNOSIS — Z79899 Other long term (current) drug therapy: Secondary | ICD-10-CM | POA: Insufficient documentation

## 2021-05-01 DIAGNOSIS — R0902 Hypoxemia: Secondary | ICD-10-CM | POA: Diagnosis not present

## 2021-05-01 DIAGNOSIS — R000 Tachycardia, unspecified: Secondary | ICD-10-CM | POA: Diagnosis not present

## 2021-05-01 LAB — CBC WITH DIFFERENTIAL/PLATELET
Abs Immature Granulocytes: 0.03 10*3/uL (ref 0.00–0.07)
Basophils Absolute: 0 10*3/uL (ref 0.0–0.1)
Basophils Relative: 0 %
Eosinophils Absolute: 0 10*3/uL (ref 0.0–0.5)
Eosinophils Relative: 0 %
HCT: 33.7 % — ABNORMAL LOW (ref 36.0–46.0)
Hemoglobin: 11.1 g/dL — ABNORMAL LOW (ref 12.0–15.0)
Immature Granulocytes: 0 %
Lymphocytes Relative: 6 %
Lymphs Abs: 0.5 10*3/uL — ABNORMAL LOW (ref 0.7–4.0)
MCH: 33.1 pg (ref 26.0–34.0)
MCHC: 32.9 g/dL (ref 30.0–36.0)
MCV: 100.6 fL — ABNORMAL HIGH (ref 80.0–100.0)
Monocytes Absolute: 1.1 10*3/uL — ABNORMAL HIGH (ref 0.1–1.0)
Monocytes Relative: 15 %
Neutro Abs: 5.6 10*3/uL (ref 1.7–7.7)
Neutrophils Relative %: 79 %
Platelets: 153 10*3/uL (ref 150–400)
RBC: 3.35 MIL/uL — ABNORMAL LOW (ref 3.87–5.11)
RDW: 12.7 % (ref 11.5–15.5)
WBC: 7.2 10*3/uL (ref 4.0–10.5)
nRBC: 0 % (ref 0.0–0.2)

## 2021-05-01 LAB — RESP PANEL BY RT-PCR (FLU A&B, COVID) ARPGX2
Influenza A by PCR: NEGATIVE
Influenza B by PCR: NEGATIVE
SARS Coronavirus 2 by RT PCR: POSITIVE — AB

## 2021-05-01 LAB — COMPREHENSIVE METABOLIC PANEL
ALT: 12 U/L (ref 0–44)
AST: 18 U/L (ref 15–41)
Albumin: 3.1 g/dL — ABNORMAL LOW (ref 3.5–5.0)
Alkaline Phosphatase: 59 U/L (ref 38–126)
Anion gap: 10 (ref 5–15)
BUN: 22 mg/dL (ref 8–23)
CO2: 22 mmol/L (ref 22–32)
Calcium: 8.3 mg/dL — ABNORMAL LOW (ref 8.9–10.3)
Chloride: 99 mmol/L (ref 98–111)
Creatinine, Ser: 1.27 mg/dL — ABNORMAL HIGH (ref 0.44–1.00)
GFR, Estimated: 42 mL/min — ABNORMAL LOW (ref >60)
Glucose, Bld: 118 mg/dL — ABNORMAL HIGH (ref 70–99)
Potassium: 3.9 mmol/L (ref 3.5–5.1)
Sodium: 131 mmol/L — ABNORMAL LOW (ref 135–145)
Total Bilirubin: 0.5 mg/dL (ref 0.3–1.2)
Total Protein: 6.3 g/dL — ABNORMAL LOW (ref 6.5–8.1)

## 2021-05-01 LAB — CBG MONITORING, ED: Glucose-Capillary: 109 mg/dL — ABNORMAL HIGH (ref 70–99)

## 2021-05-01 MED ORDER — SODIUM CHLORIDE 0.9 % IV SOLN: INTRAVENOUS | Status: DC

## 2021-05-01 NOTE — Discharge Instructions (Addendum)
CT head without any acute injury to the brain or skull.  Make an appointment follow-up with your doctor.  Return for any new or worse symptoms or for any further passing out. ? ?Renal function was a little bit off today just have your doctor recheck that in a week or 2 to make sure that it is getting more normalized for you. ?

## 2021-05-01 NOTE — ED Triage Notes (Signed)
Pt arrived by EMS, had syncopal event and the bank. Staff states that pt was unconscious for about 78min  ?Pt denies felling lightheaded or dizzy before event  ? ?Pt denies blood thinners. Pt fell backwards and hit the back of her head. No open wound  ?

## 2021-05-01 NOTE — ED Provider Notes (Signed)
?[facility] EMERGENCY DEPARTMENT ?Provider Note ? ? ?CSN: [id number] ?Arrival date & time: 05/01/21  1251 ? ?  ? ?History ? ?Chief Complaint  ?Patient presents with  ? Loss of Consciousness  ? ? ?Gwendolyn [name] is a 86 y.o. female. ? ?Patient was at the bank.  Patient said that she started to feel lightheaded next and she knew she woke up on the floor.  And she also felt dizzy before she went down.  States she feels fine now but she has a bump on the right part of her scalp in the back.  Patient states that she has passed out several times in the past they have never been able to figure out why.  Patient denies any chest pain any shortness of breath.   ? ?Patient's past medical history states hypertension atrial fibrillation and loss of consciousness and syncope.  Patient is on the blood thinner Eliquis.  Also on lisinopril and Norvasc. ? ? ?  ? ?Home Medications ?Prior to Admission medications   ?Medication Sig Start Date End Date Taking? Authorizing Provider  ?amLODipine (NORVASC) 5 MG tablet Take 5 mg by mouth daily.    [provider]  ?apixaban (ELIQUIS) 5 MG TABS tablet Take 1 tablet (5 mg total) by mouth 2 (two) times daily. ?Patient not taking: Reported on 02/12/2020 03/18/17 06/16/17  Robbie Lis M, PA-C  ?carvedilol (COREG) 6.25 MG tablet Take 6.25 mg by mouth 2 (two) times daily with a meal.    [provider]  ?Cholecalciferol (VITAMIN D3) 50000 units CAPS Take 50,000 Units by mouth once a week. Mondays    [provider]  ?lisinopril (PRINIVIL,ZESTRIL) 40 MG tablet Take 40 mg by mouth daily.    [provider]  ?oxyCODONE-acetaminophen (PERCOCET) 5-325 MG tablet Take 1 tablet by mouth every 6 (six) hours as needed. ?Patient taking differently: Take 1 tablet by mouth every 6 (six) hours as needed for moderate pain. 02/09/20   Charlynne Pander, MD  ?   ? ?Allergies    ?Patient has no known allergies.   ? ?Review of Systems   ?Review of Systems   ?Constitutional:  Negative for chills and fever.  ?HENT:  Negative for ear pain and sore throat.   ?Eyes:  Negative for pain and visual disturbance.  ?Respiratory:  Negative for cough and shortness of breath.   ?Cardiovascular:  Negative for chest pain and palpitations.  ?Gastrointestinal:  Negative for abdominal pain and vomiting.  ?Genitourinary:  Negative for dysuria and hematuria.  ?Musculoskeletal:  Negative for arthralgias and back pain.  ?Skin:  Negative for color change and rash.  ?Neurological:  Positive for dizziness and light-headedness. Negative for seizures and syncope.  ?All other systems reviewed and are negative. ? ?Physical Exam ?Updated Vital Signs ?BP 96/63   Pulse (!) 117   Temp 98.4 ?F (36.9 ?C) (Oral)   Resp 18   SpO2 100%  ?Physical Exam ?Vitals and nursing note reviewed.  ?Constitutional:   ?   General: She is not in acute distress. ?   Appearance: Normal appearance. She is well-developed.  ?HENT:  ?   Head: Normocephalic.  ?   Comments: Patient with some contusion to the right parietal occipital area. ? ?No active bleeding ?   Mouth/Throat:  ?   Mouth: Mucous membranes are moist.  ?Eyes:  ?   Conjunctiva/sclera: Conjunctivae normal.  ?   Pupils: Pupils are equal, round, and reactive to light.  ?Cardiovascular:  ?  Rate and Rhythm: Normal rate and regular rhythm.  ?   Heart sounds: No murmur heard. ?Pulmonary:  ?   Effort: Pulmonary effort is normal. No respiratory distress.  ?   Breath sounds: Normal breath sounds.  ?Abdominal:  ?   Palpations: Abdomen is soft.  ?   Tenderness: There is no abdominal tenderness.  ?Musculoskeletal:     ?   General: No swelling.  ?   Cervical back: Neck supple.  ?Skin: ?   General: Skin is warm and dry.  ?   Capillary Refill: Capillary refill takes less than 2 seconds.  ?Neurological:  ?   General: No focal deficit present.  ?   Mental Status: She is alert and oriented to person, place, and time.  ?   Cranial Nerves: No cranial nerve deficit.  ?    Sensory: No sensory deficit.  ?   Motor: No weakness.  ?   Coordination: Coordination normal.  ?Psychiatric:     ?   Mood and Affect: Mood normal.  ? ? ?ED Results / Procedures / Treatments   ?Labs ?(all labs ordered are listed, but only abnormal results are displayed) ?Labs Reviewed  ?CBC WITH DIFFERENTIAL/PLATELET - Abnormal; Notable for the following components:  ?    Result Value  ? RBC 3.35 (*)   ? Hemoglobin 11.1 (*)   ? HCT 33.7 (*)   ? MCV 100.6 (*)   ? Lymphs Abs 0.5 (*)   ? Monocytes Absolute 1.1 (*)   ? All other components within normal limits  ?CBG MONITORING, ED - Abnormal; Notable for the following components:  ? Glucose-Capillary 109 (*)   ? All other components within normal limits  ?RESP PANEL BY RT-PCR (FLU A&B, COVID) [id number]  ?COMPREHENSIVE METABOLIC PANEL  ? ? ?EKG ?EKG Interpretation ? ?Date/Time:  Thursday May 01 2021 13:00:31 EDT ?Ventricular Rate:  118 ?PR Interval:  142 ?QRS Duration: 95 ?QT Interval:  339 ?QTC Calculation: 473 ?R Axis:   88 ?Text Interpretation: Sinus or ectopic atrial tachycardia Borderline right axis deviation Low voltage, precordial leads Minimal ST depression, inferior leads Confirmed by Vanetta Mulders [phone]) on 05/01/2021 2:57:54 PM ? ?Radiology ?CT Head Wo Contrast ? ?Result Date: 05/01/2021 ?CLINICAL DATA:  Fall.  Head trauma.  Syncopal event. EXAM: CT HEAD WITHOUT CONTRAST TECHNIQUE: Contiguous axial images were obtained from the base of the skull through the vertex without intravenous contrast. RADIATION DOSE REDUCTION: This exam was performed according to the departmental dose-optimization program which includes automated exposure control, adjustment of the mA and/or kV according to patient size and/or use of iterative reconstruction technique. COMPARISON:  02/19/2020 FINDINGS: Brain: Age related atrophy. Chronic small-vessel ischemic changes of the cerebral hemispheric white matter and basal ganglia. No sign of acute infarction, mass lesion, hemorrhage,  hydrocephalus or extra-axial collection. Vascular: There is atherosclerotic calcification of the major vessels at the base of the brain. Skull: Negative Sinuses/Orbits: Small amount of fluid layering in the left maxillary sinus. Orbits negative. Other: Right posterior parietal scalp hematoma. IMPRESSION: Right posterior parietal scalp hematoma. No underlying skull fracture. No traumatic intracranial finding. Chronic small-vessel ischemic changes of the basal ganglia and hemispheric white matter. Electronically Signed   By: Paulina Fusi M.D.   On: 05/01/2021 14:42  ? ?CT Cervical Spine Wo Contrast ? ?Result Date: 05/01/2021 ?CLINICAL DATA:  Syncopal episode. Fall. Trauma to the head and neck. EXAM: CT CERVICAL SPINE WITHOUT CONTRAST TECHNIQUE: Multidetector CT imaging of the cervical spine was performed without intravenous contrast.  Multiplanar CT image reconstructions were also generated. RADIATION DOSE REDUCTION: This exam was performed according to the departmental dose-optimization program which includes automated exposure control, adjustment of the mA and/or kV according to patient size and/or use of iterative reconstruction technique. COMPARISON:  10/26/2019 FINDINGS: Alignment: No traumatic malalignment. Mild curvature convex to the right. Straightening of the normal cervical lordosis. 2 mm degenerative anterolisthesis C4-5. Skull base and vertebrae: No regional fracture or focal bone lesion. Soft tissues and spinal canal: No acute traumatic finding. Disc levels: Chronic degenerative spondylosis from C3-4 through C6-7. Facet osteoarthritis on the left at C3-4 and C4-5. Bony foraminal narrowing on the left at C3-4 and C4-5. Upper chest: Negative Other: None IMPRESSION: No acute or traumatic finding. Chronic degenerative spondylosis and facet arthritis as described. Electronically Signed   By: Paulina Fusi M.D.   On: 05/01/2021 14:44  ? ?DG Chest Port 1 View ? ?Result Date: 05/01/2021 ?CLINICAL DATA:  Syncopal  episode.  Fell. EXAM: PORTABLE CHEST 1 VIEW COMPARISON:  10/26/2019 FINDINGS: Artifact overlies the chest. Heart size is normal. Aortic atherosclerotic calcification is present. The lungs are clear. No pneumothorax or hemot

## 2021-06-10 DIAGNOSIS — E559 Vitamin D deficiency, unspecified: Secondary | ICD-10-CM | POA: Diagnosis not present

## 2021-06-10 DIAGNOSIS — R739 Hyperglycemia, unspecified: Secondary | ICD-10-CM | POA: Diagnosis not present

## 2021-06-10 DIAGNOSIS — I10 Essential (primary) hypertension: Secondary | ICD-10-CM | POA: Diagnosis not present

## 2021-06-10 DIAGNOSIS — E7800 Pure hypercholesterolemia, unspecified: Secondary | ICD-10-CM | POA: Diagnosis not present

## 2021-06-10 DIAGNOSIS — Z0000 Encounter for general adult medical examination without abnormal findings: Secondary | ICD-10-CM | POA: Diagnosis not present

## 2021-06-17 DIAGNOSIS — Z0000 Encounter for general adult medical examination without abnormal findings: Secondary | ICD-10-CM | POA: Diagnosis not present

## 2021-06-17 DIAGNOSIS — I4891 Unspecified atrial fibrillation: Secondary | ICD-10-CM | POA: Diagnosis not present

## 2021-06-17 DIAGNOSIS — Z7901 Long term (current) use of anticoagulants: Secondary | ICD-10-CM | POA: Diagnosis not present

## 2021-06-17 DIAGNOSIS — E559 Vitamin D deficiency, unspecified: Secondary | ICD-10-CM | POA: Diagnosis not present

## 2021-06-17 DIAGNOSIS — R55 Syncope and collapse: Secondary | ICD-10-CM | POA: Diagnosis not present

## 2021-06-17 DIAGNOSIS — E875 Hyperkalemia: Secondary | ICD-10-CM | POA: Diagnosis not present

## 2021-06-17 DIAGNOSIS — E871 Hypo-osmolality and hyponatremia: Secondary | ICD-10-CM | POA: Diagnosis not present

## 2021-06-17 DIAGNOSIS — I10 Essential (primary) hypertension: Secondary | ICD-10-CM | POA: Diagnosis not present

## 2021-06-17 DIAGNOSIS — Z8679 Personal history of other diseases of the circulatory system: Secondary | ICD-10-CM | POA: Diagnosis not present

## 2021-06-23 ENCOUNTER — Other Ambulatory Visit: Payer: Self-pay | Admitting: Registered Nurse

## 2021-06-23 DIAGNOSIS — Z8679 Personal history of other diseases of the circulatory system: Secondary | ICD-10-CM

## 2021-07-03 ENCOUNTER — Emergency Department (HOSPITAL_COMMUNITY): Payer: Medicare Other

## 2021-07-03 ENCOUNTER — Inpatient Hospital Stay (HOSPITAL_COMMUNITY): Payer: Medicare Other

## 2021-07-03 ENCOUNTER — Other Ambulatory Visit: Payer: Self-pay

## 2021-07-03 ENCOUNTER — Inpatient Hospital Stay (HOSPITAL_COMMUNITY)
Admission: EM | Admit: 2021-07-03 | Discharge: 2021-07-11 | DRG: 064 | Disposition: A | Discharge status: Skilled Nursing Facility | Payer: Medicare Other | Attending: Neurology | Admitting: Neurology

## 2021-07-03 DIAGNOSIS — R414 Neurologic neglect syndrome: Secondary | ICD-10-CM | POA: Diagnosis present

## 2021-07-03 DIAGNOSIS — R29810 Facial weakness: Secondary | ICD-10-CM | POA: Diagnosis present

## 2021-07-03 DIAGNOSIS — L8990 Pressure ulcer of unspecified site, unspecified stage: Secondary | ICD-10-CM | POA: Insufficient documentation

## 2021-07-03 DIAGNOSIS — I619 Nontraumatic intracerebral hemorrhage, unspecified: Secondary | ICD-10-CM | POA: Diagnosis present

## 2021-07-03 DIAGNOSIS — D72829 Elevated white blood cell count, unspecified: Secondary | ICD-10-CM | POA: Diagnosis not present

## 2021-07-03 DIAGNOSIS — I69391 Dysphagia following cerebral infarction: Secondary | ICD-10-CM | POA: Diagnosis not present

## 2021-07-03 DIAGNOSIS — I4891 Unspecified atrial fibrillation: Secondary | ICD-10-CM | POA: Diagnosis not present

## 2021-07-03 DIAGNOSIS — I69398 Other sequelae of cerebral infarction: Secondary | ICD-10-CM | POA: Diagnosis not present

## 2021-07-03 DIAGNOSIS — Z7401 Bed confinement status: Secondary | ICD-10-CM | POA: Diagnosis not present

## 2021-07-03 DIAGNOSIS — R001 Bradycardia, unspecified: Secondary | ICD-10-CM | POA: Diagnosis not present

## 2021-07-03 DIAGNOSIS — Z741 Need for assistance with personal care: Secondary | ICD-10-CM | POA: Diagnosis not present

## 2021-07-03 DIAGNOSIS — E871 Hypo-osmolality and hyponatremia: Secondary | ICD-10-CM | POA: Diagnosis not present

## 2021-07-03 DIAGNOSIS — L89892 Pressure ulcer of other site, stage 2: Secondary | ICD-10-CM | POA: Diagnosis present

## 2021-07-03 DIAGNOSIS — G459 Transient cerebral ischemic attack, unspecified: Secondary | ICD-10-CM | POA: Diagnosis not present

## 2021-07-03 DIAGNOSIS — I10 Essential (primary) hypertension: Secondary | ICD-10-CM | POA: Diagnosis present

## 2021-07-03 DIAGNOSIS — I959 Hypotension, unspecified: Secondary | ICD-10-CM | POA: Diagnosis not present

## 2021-07-03 DIAGNOSIS — R4781 Slurred speech: Secondary | ICD-10-CM | POA: Diagnosis present

## 2021-07-03 DIAGNOSIS — R4182 Altered mental status, unspecified: Secondary | ICD-10-CM | POA: Diagnosis not present

## 2021-07-03 DIAGNOSIS — I610 Nontraumatic intracerebral hemorrhage in hemisphere, subcortical: Secondary | ICD-10-CM | POA: Diagnosis not present

## 2021-07-03 DIAGNOSIS — I63411 Cerebral infarction due to embolism of right middle cerebral artery: Secondary | ICD-10-CM | POA: Diagnosis present

## 2021-07-03 DIAGNOSIS — I4819 Other persistent atrial fibrillation: Secondary | ICD-10-CM | POA: Diagnosis not present

## 2021-07-03 DIAGNOSIS — R000 Tachycardia, unspecified: Secondary | ICD-10-CM | POA: Diagnosis not present

## 2021-07-03 DIAGNOSIS — E785 Hyperlipidemia, unspecified: Secondary | ICD-10-CM | POA: Diagnosis present

## 2021-07-03 DIAGNOSIS — R29716 NIHSS score 16: Secondary | ICD-10-CM | POA: Diagnosis not present

## 2021-07-03 DIAGNOSIS — G8194 Hemiplegia, unspecified affecting left nondominant side: Secondary | ICD-10-CM | POA: Diagnosis present

## 2021-07-03 DIAGNOSIS — R2689 Other abnormalities of gait and mobility: Secondary | ICD-10-CM | POA: Diagnosis not present

## 2021-07-03 DIAGNOSIS — D638 Anemia in other chronic diseases classified elsewhere: Secondary | ICD-10-CM | POA: Diagnosis not present

## 2021-07-03 DIAGNOSIS — I6340 Cerebral infarction due to embolism of unspecified cerebral artery: Secondary | ICD-10-CM | POA: Diagnosis not present

## 2021-07-03 DIAGNOSIS — Z79899 Other long term (current) drug therapy: Secondary | ICD-10-CM | POA: Diagnosis not present

## 2021-07-03 DIAGNOSIS — I482 Chronic atrial fibrillation, unspecified: Secondary | ICD-10-CM | POA: Diagnosis not present

## 2021-07-03 DIAGNOSIS — R1312 Dysphagia, oropharyngeal phase: Secondary | ICD-10-CM | POA: Diagnosis not present

## 2021-07-03 DIAGNOSIS — R262 Difficulty in walking, not elsewhere classified: Secondary | ICD-10-CM | POA: Diagnosis not present

## 2021-07-03 DIAGNOSIS — I6389 Other cerebral infarction: Secondary | ICD-10-CM | POA: Diagnosis not present

## 2021-07-03 DIAGNOSIS — L89156 Pressure-induced deep tissue damage of sacral region: Secondary | ICD-10-CM | POA: Diagnosis present

## 2021-07-03 DIAGNOSIS — R531 Weakness: Secondary | ICD-10-CM | POA: Diagnosis not present

## 2021-07-03 DIAGNOSIS — R1310 Dysphagia, unspecified: Secondary | ICD-10-CM | POA: Diagnosis present

## 2021-07-03 DIAGNOSIS — R633 Feeding difficulties, unspecified: Secondary | ICD-10-CM | POA: Diagnosis not present

## 2021-07-03 DIAGNOSIS — E7800 Pure hypercholesterolemia, unspecified: Secondary | ICD-10-CM | POA: Diagnosis not present

## 2021-07-03 DIAGNOSIS — I69354 Hemiplegia and hemiparesis following cerebral infarction affecting left non-dominant side: Secondary | ICD-10-CM | POA: Diagnosis not present

## 2021-07-03 DIAGNOSIS — R29717 NIHSS score 17: Secondary | ICD-10-CM | POA: Diagnosis present

## 2021-07-03 DIAGNOSIS — D649 Anemia, unspecified: Secondary | ICD-10-CM | POA: Diagnosis not present

## 2021-07-03 DIAGNOSIS — Z7982 Long term (current) use of aspirin: Secondary | ICD-10-CM

## 2021-07-03 DIAGNOSIS — Z431 Encounter for attention to gastrostomy: Secondary | ICD-10-CM | POA: Diagnosis not present

## 2021-07-03 DIAGNOSIS — M6281 Muscle weakness (generalized): Secondary | ICD-10-CM | POA: Diagnosis not present

## 2021-07-03 DIAGNOSIS — R9431 Abnormal electrocardiogram [ECG] [EKG]: Secondary | ICD-10-CM | POA: Diagnosis not present

## 2021-07-03 LAB — LACTIC ACID, PLASMA: Lactic Acid, Venous: 1.4 mmol/L (ref 0.5–1.9)

## 2021-07-03 LAB — COMPREHENSIVE METABOLIC PANEL
ALT: 41 U/L (ref 0–44)
AST: 86 U/L — ABNORMAL HIGH (ref 15–41)
Albumin: 3.6 g/dL (ref 3.5–5.0)
Alkaline Phosphatase: 80 U/L (ref 38–126)
Anion gap: 16 — ABNORMAL HIGH (ref 5–15)
BUN: 20 mg/dL (ref 8–23)
CO2: 19 mmol/L — ABNORMAL LOW (ref 22–32)
Calcium: 8.8 mg/dL — ABNORMAL LOW (ref 8.9–10.3)
Chloride: 97 mmol/L — ABNORMAL LOW (ref 98–111)
Creatinine, Ser: 1.03 mg/dL — ABNORMAL HIGH (ref 0.44–1.00)
GFR, Estimated: 54 mL/min — ABNORMAL LOW (ref >60)
Glucose, Bld: 126 mg/dL — ABNORMAL HIGH (ref 70–99)
Potassium: 4.4 mmol/L (ref 3.5–5.1)
Sodium: 132 mmol/L — ABNORMAL LOW (ref 135–145)
Total Bilirubin: 1.7 mg/dL — ABNORMAL HIGH (ref 0.3–1.2)
Total Protein: 7 g/dL (ref 6.5–8.1)

## 2021-07-03 LAB — CBC WITH DIFFERENTIAL/PLATELET
Abs Immature Granulocytes: 0.1 10*3/uL — ABNORMAL HIGH (ref 0.00–0.07)
Basophils Absolute: 0 10*3/uL (ref 0.0–0.1)
Basophils Relative: 0 %
Eosinophils Absolute: 0 10*3/uL (ref 0.0–0.5)
Eosinophils Relative: 0 %
HCT: 41.9 % (ref 36.0–46.0)
Hemoglobin: 13.9 g/dL (ref 12.0–15.0)
Immature Granulocytes: 1 %
Lymphocytes Relative: 3 %
Lymphs Abs: 0.6 10*3/uL — ABNORMAL LOW (ref 0.7–4.0)
MCH: 32.8 pg (ref 26.0–34.0)
MCHC: 33.2 g/dL (ref 30.0–36.0)
MCV: 98.8 fL (ref 80.0–100.0)
Monocytes Absolute: 1.5 10*3/uL — ABNORMAL HIGH (ref 0.1–1.0)
Monocytes Relative: 9 %
Neutro Abs: 14.5 10*3/uL — ABNORMAL HIGH (ref 1.7–7.7)
Neutrophils Relative %: 87 %
Platelets: 236 10*3/uL (ref 150–400)
RBC: 4.24 MIL/uL (ref 3.87–5.11)
RDW: 13 % (ref 11.5–15.5)
WBC: 16.7 10*3/uL — ABNORMAL HIGH (ref 4.0–10.5)
nRBC: 0 % (ref 0.0–0.2)

## 2021-07-03 LAB — URINALYSIS, ROUTINE W REFLEX MICROSCOPIC
Bacteria, UA: NONE SEEN
Bilirubin Urine: NEGATIVE
Glucose, UA: NEGATIVE mg/dL
Ketones, ur: 80 mg/dL — AB
Leukocytes,Ua: NEGATIVE
Nitrite: NEGATIVE
Protein, ur: 100 mg/dL — AB
Specific Gravity, Urine: 1.029 (ref 1.005–1.030)
pH: 5 (ref 5.0–8.0)

## 2021-07-03 LAB — RAPID URINE DRUG SCREEN, HOSP PERFORMED
Amphetamines: NOT DETECTED
Barbiturates: NOT DETECTED
Benzodiazepines: NOT DETECTED
Cocaine: NOT DETECTED
Opiates: NOT DETECTED
Tetrahydrocannabinol: NOT DETECTED

## 2021-07-03 LAB — PROTIME-INR
INR: 1.2 (ref 0.8–1.2)
Prothrombin Time: 14.8 seconds (ref 11.4–15.2)

## 2021-07-03 LAB — CBG MONITORING, ED: Glucose-Capillary: 127 mg/dL — ABNORMAL HIGH (ref 70–99)

## 2021-07-03 LAB — CK: Total CK: 3851 U/L — ABNORMAL HIGH (ref 38–234)

## 2021-07-03 LAB — MRSA NEXT GEN BY PCR, NASAL: MRSA by PCR Next Gen: NOT DETECTED

## 2021-07-03 LAB — AMMONIA: Ammonia: 21 umol/L (ref 9–35)

## 2021-07-03 MED ORDER — STROKE: EARLY STAGES OF RECOVERY BOOK
Freq: Once | Status: AC
  Filled 2021-07-03: qty 1

## 2021-07-03 MED ORDER — DILTIAZEM LOAD VIA INFUSION
10.0000 mg | Freq: Once | INTRAVENOUS | Status: AC
  Administered 2021-07-03: 10 mg via INTRAVENOUS
  Filled 2021-07-03: qty 10

## 2021-07-03 MED ORDER — IOHEXOL 350 MG/ML SOLN
75.0000 mL | Freq: Once | INTRAVENOUS | Status: AC | PRN
  Administered 2021-07-03: 75 mL via INTRAVENOUS

## 2021-07-03 MED ORDER — PANTOPRAZOLE SODIUM 40 MG IV SOLR
40.0000 mg | Freq: Every day | INTRAVENOUS | Status: DC
  Administered 2021-07-03 – 2021-07-04 (×2): 40 mg via INTRAVENOUS
  Filled 2021-07-03 (×2): qty 10

## 2021-07-03 MED ORDER — CHLORHEXIDINE GLUCONATE CLOTH 2 % EX PADS
6.0000 | MEDICATED_PAD | Freq: Every day | CUTANEOUS | Status: DC
  Administered 2021-07-03 – 2021-07-07 (×5): 6 via TOPICAL

## 2021-07-03 MED ORDER — ACETAMINOPHEN 650 MG RE SUPP: 650.0000 mg | RECTAL | Status: DC | PRN

## 2021-07-03 MED ORDER — SODIUM CHLORIDE 0.9 % IV BOLUS
500.0000 mL | Freq: Once | INTRAVENOUS | Status: AC
  Administered 2021-07-03: 500 mL via INTRAVENOUS

## 2021-07-03 MED ORDER — SODIUM CHLORIDE 0.9 % IV SOLN: INTRAVENOUS | Status: DC

## 2021-07-03 MED ORDER — ACETAMINOPHEN 160 MG/5ML PO SOLN
650.0000 mg | ORAL | Status: DC | PRN
  Administered 2021-07-05 – 2021-07-06 (×2): 650 mg
  Filled 2021-07-03 (×2): qty 20.3

## 2021-07-03 MED ORDER — LABETALOL HCL 5 MG/ML IV SOLN: 10.0000 mg | INTRAVENOUS | Status: DC | PRN

## 2021-07-03 MED ORDER — SENNOSIDES-DOCUSATE SODIUM 8.6-50 MG PO TABS: 1.0000 | ORAL_TABLET | Freq: Two times a day (BID) | ORAL | Status: DC

## 2021-07-03 MED ORDER — ACETAMINOPHEN 325 MG PO TABS: 650.0000 mg | ORAL_TABLET | ORAL | Status: DC | PRN

## 2021-07-03 MED ORDER — DILTIAZEM HCL-DEXTROSE 125-5 MG/125ML-% IV SOLN (PREMIX)
5.0000 mg/h | INTRAVENOUS | Status: DC
  Administered 2021-07-03: 5 mg/h via INTRAVENOUS
  Administered 2021-07-03: 7.5 mg/h via INTRAVENOUS
  Filled 2021-07-03 (×2): qty 125

## 2021-07-03 NOTE — Progress Notes (Signed)
Patient with RAF HR 140's-160's upon arrival to unit. Cardizem drip titrated as appropriate with HR decreasing to low 100's. SBP dropped to 93/67 MAP 77, goal SBP 130-150. NP Gevena Mart aware, orders noted.

## 2021-07-03 NOTE — Progress Notes (Signed)
IV Team to bedside per consult, IV access already obtained by ED RN

## 2021-07-03 NOTE — H&P (Addendum)
Neurology H&P  CC: AMS  History is obtained from:Gwendolyn [name] and chart   HPI: Gwendolyn [name] is a 86 y.o. female with past medical history of A. Fib not on [facility] (Gwendolyn [name] states she has not taken Eliquis for years, was stopped by MD), HTN, syncope, and lives at a hotel, presents to the [facility] ED after being found by hotel staff with Sparkman. Per report LSN 2 days ago, she presents with left side weakness, and left facial droop and slurred speech. She is in A fib with RVR in The ED currently on a cardizem gtt. CT Head with acute right BG hemorrhage. She denies recent headache, falls, or head trauma. She states she is having a hard time using her left side I spoke with Gwendolyn [name]'s niece Theola [name] [phone], next of kin and updated her on Gwendolyn [name]. Patients sister who is listed in chart has Dementia.  LKW: 2 days ago  tpa given?: no, ICH  ICH Score: 1 Modified Rankin Scale: 0-Completely asymptomatic and back to baseline post- stroke  ROS: A 14 point ROS was performed and is negative except as noted in the HPI.    Past Medical History:  Diagnosis Date   A-fib [facility])    Atrial fibrillation (HCC)    Hypertension    Loss of consciousness ([place])    Syncope      Family History  Problem Relation Age of Onset   Parkinson's disease Mother    Lung cancer Father    Alzheimer's disease Sister    Cirrhosis Brother    Diabetes Sister    Arthritis Sister      Social History:  reports that she has never smoked. She has never used smokeless tobacco. She reports current alcohol use. She reports that she does not use drugs.   Exam: Current vital signs: BP 108/78   Pulse (!) 156   Temp 97.7 F (36.5 C) (Rectal)   Resp 19   Ht 5\' 1"  (1.549 m)   Wt 53.1 kg   SpO2 95%   BMI 22.11 kg/m  Vital signs in last 24 hours: Temp:  [97.7 F (36.5 C)] 97.7 F (36.5 C) (05/25 1154) Pulse Rate:  [152-156] 156 (05/25 1445) Resp:  [15-19] 19 (05/25 1445) BP: (108-133)/(78-99) 108/78 (05/25 1445) SpO2:  [95  %-98 %] 95 % (05/25 1445) Weight:  [53.1 kg] 53.1 kg (05/25 1139)  Physical Exam  Constitutional: Appears well-developed and well-nourished.  Psych: Affect appropriate to situation Eyes: No scleral injection HENT: No OP obstrucion Head: Normocephalic.  Cardiovascular: A fib on monitor  Respiratory: Effort normal and breath sounds normal to anterior ascultation GI: Soft.  No distension. There is no tenderness.  Skin: WDI  Neuro: Mental Status: Gwendolyn [name] is awake, alert, oriented to person, place, month, year, and situation. Gwendolyn [name] is able to give a clear and coherent history. She has dysarthria and neglect  Cranial Nerves: II: Visual Fields with left hemianopsia. Pupils are equal, round, and reactive to light.   III,IV, VI: EOM right gaze preference, will not cross midline, without ptosis or diploplia.  V: Facial sensation is symmetric to temperature VII: left facial droop  VIII: hearing is intact to voice X: Uvula elevates symmetrically XI: Shoulder shrug is symmetric. XII: tongue is midline without atrophy or fasciculations.  Motor: Tone is normal. Bulk is normal. 5/5 right arm and right leg. Left arm 3/5, left leg can not lift off bed, has horizontal movement and can wiggle toes Sensory: Sensation is symmetric to light touch and temperature  in the arms and legs. Cerebellar: FNF and HKS are intact bilaterally  NIHSS: 1a Level of Conscious.: 0 1b LOC Questions: 0 1c LOC Commands: 0 2 Best Gaze: 2 3 Visual: 1 4 Facial Palsy: 2 5a Motor Arm - left: 2 5b Motor Arm - Right: 0 6a Motor Leg - Left: 3 6b Motor Leg - Right: 0 7 Limb Ataxia: 0 8 Sensory: 0 9 Best Language: 0 10 Dysarthria: 1 11 Extinct. and Inatten.: 1 TOTAL: 12   I have reviewed labs in epic and the results pertinent to this consultation are: NA 132 BUN 20 Cr 1.03 AST 86 tBili 1.7 WBC 16.7  I have reviewed the images obtained:  CT head Acute intraparenchymal hemorrhages within the right basal  ganglia, detailed above. Associated edema without significant mass effect  CTA head/neck  Official read pending   Primary Diagnosis:  Acute nontraumatic right basal ganglia hemorrhage not related to circulating anticoagulants   Secondary Diagnosis: Essential (primary) hypertension and Paroxysmal atrial fibrillation Leukocytosis Elevated CK   Recommendations: - admit to Neuro ICU  - SBP goal 130-150 - labetalol 10mg  IV PRN to maintain BP goal  - EKG - CXR  - UA, UDS  - AM labs; BMP, CBC, MG, Phos and CK  - Stability CT scan @ 2100  - NS @ 75cc/hr  - cardizem gtt to control HR <110. May need cardiology consult  - bedside swallow screen, if passes will start diet, if not may need to place NG tube - HgbA1c, fasting lipid panel - Frequent neuro checks - Echocardiogram - Risk factor modification - Telemetry monitoring - PT consult, OT consult, Speech consult - Stroke team to follow   Beulah Gandy DNP, ACNPC-AG

## 2021-07-03 NOTE — ED Provider Notes (Signed)
[facility] EMERGENCY DEPARTMENT Provider Note   CSN: [id number] Arrival date & time: 07/03/21  1129     History Afib, HTN,  Chief Complaint  Patient presents with   Altered Mental Status    Cardelia [name] is a 86 y.o. female.  86 y.o female with a PMH of Afib on Eliquis, HTN, Syncope presents to the ED via EMS for AMS. Patient was found on the ground of the room she was staying in, last seen by hotel staff 2 days ago.  Patient is alert and oriented x4, however is unable to lift or move her left arm or leg.  She denies any recent illness or infection.  Patient does have documented blood thinner Eliquis on her chart, however unsure whether she is taking this. Level 5 caveat due to AMS.    The history is provided by the patient, medical records and the EMS personnel.  Altered Mental Status Presenting symptoms: unresponsiveness   Severity:  Moderate Most recent episode:  More than 2 days ago Episode history:  Single Timing:  Constant Progression:  Worsening Chronicity:  New Context: not taking medications as prescribed       Home Medications Prior to Admission medications   Medication Sig Start Date End Date Taking? Authorizing Provider  amLODipine (NORVASC) 5 MG tablet Take 5 mg by mouth daily.    [provider]  apixaban (ELIQUIS) 5 MG TABS tablet Take 1 tablet (5 mg total) by mouth 2 (two) times daily. Patient not taking: Reported on 02/12/2020 03/18/17 06/16/17  Lyda Jester M, PA-C  carvedilol (COREG) 6.25 MG tablet Take 6.25 mg by mouth 2 (two) times daily with a meal.    [provider]  Cholecalciferol (VITAMIN D3) 50000 units CAPS Take 50,000 Units by mouth once a week. Mondays    [provider]  lisinopril (PRINIVIL,ZESTRIL) 40 MG tablet Take 40 mg by mouth daily.    [provider]  oxyCODONE-acetaminophen (PERCOCET) 5-325 MG tablet Take 1 tablet by mouth every 6 (six) hours as needed. Patient taking  differently: Take 1 tablet by mouth every 6 (six) hours as needed for moderate pain. 02/09/20   Drenda Freeze, MD      Allergies    Patient has no known allergies.    Review of Systems   Review of Systems  Unable to perform ROS: Mental status change   Physical Exam Updated Vital Signs BP (!) 133/99 (BP Location: Right Arm)   Pulse (!) 154   Temp 97.7 F (36.5 C) (Rectal)   Resp 16   Ht 5\' 1"  (1.549 m)   Wt 53.1 kg   SpO2 98%   BMI 22.11 kg/m  Physical Exam Vitals and nursing note reviewed. Exam conducted with a chaperone present.  Constitutional:      Appearance: She is ill-appearing.  HENT:     Head: Normocephalic.     Comments: Mottled skin to the right side of the face. No bruising noted.  Eyes:     Pupils: Pupils are equal, round, and reactive to light.  Neck:     Comments: Deviation to the right side with no range of motion of the neck, patient cannot cross midline onto left side.  Cardiovascular:     Rate and Rhythm: Tachycardia present. Rhythm irregular.  Pulmonary:     Effort: Pulmonary effort is normal.  Abdominal:     General: Abdomen is flat.     Tenderness: There is no abdominal tenderness.  Genitourinary:  Comments: Large decubitus ulcer to the sacral area with removal of superficial skin removed.  Skin:    General: Skin is warm and dry.  Neurological:     Mental Status: She is oriented to person, place, and time.     Cranial Nerves: Facial asymmetry present. No dysarthria.     Motor: Weakness present.     Comments: Facial asymmetry noted, with right sided facial droop.  LEFT upper and lower extremity weakness on exam. 0/5 strength with flexion and extension of the upper extremity.      ED Results / Procedures / Treatments   Labs (all labs ordered are listed, but only abnormal results are displayed) Labs Reviewed  COMPREHENSIVE METABOLIC PANEL - Abnormal; Notable for the following components:      Result Value   Sodium 132 (*)     Chloride 97 (*)    CO2 19 (*)    Glucose, Bld 126 (*)    Creatinine, Ser 1.03 (*)    Calcium 8.8 (*)    AST 86 (*)    Total Bilirubin 1.7 (*)    GFR, Estimated 54 (*)    Anion gap 16 (*)    All other components within normal limits  CBC WITH DIFFERENTIAL/PLATELET - Abnormal; Notable for the following components:   WBC 16.7 (*)    Neutro Abs 14.5 (*)    Lymphs Abs 0.6 (*)    Monocytes Absolute 1.5 (*)    Abs Immature Granulocytes 0.10 (*)    All other components within normal limits  CK - Abnormal; Notable for the following components:   Total CK 3,851 (*)    All other components within normal limits  CBG MONITORING, ED - Abnormal; Notable for the following components:   Glucose-Capillary 127 (*)    All other components within normal limits  CULTURE, BLOOD (ROUTINE X 2)  CULTURE, BLOOD (ROUTINE X 2)  PROTIME-INR  AMMONIA  URINALYSIS, ROUTINE W REFLEX MICROSCOPIC  RAPID URINE DRUG SCREEN, HOSP PERFORMED  LACTIC ACID, PLASMA  LACTIC ACID, PLASMA  CBG MONITORING, ED    EKG EKG Interpretation  Date/Time:  Thursday Jul 03 2021 11:41:26 EDT Ventricular Rate:  160 PR Interval:  188 QRS Duration: 120 QT Interval:  291 QTC Calculation: 475 R Axis:   102 Text Interpretation: Atrial tachycardia Nonspecific intraventricular conduction delay Repolarization abnormality, prob rate related Artifact in lead(s) I II III aVR aVL aVF V1 V2 V3 V4 V5 V6 Confirmed by Nanda Quinton [phone]) on 07/03/2021 12:56:39 PM  Radiology CT HEAD WO CONTRAST (5MM)  Result Date: 07/03/2021 CLINICAL DATA:  Neuro deficit, acute, stroke suspected EXAM: CT HEAD WITHOUT CONTRAST TECHNIQUE: Contiguous axial images were obtained from the base of the skull through the vertex without intravenous contrast. RADIATION DOSE REDUCTION: This exam was performed according to the departmental dose-optimization program which includes automated exposure control, adjustment of the mA and/or kV according to patient size and/or  use of iterative reconstruction technique. COMPARISON:  CT head May 01, 2021. FINDINGS: Brain: Multiple acute hemorrhages in the right basal ganglia, including a hemorrhage in the right putamen that measures approximately 2.9 x 0.7 x 1.8 cm and smaller hemorrhage in the right caudate that measures approximately 1.4 x 0.8 x 1.1 cm. Surrounding edema without significant mass effect. No evidence of acute large vascular territory infarct. Patchy white matter hypodensities, nonspecific but compatible with chronic microvascular ischemic disease. No hydrocephalus or obvious mass lesion. Vascular: Calcific intracranial atherosclerosis. Skull: No acute fracture. Sinuses/Orbits: Clear sinuses.  No  acute orbital findings. Other: No mastoid effusions. IMPRESSION: Acute intraparenchymal hemorrhages within the right basal ganglia, detailed above. Associated edema without significant mass effect. Findings discussed with Dr. Irene Pap via telephone at 12:19 PM. Electronically Signed   By: Margaretha Sheffield M.D.   On: 07/03/2021 12:22   DG Chest Portable 1 View  Result Date: 07/03/2021 CLINICAL DATA:  Altered mental status EXAM: PORTABLE CHEST 1 VIEW COMPARISON:  None Available. FINDINGS: Normal mediastinum and cardiac silhouette. Normal pulmonary vasculature. No evidence of effusion, infiltrate, or pneumothorax. Atherosclerotic calcification of the aorta. No acute bony abnormality. IMPRESSION: No acute cardiopulmonary process. Aortic Atherosclerosis (ICD10-I70.0). Electronically Signed   By: Suzy Bouchard M.D.   On: 07/03/2021 13:08    Procedures .Critical Care Performed by: Janeece Fitting, PA-C Authorized by: Janeece Fitting, PA-C   Critical care provider statement:    Critical care time (minutes):  45   Critical care start time:  07/03/2021 1:00 PM   Critical care end time:  07/03/2021 1:45 PM   Critical care was necessary to treat or prevent imminent or life-threatening deterioration of the following conditions:  CNS  failure or compromise   Critical care was time spent personally by me on the following activities:  Discussions with consultants, evaluation of patient's response to treatment, examination of patient, re-evaluation of patient's condition and review of old charts   Care discussed with: admitting provider      Medications Ordered in ED Medications  diltiazem (CARDIZEM) 1 mg/mL load via infusion 10 mg (10 mg Intravenous Bolus from Bag 07/03/21 1307)    And  diltiazem (CARDIZEM) 125 mg in dextrose 5% 125 mL (1 mg/mL) infusion (5 mg/hr Intravenous New Bag/Given 07/03/21 1307)  sodium chloride 0.9 % bolus 500 mL (0 mLs Intravenous Stopped 07/03/21 1350)    ED Course/ Medical Decision Making/ A&P Clinical Course as of 07/03/21 1409  Thu Jul 03, 2021  1403 CK Total(!): 3,851 Found down, last seen 2 days ago [JS]    Clinical Course User Index [JS] Janeece Fitting, PA-C                           Medical Decision Making Amount and/or Complexity of Data Reviewed Labs: ordered. Radiology: ordered.  Risk Prescription drug management.   This patient presents to the ED for concern of AMS, this involves a number of treatment options, and is a complaint that carries with it a HIGH risk of complications and morbidity.  The differential diagnosis includes CVA, hemorrhage, trauma, seizure versus sepsis.    Co morbidities: Discussed in HPI   Brief History:  Patient presents to the ED via EMS after being found down on the [place] floor.  Last seen normal by staff approximately 2 days ago, usually patient comes out to drink coffee in the morning.  She does have facial droop on arrival, was incontinent on arrival along with confused ANO x3.  EMR reviewed including pt PMHx, past surgical history and past visits to ER.   See HPI for more details   Lab Tests:  I ordered and independently interpreted labs.  The pertinent results include:    CK elevated at 3, 851.  Blood glucose was within  normal limits.  CBC with a white count of 16.7, hemoglobin is within normal limits.  CMP with slight decrease in sodium, creatinine level slightly elevated.  AST is elevated at 86, no known history of alcohol abuse.  Ammonia levels within normal limits.  ET/INR within normal limits.  UA along with UDS are currently pending.   Imaging Studies:  CT head showed: Acute intraparenchymal hemorrhages within the right basal ganglia,  detailed above. Associated edema without significant mass effect    Cardiac Monitoring:  The patient was maintained on a cardiac monitor.  I personally viewed and interpreted the cardiac monitored which showed an underlying rhythm of: Afib HR 160's EKG non-ischemic   Medicines ordered:  I ordered medication including bolus, diltiazem  for symptomatic control Reevaluation of the patient after these medicines showed that the patient stayed the same I have reviewed the patients home medicines and have made adjustments as needed   Critical Interventions:  Patient given diltiazem loading dose with a HR of 160's.    Consults:  12:44 PM I requested consultation with Neurology Dr. Theda Sers,  and discussed lab and imaging findings as well as pertinent plan - they recommend: CTA head/neck along with admission. Hold BP 130/90.     Reevaluation:  After the interventions noted above I re-evaluated patient and found that they have :stayed the same   Social Determinants of Health:  Social work/case management involved    Problem List / ED Course:  Patient presents via EMS for altered mental status.  CT remarkable for acute intraparenchymal hemorrhage, patient previously on Eliquis.  Discussed case with neurology who does not feel any reversal at this time as patient reported that she has not been taking this medication.  No signs of infection, she is afebrile even with a rectal temp taken.  White blood cell count of 16,000, since labs were ordered.  Some old labs  on her including LFTs with a AST greater than ALT 221 ratio, unknown if prior history of alcohol abuse.  On exam patient does not look to the left there is some right neck deviation, she does not cross the midline.  I did discuss case with neurology Dr. Theda Sers who will admit patient for further management.  She was also given fluid bolus, diltiazem to help with symptomatic control.  Consistently in A-fib with a rate in the 160s while in the ED but stable.   Dispostion:  After consideration of the diagnostic results and the patients response to treatment, I feel that the patent would benefit from admission by neurology for further management of her acute intraparenchymal hemorrhage.  Will likely need social work involved this patient currently lives in [facility] [address] [facility] room.   Portions of this note were generated with Lobbyist. Dictation errors may occur despite best attempts at proofreading.   Final Clinical Impression(s) / ED Diagnoses Final diagnoses:  Altered mental status, unspecified altered mental status type    Rx / DC Orders ED Discharge Orders     None         Janeece Fitting, PA-C 07/03/21 1409    Long, Wonda [facility], MD 07/06/21 [id number]

## 2021-07-03 NOTE — Evaluation (Signed)
Clinical/Bedside Swallow Evaluation Patient Details  Name: Gwendolyn [name] MRN: [id number] Date of Birth: 09-16-1935  Today's Date: 07/03/2021 Time: SLP Start Time (ACUTE ONLY): 1705 SLP Stop Time (ACUTE ONLY): 1720 SLP Time Calculation (min) (ACUTE ONLY): 15 min  Past Medical History:  Past Medical History:  Diagnosis Date   A-fib (HCC)    Atrial fibrillation (HCC)    Hypertension    Loss of consciousness (HCC)    Syncope    Past Surgical History:  Past Surgical History:  Procedure Laterality Date   BARTHOLIN GLAND CYST EXCISION     HPI:  Patient is an 86 y.o. female with PMH: a fib not on anticougulation, HTN, syncope. She lives in a hotel and presented to [facility] ED after being found by hotel staff with AMS. Per report, last known well was two days prior. She presented to ED with left side weakness, left side facial droop and slurred speech. CT head showed acute right basal ganglia hemorrhage. She failed the Yale swallow screen with nursing and has been NPO.    Assessment / Plan / Recommendation  Clinical Impression  Patient currently presenting with clinical s/s of dysphagia as per this bedside/clinical swallow evaluation. SLP tested her with cup sips of thin liquids (water) and nectar thick liquids. With thin liquids she exhibited both delayed and immediate cough response with eyes watering and patient's voice strained. With nectar thick liquids patient exhibited delayed coughing but immediate, mild throat clearing. SLP suspects patient has a delayed initiation of swallow and likely some decreased oropharyngeal sensation leading to cough and throat clearing responses. SLP did not assess patient with other consistencies as aspiration risk was deemed to be mod-severe. Recommendation is for her to remain NPO at this time and SLP will f/u next date and can objectively evaluate her swallow function via MBS. SLP Visit Diagnosis: Dysphagia, unspecified (R13.10)    Aspiration Risk  Severe  aspiration risk    Diet Recommendation NPO   Medication Administration: Via alternative means    Other  Recommendations Oral Care Recommendations: Oral care QID;Staff/trained caregiver to provide oral care    Recommendations for follow up therapy are one component of a multi-disciplinary discharge planning process, led by the attending physician.  Recommendations may be updated based on patient status, additional functional criteria and insurance authorization.  Follow up Recommendations Acute inpatient rehab (3hours/day)      Assistance Recommended at Discharge Frequent or constant Supervision/Assistance  Functional Status Assessment Patient has had a recent decline in their functional status and demonstrates the ability to make significant improvements in function in a reasonable and predictable amount of time.  Frequency and Duration min 2x/week  2 weeks       Prognosis Prognosis for Safe Diet Advancement: Good      Swallow Study   General Date of Onset: 07/03/21 HPI: Patient is an 86 y.o. female with PMH: a fib not on anticougulation, HTN, syncope. She lives in a hotel and presented to [facility] ED after being found by hotel staff with AMS. Per report, last known well was two days prior. She presented to ED with left side weakness, left side facial droop and slurred speech. CT head showed acute right basal ganglia hemorrhage. She failed the Yale swallow screen with nursing and has been NPO. Type of Study: Bedside Swallow Evaluation Previous Swallow Assessment: none found Diet Prior to this Study: NPO Temperature Spikes Noted: No Respiratory Status: Room air History of Recent Intubation: No Behavior/Cognition: Alert;Cooperative;Pleasant mood Oral Cavity Assessment: Dry;Dried  secretions Oral Care Completed by SLP: Yes Oral Cavity - Dentition: Adequate natural dentition Vision: Impaired for self-feeding Self-Feeding Abilities: Needs assist;Needs set up Patient Positioning: Upright  in bed Baseline Vocal Quality: Normal Volitional Cough: Weak Volitional Swallow: Unable to elicit    Oral/Motor/Sensory Function Overall Oral Motor/Sensory Function: Moderate impairment Facial ROM: Reduced left;Suspected CN VII (facial) dysfunction Facial Symmetry: Abnormal symmetry left;Suspected CN VII (facial) dysfunction Facial Strength: Reduced left;Suspected CN VII (facial) dysfunction Facial Sensation: Reduced left;Suspected CN V (Trigeminal) dysfunction Lingual Strength: Reduced   Ice Chips Ice chips: Impaired Oral Phase Impairments: Impaired mastication;Poor awareness of bolus;Reduced labial seal;Reduced lingual movement/coordination Oral Phase Functional Implications: Right anterior spillage Pharyngeal Phase Impairments: Suspected delayed Swallow   Thin Liquid Thin Liquid: Impaired Presentation: Cup Oral Phase Impairments: Reduced labial seal Pharyngeal  Phase Impairments: Suspected delayed Swallow;Cough - Delayed;Cough - Immediate    Nectar Thick Nectar Thick Liquid: Impaired Presentation: Cup Oral Phase Impairments: Reduced labial seal Pharyngeal Phase Impairments: Suspected delayed Swallow;Cough - Delayed   Honey Thick Honey Thick Liquid: Not tested   Puree Puree: Not tested   Solid     Solid: Not tested      Angela Nevin, MA, CCC-SLP Speech Therapy

## 2021-07-03 NOTE — ED Triage Notes (Addendum)
Pt BIB ems due to ams. Pt [place] [facility] and staff states the LSN was 2 days ago. Pt presents with left sided facial droop, confusion, and incontinence. Axox3. VSS. Pt also has hx of afib and presents in afib with HR of 150-160.

## 2021-07-03 NOTE — Progress Notes (Signed)
BP remains lower than goal at 105/63 MAP 72 post IVF bolus HR 120's on Cardi gtt. NP Angelique Blonder aware of lower than goal SBP's, ok with SBP> 100, MAP >65 at present time.

## 2021-07-03 NOTE — Progress Notes (Addendum)
Patient belongings: [employer] Envelope with $100 cash Envelope with $22 cash Multiple wallets with multiple IDs and cards Flip phone Ziplock bag of keys Eyeglasses Clothing  *cash counted with Building surveyor*

## 2021-07-03 NOTE — Evaluation (Signed)
Speech Language Pathology Evaluation Patient Details Name: Gwendolyn [name] MRN: [id number] DOB: 1935-03-18 Today's Date: 07/03/2021 Time: 1720-1735 SLP Time Calculation (min) (ACUTE ONLY): 15 min  Problem List:  Patient Active Problem List   Diagnosis Date Noted   Intracerebral hemorrhage (HCC) 07/03/2021   Past Medical History:  Past Medical History:  Diagnosis Date   A-fib (HCC)    Atrial fibrillation (HCC)    Hypertension    Loss of consciousness (HCC)    Syncope    Past Surgical History:  Past Surgical History:  Procedure Laterality Date   BARTHOLIN GLAND CYST EXCISION     HPI:  Patient is an 86 y.o. female with PMH: a fib not on anticougulation, HTN, syncope. She lives in a hotel and presented to [facility] ED after being found by hotel staff with AMS. Per report, last known well was two days prior. She presented to ED with left side weakness, left side facial droop and slurred speech. CT head showed acute right basal ganglia hemorrhage. She failed the Yale swallow screen with nursing and has been NPO.   Assessment / Plan / Recommendation Clinical Impression  Patient presents with min-moderate flaccid dysarthria and moderately impaired cognitive functioning and pragmatic language impairment. Expressive and receptive language abilities appear WFL. Patient was oriented x4. Affect was flat and voice monotone with patient not attempting to visual attend to SLP even when he was on her right side which is where her head was turned. She was able to turn head more towards midline but not fully and did not exhibit any awareness to left visual field and did not demonstrate adequate awareness to her significant left-sided weakness. Speech intelligibility declined at sentence and conversational level but when topic was known, SLP able to understand her without significant difficulty. She attributed her inability to turn her neck to the left side to a fall she had on December 30th and she pointed to  a indentation scar on right forehead that was the result of this fall. Overall, patient exhibiting significant deficits in: speech intelligibility, awareness, attention, initiation, problem solving and will benefit from skilled SLP intervention to maximize her function in these areas.    SLP Assessment  SLP Recommendation/Assessment: Patient needs continued Speech Lanaguage Pathology Services SLP Visit Diagnosis: Cognitive communication deficit (R41.841);Dysarthria and anarthria (R47.1)    Recommendations for follow up therapy are one component of a multi-disciplinary discharge planning process, led by the attending physician.  Recommendations may be updated based on patient status, additional functional criteria and insurance authorization.    Follow Up Recommendations  Acute inpatient rehab (3hours/day)    Assistance Recommended at Discharge  Frequent or constant Supervision/Assistance  Functional Status Assessment Patient has had a recent decline in their functional status and demonstrates the ability to make significant improvements in function in a reasonable and predictable amount of time.  Frequency and Duration min 2x/week  1 week      SLP Evaluation Cognition  Overall Cognitive Status: Impaired/Different from baseline Arousal/Alertness: Awake/alert Orientation Level: Oriented X4 Year: 2023 Month: May Day of Week: Correct Attention: Sustained;Selective Sustained Attention: Appears intact Selective Attention: Impaired Selective Attention Impairment: Verbal complex;Functional basic Awareness: Impaired Awareness Impairment: Intellectual impairment Problem Solving: Impaired Problem Solving Impairment: Functional basic Executive Function: Self Monitoring;Initiating Initiating: Impaired Initiating Impairment: Verbal basic;Functional basic Self Monitoring: Impaired Self Monitoring Impairment: Verbal basic;Functional basic Safety/Judgment: Impaired       Comprehension   Auditory Comprehension Overall Auditory Comprehension: Appears within functional limits for tasks assessed  Expression Expression Primary Mode of Expression: Verbal Verbal Expression Overall Verbal Expression: Appears within functional limits for tasks assessed Pragmatics: Impairment Impairments: Eye contact;Abnormal affect;Monotone   Oral / Motor  Oral Motor/Sensory Function Overall Oral Motor/Sensory Function: Moderate impairment Facial ROM: Reduced left;Suspected CN VII (facial) dysfunction Facial Symmetry: Abnormal symmetry left;Suspected CN VII (facial) dysfunction Facial Strength: Reduced left;Suspected CN VII (facial) dysfunction Facial Sensation: Reduced left;Suspected CN V (Trigeminal) dysfunction Lingual Strength: Reduced Motor Speech Overall Motor Speech: Impaired Respiration: Within functional limits Phonation: Normal Resonance: Within functional limits Articulation: Impaired Level of Impairment: Phrase Intelligibility: Intelligibility reduced Word: 75-100% accurate Phrase: 50-74% accurate Sentence: 50-74% accurate Conversation: 50-74% accurate Motor Planning: Witnin functional limits Motor Speech Errors: Not applicable Effective Techniques: Slow rate            Angela Nevin, MA, CCC-SLP Speech Therapy

## 2021-07-03 NOTE — Progress Notes (Signed)
Patient ID: Gwendolyn [name], female   DOB: 12-Jan-1936, 86 y.o.   MRN: [id number]  "Gwendolyn [name] is a 86 y.o. female with past medical history of A. Fib not on [facility] (patient states she has not taken Eliquis for years, was stopped by MD), HTN, syncope, and lives at a hotel, presents to the [facility] ED after being found by hotel staff with AMS. Per report LSN 2 days ago, she presents with left side weakness, and left facial droop and slurred speech. She is in A fib with RVR in The ED currently on a cardizem gtt. CT Head with acute right BG hemorrhage. She denies recent headache, falls, or head trauma. She states she is having a hard time using her left side"  NIH on admission 1a Level of Conscious.: 0 1b LOC Questions: 0 1c LOC Commands: 0 2 Best Gaze: 2 3 Visual: 1 4 Facial Palsy: 2 5a Motor Arm - left: 2 5b Motor Arm - Right: 0 6a Motor Leg - Left: 3 6b Motor Leg - Right: 0 7 Limb Ataxia: 0 8 Sensory: 0 9 Best Language: 0 10 Dysarthria: 1 11 Extinct. and Inatten.: 1 TOTAL: 12   Similar exam now except drowsy   Monitor does not seem to be capturing heart rate accurately due to excessive noise in lead I   Plan:  - Adjust telemetry device to accurately capture HR  - Okay for MAP > 75 given exam stable at this BP  - Cardiology consult if HR / BP management remains challenging after adjusting monitor for accuracy   20 minutes of critical care, billing per Dr. Thomasena Edis earlier today

## 2021-07-04 ENCOUNTER — Inpatient Hospital Stay (HOSPITAL_COMMUNITY): Payer: Medicare Other

## 2021-07-04 DIAGNOSIS — R4182 Altered mental status, unspecified: Secondary | ICD-10-CM | POA: Diagnosis not present

## 2021-07-04 DIAGNOSIS — I6389 Other cerebral infarction: Secondary | ICD-10-CM

## 2021-07-04 DIAGNOSIS — I4891 Unspecified atrial fibrillation: Secondary | ICD-10-CM | POA: Diagnosis not present

## 2021-07-04 DIAGNOSIS — I619 Nontraumatic intracerebral hemorrhage, unspecified: Secondary | ICD-10-CM

## 2021-07-04 LAB — COMPREHENSIVE METABOLIC PANEL
ALT: 43 U/L (ref 0–44)
AST: 85 U/L — ABNORMAL HIGH (ref 15–41)
Albumin: 3.1 g/dL — ABNORMAL LOW (ref 3.5–5.0)
Alkaline Phosphatase: 73 U/L (ref 38–126)
Anion gap: 13 (ref 5–15)
BUN: 21 mg/dL (ref 8–23)
CO2: 18 mmol/L — ABNORMAL LOW (ref 22–32)
Calcium: 8.3 mg/dL — ABNORMAL LOW (ref 8.9–10.3)
Chloride: 102 mmol/L (ref 98–111)
Creatinine, Ser: 0.85 mg/dL (ref 0.44–1.00)
GFR, Estimated: >60 mL/min (ref >60)
Glucose, Bld: 113 mg/dL — ABNORMAL HIGH (ref 70–99)
Potassium: 3.9 mmol/L (ref 3.5–5.1)
Sodium: 133 mmol/L — ABNORMAL LOW (ref 135–145)
Total Bilirubin: 1.1 mg/dL (ref 0.3–1.2)
Total Protein: 6.2 g/dL — ABNORMAL LOW (ref 6.5–8.1)

## 2021-07-04 LAB — CBC
HCT: 39.2 % (ref 36.0–46.0)
Hemoglobin: 13.4 g/dL (ref 12.0–15.0)
MCH: 33.7 pg (ref 26.0–34.0)
MCHC: 34.2 g/dL (ref 30.0–36.0)
MCV: 98.5 fL (ref 80.0–100.0)
Platelets: 180 10*3/uL (ref 150–400)
RBC: 3.98 MIL/uL (ref 3.87–5.11)
RDW: 13.4 % (ref 11.5–15.5)
WBC: 14.1 10*3/uL — ABNORMAL HIGH (ref 4.0–10.5)
nRBC: 0 % (ref 0.0–0.2)

## 2021-07-04 LAB — LIPID PANEL
Cholesterol: 226 mg/dL — ABNORMAL HIGH (ref 0–200)
HDL: 88 mg/dL (ref >40)
LDL Cholesterol: 122 mg/dL — ABNORMAL HIGH (ref 0–99)
Total CHOL/HDL Ratio: 2.6 RATIO
Triglycerides: 80 mg/dL (ref <150)
VLDL: 16 mg/dL (ref 0–40)

## 2021-07-04 LAB — ECHOCARDIOGRAM COMPLETE
AR max vel: 1.78 cm2
AV Area VTI: 1.64 cm2
AV Area mean vel: 1.58 cm2
AV Mean grad: 2 mmHg
AV Peak grad: 3.8 mmHg
Ao pk vel: 0.98 m/s
Area-P 1/2: 4.21 cm2
Height: 61 in
S' Lateral: 2.7 cm
Single Plane A4C EF: 56.9 %
Weight: 1872 oz

## 2021-07-04 LAB — GLUCOSE, CAPILLARY
Glucose-Capillary: 142 mg/dL — ABNORMAL HIGH (ref 70–99)
Glucose-Capillary: 147 mg/dL — ABNORMAL HIGH (ref 70–99)

## 2021-07-04 LAB — HEMOGLOBIN A1C
Hgb A1c MFr Bld: 5.7 % — ABNORMAL HIGH (ref 4.8–5.6)
Mean Plasma Glucose: 116.89 mg/dL

## 2021-07-04 LAB — PHOSPHORUS: Phosphorus: 3.4 mg/dL (ref 2.5–4.6)

## 2021-07-04 LAB — CK: Total CK: 4387 U/L — ABNORMAL HIGH (ref 38–234)

## 2021-07-04 LAB — MAGNESIUM: Magnesium: 1.8 mg/dL (ref 1.7–2.4)

## 2021-07-04 MED ORDER — PROSOURCE TF PO LIQD
45.0000 mL | Freq: Two times a day (BID) | ORAL | Status: DC
  Administered 2021-07-04 – 2021-07-11 (×13): 45 mL
  Filled 2021-07-04 (×13): qty 45

## 2021-07-04 MED ORDER — ADULT MULTIVITAMIN W/MINERALS CH
1.0000 | ORAL_TABLET | Freq: Every day | ORAL | Status: DC
  Administered 2021-07-04 – 2021-07-11 (×7): 1
  Filled 2021-07-04 (×7): qty 1

## 2021-07-04 MED ORDER — POLYETHYLENE GLYCOL 3350 17 G PO PACK
17.0000 g | PACK | Freq: Every day | ORAL | Status: DC
  Administered 2021-07-04 – 2021-07-11 (×6): 17 g
  Filled 2021-07-04 (×6): qty 1

## 2021-07-04 MED ORDER — DILTIAZEM HCL 60 MG PO TABS
60.0000 mg | ORAL_TABLET | Freq: Four times a day (QID) | ORAL | Status: DC
  Administered 2021-07-04 – 2021-07-05 (×4): 60 mg
  Filled 2021-07-04 (×4): qty 1

## 2021-07-04 MED ORDER — AMIODARONE HCL IN DEXTROSE 360-4.14 MG/200ML-% IV SOLN
30.0000 mg/h | INTRAVENOUS | Status: DC
  Administered 2021-07-04 – 2021-07-06 (×4): 30 mg/h via INTRAVENOUS
  Filled 2021-07-04 (×3): qty 200

## 2021-07-04 MED ORDER — OSMOLITE 1.5 CAL PO LIQD
1000.0000 mL | ORAL | Status: DC
  Administered 2021-07-04 – 2021-07-06 (×2): 1000 mL
  Filled 2021-07-04 (×3): qty 1000

## 2021-07-04 MED ORDER — AMIODARONE LOAD VIA INFUSION
150.0000 mg | Freq: Once | INTRAVENOUS | Status: AC
  Administered 2021-07-04: 150 mg via INTRAVENOUS
  Filled 2021-07-04: qty 83.34

## 2021-07-04 MED ORDER — ATORVASTATIN CALCIUM 40 MG PO TABS
40.0000 mg | ORAL_TABLET | Freq: Every day | ORAL | Status: DC
  Administered 2021-07-05 – 2021-07-11 (×6): 40 mg
  Filled 2021-07-04 (×6): qty 1

## 2021-07-04 MED ORDER — AMIODARONE HCL IN DEXTROSE 360-4.14 MG/200ML-% IV SOLN
60.0000 mg/h | INTRAVENOUS | Status: AC
Start: 2021-07-04 — End: 2021-07-04
  Administered 2021-07-04 (×2): 60 mg/h via INTRAVENOUS
  Filled 2021-07-04 (×2): qty 200

## 2021-07-04 MED ORDER — ATORVASTATIN CALCIUM 40 MG PO TABS: 40.0000 mg | ORAL_TABLET | Freq: Every day | ORAL | Status: DC

## 2021-07-04 MED ORDER — SENNOSIDES 8.8 MG/5ML PO SYRP
10.0000 mL | ORAL_SOLUTION | Freq: Two times a day (BID) | ORAL | Status: DC
  Administered 2021-07-04 – 2021-07-05 (×2): 10 mL
  Filled 2021-07-04 (×4): qty 10

## 2021-07-04 NOTE — Progress Notes (Signed)
OT Cancellation Note  Patient Details Name: Gwendolyn [name] MRN: [id number] DOB: Jun 28, 1935   Cancelled Treatment:    Reason Eval/Treat Not Completed: Active bedrest order.  [place], OTR/L Acute Rehab Services Pager [phone] Office [phone]    [name] 07/04/2021, 8:25 AM

## 2021-07-04 NOTE — Consult Note (Addendum)
WOC Nurse Consult Note: Reason for Consult: Consult requested for sacrum Wound type: Dark red-purple deep tissue pressure injury, (DTPI) noted as present on admission in wound flowsheet. DTPI are high risk to evolve into full thickness tissue loss within 7-10 days. Pt is critically ill with multiple systemic factors which can impair healing.  Pressure Injury POA: Yes Dressing procedure/placement/frequency:  Pt is on a low airloss mattress to reduce pressure. Foam dressing to protect from further injury.  Please re consult for further recommendations if the wound declines. Cammie Mcgee MSN, RN, CWOCN, [place], CNS [phone]

## 2021-07-04 NOTE — Progress Notes (Addendum)
STROKE TEAM PROGRESS NOTE   INTERVAL HISTORY Patient is seen in her room with no family at the bedside.  Yesterday, she was found with altered mental status, left sided facial droop, slurred speech and left sided weakness.  She was found to have an acute stroke in the right basal ganglia with hemorrhagic transformation.  She was noted to be in a-fib with RVR and was initially treated with a diltiazem gtt then switched to amiodarone due to hypotension.  Vitals:   07/04/21 0900 07/04/21 1000 07/04/21 1100 07/04/21 1200  BP: (!) 102/59 110/82 123/75 126/78  Pulse: (!) 117 (!) 115 (!) 109 (!) 116  Resp: 12 15 (!) 34 19  Temp:    97.8 F (36.6 C)  TempSrc:    Axillary  SpO2: 99% 100% 99% 97%  Weight:      Height:       CBC:  Recent Labs  Lab 07/03/21 1147 07/04/21 0040  WBC 16.7* 14.1*  NEUTROABS 14.5*  --   HGB 13.9 13.4  HCT 41.9 39.2  MCV 98.8 98.5  PLT 236 [id number]   Basic Metabolic Panel:  Recent Labs  Lab 07/03/21 1147 07/04/21 0040  NA 132* 133*  K 4.4 3.9  CL 97* 102  CO2 19* 18*  GLUCOSE 126* 113*  BUN 20 21  CREATININE 1.03* 0.85  CALCIUM 8.8* 8.3*  MG  --  1.8  PHOS  --  3.4   Lipid Panel:  Recent Labs  Lab 07/04/21 0040  CHOL 226*  TRIG 80  HDL 88  CHOLHDL 2.6  VLDL 16  LDLCALC 122*   HgbA1c:  Recent Labs  Lab 07/04/21 0040  HGBA1C 5.7*   Urine Drug Screen:  Recent Labs  Lab 07/03/21 1626  LABOPIA NONE DETECTED  COCAINSCRNUR NONE DETECTED  LABBENZ NONE DETECTED  AMPHETMU NONE DETECTED  THCU NONE DETECTED  LABBARB NONE DETECTED    Alcohol Level No results for input(s): ETH in the last 168 hours.  IMAGING past 24 hours CT ANGIO HEAD NECK W WO CM  Result Date: 07/03/2021 CLINICAL DATA:  Transient ischemic attack (TIA); left facial droop; intracranial hemorrhage on CT head EXAM: CT ANGIOGRAPHY HEAD AND NECK TECHNIQUE: Multidetector CT imaging of the head and neck was performed using the standard protocol during bolus administration of  intravenous contrast. Multiplanar CT image reconstructions and MIPs were obtained to evaluate the vascular anatomy. Carotid stenosis measurements (when applicable) are obtained utilizing NASCET criteria, using the distal internal carotid diameter as the denominator. RADIATION DOSE REDUCTION: This exam was performed according to the departmental dose-optimization program which includes automated exposure control, adjustment of the mA and/or kV according to patient size and/or use of iterative reconstruction technique. CONTRAST:  66mL OMNIPAQUE IOHEXOL 350 MG/ML SOLN COMPARISON:  None Available. FINDINGS: CTA NECK Aortic arch: Mild calcified plaque. Great vessel origins are patent. Right carotid system: Patent. Mild calcified plaque at the bifurcation and proximal internal carotid. No stenosis. Irregularity of the mid to distal cervical ICA with an undulating appearance. Left carotid system: Patent. Trace calcified plaque at the bifurcation. No stenosis. Irregularity of the mid to distal cervical ICA to lesser extent than on the right. Vertebral arteries: Patent and codominant. Trace calcified plaque at the right vertebral origin. Skeleton: Cervical spine degenerative changes. Other neck: Unremarkable. Upper chest: Partially imaged small right pleural effusion with adjacent atelectasis. Review of the MIP images confirms the above findings CTA HEAD Anterior circulation: Intracranial internal carotid arteries are patent with calcified plaque causing mild  stenosis. Small inferiorly directed infundibulum distal supraclinoid of the right ICA. A 1 mm inferiorly directed outpouching on the left is also noted without definite associated vessel origin. Anterior and middle cerebral arteries are patent. There is marked stenosis of the distal right M1 MCA as it bifurcates. There is moderate to marked stenosis of the proximal anterior and posterior divisions. Diminished flow is seen within distal vessels of the right MCA  territory. There is occlusion of a distal right M2 branch (series 10, image 15). Posterior circulation: Intracranial vertebral arteries are patent. Basilar artery is patent. Major cerebellar artery origins are patent. Posterior cerebral arteries are patent. Venous sinuses: Not well evaluated on this study. Review of the MIP images confirms the above findings IMPRESSION: Marked stenosis of the distal right M1 MCA as it bifurcates with moderate to marked stenosis of the proximal anterior and posterior divisions. There is occlusion of a distal right M2 MCA branch. No abnormal vascularity in area of hemorrhage on the prior noncontrast head CT. Given the vascular findings, this may reflect hemorrhagic transformation of an ischemic infarct. No occlusion or hemodynamically significant stenosis in the neck. There is irregularity of the right greater than left cervical internal carotid arteries suggesting an underlying vasculopathy such as fibromuscular dysplasia. 1 mm inferiorly directed outpouching from the distal supraclinoid left ICA may reflect an infundibulum or aneurysm. Partially imaged small right pleural effusion with adjacent atelectasis. Electronically Signed   By: Guadlupe Spanish M.D.   On: 07/03/2021 14:46   CT HEAD WO CONTRAST  Result Date: 07/03/2021 CLINICAL DATA:  Intracranial hemorrhage, follow-up EXAM: CT HEAD WITHOUT CONTRAST TECHNIQUE: Contiguous axial images were obtained from the base of the skull through the vertex without intravenous contrast. RADIATION DOSE REDUCTION: This exam was performed according to the departmental dose-optimization program which includes automated exposure control, adjustment of the mA and/or kV according to patient size and/or use of iterative reconstruction technique. COMPARISON:  07/03/2021 12:01 p.m. FINDINGS: Brain: Redemonstrated acute hemorrhages in the right basal ganglia, with a right putaminal hemorrhage that measures approximately 2.6 x 0.9 x 1.8 cm (AP x TR x  CC) (series 3, image 16 and series 5, image 33), previously 2.4 x 0.8 x 1.6 cm when remeasured similarly, and a smaller hemorrhage in the right caudate that measures approximately 1.6 x 1.1 x 0.9 cm (AP x TR x CC) (series 3, image 20 and series 5, image 33), previously 1.6 x 1.0 x 0.5 cm when remeasured similarly. Overall unchanged degree of edema, without significant mass effect. No evidence of large vascular territory infarct. No hydrocephalus or extra-axial collection. Periventricular white matter changes, likely the sequela of chronic small vessel ischemic disease. Vascular: No hyperdense vessel. Skull: No acute osseous abnormality. Sinuses/Orbits: No acute finding. Status post bilateral lens replacements. Other: The mastoids are well aerated. IMPRESSION: 1. Redemonstrated acute intraparenchymal hemorrhages in the right basal ganglia, which have both slightly increased in size compared to the prior exam, with overall unchanged edema and without significant mass effect. 2.   No additional acute intracranial process. These results will be called to the ordering clinician or representative by the Radiologist Assistant, and communication documented in the PACS or [employer]. Electronically Signed   By: Wiliam Ke M.D.   On: 07/03/2021 22:29   ECHOCARDIOGRAM COMPLETE  Result Date: 07/04/2021    ECHOCARDIOGRAM REPORT   Patient Name:   HARLYM Bellucci Date of Exam: 07/04/2021 Medical Rec #:  [id number]        Height:  61.0 in Accession #:    [id number]       Weight:       117.0 lb Date of Birth:  1935/03/15       BSA:          1.504 m Patient Age:    85 years         BP:           86/68 mmHg Patient Gender: F                HR:           109 bpm. Exam Location:  Inpatient Procedure: 2D Echo, Cardiac Doppler and Color Doppler                                MODIFIED REPORT:  This report was modified by Weston Brass MD on 07/04/2021 due to conclusion                                     added.   Indications:     Stroke  History:         Patient has prior history of Echocardiogram examinations, most                  recent 04/01/2017. Arrythmias:Atrial Fibrillation,                  Signs/Symptoms:Syncope; Risk Factors:Hypertension.  Sonographer:     Rodrigo Ran RCS Referring Phys:  [phone] Angelique Blonder A WOLFE Diagnosing Phys: Weston Brass MD IMPRESSIONS  1. Left ventricular ejection fraction, by estimation, is 55% with beat to beat variability. The left ventricle has normal function. The left ventricle has no regional wall motion abnormalities. Left ventricular diastolic parameters are indeterminate.  2. Right ventricular systolic function is mildly reduced. The right ventricular size is normal. There is normal pulmonary artery systolic pressure. The estimated right ventricular systolic pressure is 23.8 mmHg.  3. Left atrial size was moderately dilated.  4. Right atrial size was moderately dilated.  5. The mitral valve is degenerative. Trivial mitral valve regurgitation. No evidence of mitral stenosis. Severe mitral annular calcification.  6. The aortic valve is grossly normal. There is mild calcification of the aortic valve. There is mild thickening of the aortic valve. Aortic valve regurgitation is not visualized. Aortic valve sclerosis is present, with no evidence of aortic valve stenosis.  7. The inferior vena cava is normal in size with <50% respiratory variability, suggesting right atrial pressure of 8 mmHg. Conclusion(s)/Recommendation(s): No intracardiac source of embolism detected on this transthoracic study. Consider a transesophageal echocardiogram to exclude cardiac source of embolism if clinically indicated. FINDINGS  Left Ventricle: Anomalous chord inserting into basal septum. Left ventricular ejection fraction, by estimation, is 55%. The left ventricle has normal function. The left ventricle has no regional wall motion abnormalities. The left ventricular internal cavity size was normal in  size. There is no left ventricular hypertrophy. Left ventricular diastolic parameters are indeterminate. Right Ventricle: The right ventricular size is normal. No increase in right ventricular wall thickness. Right ventricular systolic function is mildly reduced. There is normal pulmonary artery systolic pressure. The tricuspid regurgitant velocity is 1.99 m/s, and with an assumed right atrial pressure of 8 mmHg, the estimated right ventricular systolic pressure is 23.8 mmHg. Left Atrium: Left atrial size was moderately dilated. Right  Atrium: Right atrial size was moderately dilated. Pericardium: Trivial pericardial effusion is present. Mitral Valve: The mitral valve is degenerative in appearance. Severe mitral annular calcification. Trivial mitral valve regurgitation. No evidence of mitral valve stenosis. The mean mitral valve gradient is 1.2 mmHg with average heart rate of 136 bpm. Tricuspid Valve: The tricuspid valve is normal in structure. Tricuspid valve regurgitation is mild . No evidence of tricuspid stenosis. Aortic Valve: The aortic valve is grossly normal. There is mild calcification of the aortic valve. There is mild thickening of the aortic valve. Aortic valve regurgitation is not visualized. Aortic valve sclerosis is present, with no evidence of aortic valve stenosis. Aortic valve mean gradient measures 2.0 mmHg. Aortic valve peak gradient measures 3.8 mmHg. Aortic valve area, by VTI measures 1.64 cm. Pulmonic Valve: The pulmonic valve was normal in structure. Pulmonic valve regurgitation is trivial. No evidence of pulmonic stenosis. Aorta: The aortic root is normal in size and structure. Venous: The inferior vena cava is normal in size with less than 50% respiratory variability, suggesting right atrial pressure of 8 mmHg. IAS/Shunts: No atrial level shunt detected by color flow Doppler.  LEFT VENTRICLE PLAX 2D LVIDd:         4.30 cm     Diastology LVIDs:         2.70 cm     LV e' lateral:   7.04 cm/s  LV PW:         0.90 cm     LV E/e' lateral: 15.1 LV IVS:        1.00 cm LVOT diam:     1.70 cm LV SV:         29 LV SV Index:   19 LVOT Area:     2.27 cm  LV Volumes (MOD) LV vol d, MOD A2C: 43.8 ml LV vol d, MOD A4C: 39.7 ml LV vol s, MOD A4C: 17.1 ml LV SV MOD A4C:     39.7 ml RIGHT VENTRICLE             IVC RV Basal diam:  3.30 cm     IVC diam: 1.30 cm RV Mid diam:    2.70 cm RV S prime:     10.60 cm/s TAPSE (M-mode): 1.8 cm LEFT ATRIUM             Index        RIGHT ATRIUM           Index LA diam:        3.20 cm 2.13 cm/m   RA Area:     11.00 cm LA Vol (A2C):   24.2 ml 16.09 ml/m  RA Volume:   20.00 ml  13.30 ml/m LA Vol (A4C):   28.7 ml 19.08 ml/m LA Biplane Vol: 27.2 ml 18.09 ml/m  AORTIC VALVE                    PULMONIC VALVE AV Area (Vmax):    1.78 cm     PV Vmax:          1.16 m/s AV Area (Vmean):   1.58 cm     PV Peak grad:     5.4 mmHg AV Area (VTI):     1.64 cm     PR End Diast Vel: 3.64 msec AV Vmax:           98.00 cm/s AV Vmean:          75.200 cm/s AV VTI:  0.179 m AV Peak Grad:      3.8 mmHg AV Mean Grad:      2.0 mmHg LVOT Vmax:         76.70 cm/s LVOT Vmean:        52.500 cm/s LVOT VTI:          0.129 m LVOT/AV VTI ratio: 0.72  AORTA Ao Root diam: 2.60 cm Ao Asc diam:  3.00 cm MITRAL VALVE                TRICUSPID VALVE MV Area (PHT): 4.21 cm     TR Peak grad:   15.8 mmHg MV Mean grad:  1.2 mmHg     TR Vmax:        199.00 cm/s MV Decel Time: 180 msec MV E velocity: 106.00 cm/s  SHUNTS                             Systemic VTI:  0.13 m                             Systemic Diam: 1.70 cm Cherlynn Kaiser MD Electronically signed by Cherlynn Kaiser MD Signature Date/Time: 07/04/2021/12:26:52 PM    Final (Updated)     PHYSICAL EXAM General:  Alert, well-developed, well-nourished elderly Caucasian lady in no acute distress Respiratory:  Regular, unlabored respirations on room air  NEURO:  Mental Status: AA&Ox3 .  Head and gaze deviation to the right Speech/Language: speech is  without dysarthria or aphasia.  Naming, repetition, fluency, and comprehension intact.  Cranial Nerves:  II: PERRL. Visual fields showed decreased blink to threat on the left III, IV, VI: right gaze deviation unable to look to the left past midline V: Sensation is intact to light touch and symmetrical to face with extinction to DSS on left side VII: left sided facial droop  VIII: hearing intact to voice. IX, X: Phonation is normal.  XII: tongue is midline without fasciculations. Motor: 5/5 strength to RUE and RLE, 3/5 to LUE, 2/5 to LLE Sensation- Intact to light touch bilaterally. Extinction present to DSS on the left  Coordination: FTN intact on right Gait- deferred   ASSESSMENT/PLAN Ms. Gwendolyn [name] is a 86 y.o. female with history of atrial fibrillation not on anticoagulation, HTN and syncope presenting with altered mental status, left sided facial droop, slurred speech and left sided weakness.  She was found to have an acute stroke in the right basal ganglia with hemorrhagic transformation.  She was noted to be in a-fib with RVR and was initially treated with a diltiazem gtt then switched to amiodarone due to hypotension.  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 likely secondary due to embolism in setting of atrial fibrillation not on anticoagulation CT head multiple acute IPHs in right basal ganglia CTA head & neck distal right M1 MCA stenosis and occlusion of distal right M2 MCA MRI  pending 2D Echo EF 55%, moderately dilated left atrium, no atrial level shunt LDL 122 HgbA1c 5.7 VTE prophylaxis - SCDs    Diet   Diet NPO time specified   aspirin 81 mg daily prior to admission, now on No antithrombotic due to HT Therapy recommendations:  pending Disposition:  pending  Atrial fibrillation Patient is in a-fib with rates to the 160s Not on anticoagulation at home Was initially treated with diltiazem, now on amiodarone due to  hypotension  Hypertension Home meds:  carvedilol 6.25 mg BID, lisinopril 40 mg daily Stable Permissive hypertension (OK if < 220/120) but gradually normalize in 5-7 days Long-term BP goal normotensive  Hyperlipidemia Home meds:  none LDL 122, goal < 70 Add atorvastatin 40 mg daily  Continue statin at discharge   Other Stroke Risk Factors Advanced Age >/= 90   Other Active Problems none  Hospital day # [place] , MSN, AGACNP-BC [facility] See Amion for schedule and pager information 07/04/2021 2:05 PM    STROKE MD NOTE :  I have personally obtained history,examined this patient, reviewed notes, independently viewed imaging studies, participated in medical decision making and plan of care.ROS completed by me personally and pertinent positives fully documented  I have made any additions or clarifications directly to the above note. Agree with note above.  Patient presented with altered mental status and left hemiplegia due to large right basal ganglia infarct with hemorrhagic transformation in the setting of atrial fibrillation with rapid ventricular rate.Neurological exam shows right gaze deviation left hemiparesis left visual field cut recommend close neurological monitoring and strict blood pressure control with systolic goal below 0000000 and amiodarone for rate control.  Hold aspirin and anticoagulation for now due to hemorrhage and consider resuming in 5 to 7 days.  Check MRI scan of the brain later.  Critical care consult manage A-fib and rapid heart rate.  Mobilize out of bed.  Therapy consults.  Speech therapy for swallow eval.  No family by the bedside.  Discussed with Dr. Tacy Learn critical care medicine This patient is critically ill and at significant risk of neurological worsening, death and care requires constant monitoring of vital signs, hemodynamics,respiratory and cardiac monitoring, extensive review of multiple databases, frequent neurological  assessment, discussion with family, other specialists and medical decision making of high complexity.I have made any additions or clarifications directly to the above note.This critical care time does not reflect procedure time, or teaching time or supervisory time of PA/NP/Med Resident etc but could involve care discussion time.  I spent 35 minutes of neurocritical care time  in the care of  this patient.     Antony Contras, MD Medical Director [facility] Pager: [phone] 07/04/2021 2:59 PM   To contact Stroke Continuity provider, please refer to [email or web address] After hours, contact General Neurology

## 2021-07-04 NOTE — Consult Note (Signed)
NAME:  Gwendolyn [name], MRN:  [id number], DOB:  27-Jan-1936, LOS: 1 ADMISSION DATE:  07/03/2021, CONSULTATION DATE: 07/04/2021 REFERRING MD: Dr. 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, patient was admitted under stroke service, since admission patient was noted to be in A-fib with RVR heart rate ranging in 160s, she was started on IV diltiazem infusion without much improvement and episodes of hypotension, PCCM was consulted for help with evaluation and medical management  Pertinent  Medical History   Past Medical History:  Diagnosis Date   A-fib [facility])    Atrial fibrillation ([place])    Hypertension    Loss of consciousness ([employer])    Syncope      Significant Hospital Events: Including procedures, antibiotic start and stop dates in addition to other pertinent events     Interim History / Subjective:  Complained of palpitation, denies chest pain  Objective   Blood pressure 105/63, pulse (!) 146, temperature 97.8 F (36.6 C), temperature source Axillary, resp. rate (!) 25, height 5\' 1"  (1.549 m), weight 53.1 kg, SpO2 (!) 89 %.        Intake/Output Summary (Last 24 hours) at 07/04/2021 1420 Last data filed at 07/04/2021 1300 Gross per 24 hour  Intake 635.69 ml  Output 201 ml  Net 434.69 ml   Filed Weights   07/03/21 1139  Weight: 53.1 kg    Examination: Physical exam: General: Acute on chronically ill-appearing female, lying on the bed HEENT: [place]/AT, eyes anicteric.  moist mucus membranes Neuro: Alert, awake following commands.  Left facial droop, left-side is weaker than right but antigravity in all 4 extremities.  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  Diltiazem infusion was increased We will stop diltiazem, will give bolus of amiodarone followed by amiodarone infusion Patient is not on anticoagulation at home, now she has hemorrhagic conversion of her right basal ganglia stroke, will hold off on anticoagulation despite CHA2DS2-VASc score is 6 Hold antiplatelet agent Continue telemetry monitoring Echocardiogram is pending Hold antihypertensive meds Continue statin  PCCM will continue to follow along   Best Practice (right click and "Reselect all SmartList Selections" daily)   Diet/type: NPO swallow evaluation DVT prophylaxis: SCD GI prophylaxis: PPI Lines: N/A Foley:  N/A Code Status:  full code Last date of multidisciplinary goals of care discussion [Per primary team]  Labs   CBC: Recent Labs  Lab 07/03/21 1147 07/04/21 0040  WBC 16.7* 14.1*  NEUTROABS 14.5*  --   HGB 13.9 13.4  HCT 41.9 39.2  MCV 98.8 98.5  PLT 236 [id number]    Basic Metabolic Panel: Recent Labs  Lab 07/03/21 1147 07/04/21 0040  NA 132* 133*  K 4.4 3.9  CL 97* 102  CO2 19* 18*  GLUCOSE 126* 113*  BUN 20 21  CREATININE 1.03*  0.85  CALCIUM 8.8* 8.3*  MG  --  1.8  PHOS  --  3.4   GFR: Estimated Creatinine Clearance: 37.8 mL/min (by C-G formula based on SCr of 0.85 mg/dL). Recent Labs  Lab 07/03/21 1147 07/03/21 1320 07/04/21 0040  WBC 16.7*  --  14.1*  LATICACIDVEN  --  1.4  --     Liver Function Tests: Recent Labs  Lab  07/03/21 1147 07/04/21 0040  AST 86* 85*  ALT 41 43  ALKPHOS 80 73  BILITOT 1.7* 1.1  PROT 7.0 6.2*  ALBUMIN 3.6 3.1*   No results for input(s): LIPASE, AMYLASE in the last 168 hours. Recent Labs  Lab 07/03/21 1148  AMMONIA 21    ABG    Component Value Date/Time   HCO3 25.8 10/26/2019 1147   TCO2 24 10/26/2019 1147   TCO2 27 10/26/2019 1147   O2SAT 75.0 10/26/2019 1147     Coagulation Profile: Recent Labs  Lab 07/03/21 1147  INR 1.2    Cardiac Enzymes: Recent Labs  Lab 07/03/21 1147 07/04/21 0040  CKTOTAL 3,851* 4,387*    HbA1C: Hgb A1c MFr Bld  Date/Time Value Ref Range Status  07/04/2021 12:40 AM 5.7 (H) 4.8 - 5.6 % Final    Comment:    (NOTE) Pre diabetes:          5.7%-6.4%  Diabetes:              >6.4%  Glycemic control for   <7.0% adults with diabetes     CBG: Recent Labs  Lab 07/03/21 1206  GLUCAP 127*    Review of Systems:   12 point review of systems significant for complaint mentioned HPI, rest is negative  Past Medical History:  She,  has a past medical history of A-fib ([place]), Atrial fibrillation ([place]), Hypertension, Loss of consciousness ([place]), and Syncope.   Surgical History:   Past Surgical History:  Procedure Laterality Date   BARTHOLIN GLAND CYST EXCISION       Social History:   reports that she has never smoked. She has never used smokeless tobacco. She reports current alcohol use. She reports that she does not use drugs.   Family History:  Her family history includes Alzheimer's disease in her sister; Arthritis in her sister; Cirrhosis in her brother; Diabetes in her sister; Lung cancer in her father; Parkinson's disease in her mother.   Allergies No Known Allergies   Home Medications  Prior to Admission medications   Medication Sig Start Date End Date Taking? Authorizing Provider  amLODipine (NORVASC) 5 MG tablet Take 5 mg by mouth daily.   Yes [provider]  aspirin EC 81 MG tablet Take 81 mg by  mouth daily. Swallow whole.   Yes [provider]  carvedilol (COREG) 6.25 MG tablet Take 6.25 mg by mouth 2 (two) times daily with a meal.   Yes [provider]  Cholecalciferol (VITAMIN D3) 50000 units CAPS Take 50,000 Units by mouth every Sunday.   Yes [provider]  lisinopril (PRINIVIL,ZESTRIL) 40 MG tablet Take 40 mg by mouth daily.   Yes [provider]     Critical care time:     Total critical care time: 35 minutes  Performed by: [place] care time was exclusive of separately billable procedures and treating other patients.   Critical care was necessary to treat or prevent imminent or life-threatening deterioration.   Critical care was time spent personally by me on the following activities:  development of treatment plan with patient and/or surrogate as well as nursing, discussions with consultants, evaluation of patient's response to treatment, examination of patient, obtaining history from patient or surrogate, ordering and performing treatments and interventions, ordering and review of laboratory studies, ordering and review of radiographic studies, pulse oximetry and re-evaluation of patient's condition.   Jacky Kindle MD [facility] See Amion for pager If no response to pager, please call [phone] until 7pm After 7pm, Please call E-link [phone]

## 2021-07-04 NOTE — Progress Notes (Signed)
Initial Nutrition Assessment  DOCUMENTATION CODES:   Not applicable  INTERVENTION:   Initiate tube feeding via cortrak tube: Osmolite 1.5 at 20 ml/h and increase by 10 ml every 8 hours to goal rate of 40 ml/hr (960 ml per day) Prosource TF 45 ml BID   Provides 1520 kcal, 82 gm protein, 729 ml free water daily  MVI with minerals daily  Monitor magnesium and phosphorus every 12 hours x 4 occurrences, MD to replete as needed, as pt is at risk for refeeding syndrome.    NUTRITION DIAGNOSIS:   Inadequate oral intake related to inability to eat as evidenced by NPO status.  GOAL:   Patient will meet greater than or equal to 90% of their needs  MONITOR:   TF tolerance, Diet advancement  REASON FOR ASSESSMENT:   Consult Enteral/tube feeding initiation and management  ASSESSMENT:   Pt with PMH of chronic Afib and HTN admitted with acute R basal ganglia stroke with hemorrhagic conversion.   Pt discussed during ICU rounds and with RN and MD. Pt failed MBS.  Per abd xray pt with moderate volume of formed stool, pt has senokot-s ordered, reached out to MD.   Per pt she has lived in a hotel for the last year. She has a microwave and a refrigerator. She likes to eat greek yogurt with granola/fruit at breakfast. States she eats whatever she has. She is able to get to grocery store and has money for food. She also likes to drink premier protein. Ambulates ok, has a cane but does not use it. States she was 117 lb at her last doctor visit and does not feel she has lost any weight.   5/26 s/p cortrak placement; tip in mid body of stomach    Medications reviewed and include: protonix, senokot-s  NS @ 75 ml/hr  Labs reviewed: A1C: 5.7 CBG's: 127  NUTRITION - FOCUSED PHYSICAL EXAM:  Flowsheet Row Most Recent Value  Orbital Region No depletion  Upper Arm Region No depletion  Thoracic and Lumbar Region Mild depletion  Buccal Region No depletion  Temple Region No depletion  Clavicle  Bone Region No depletion  Clavicle and Acromion Bone Region Mild depletion  Scapular Bone Region Unable to assess  Dorsal Hand Mild depletion  Patellar Region Mild depletion  Anterior Thigh Region Mild depletion  Posterior Calf Region Mild depletion  Edema (RD Assessment) Mild       Diet Order:   Diet Order             Diet NPO time specified  Diet effective now                   EDUCATION NEEDS:   No education needs have been identified at this time  Skin:  Skin Assessment: Skin Integrity Issues: Skin Integrity Issues:: DTI DTI: sacrum  Last BM:  unknown  Height:   Ht Readings from Last 1 Encounters:  07/03/21 5\' 1"  (1.549 m)    Weight:   Wt Readings from Last 1 Encounters:  07/03/21 53.1 kg    BMI:  Body mass index is 22.11 kg/m.  Estimated Nutritional Needs:   Kcal:  1400-1600  Protein:  70-85 grams  Fluid:  > 1.5 L/day  Lockie Pares., RD, LDN, CNSC See AMiON for contact information

## 2021-07-04 NOTE — Progress Notes (Signed)
Echocardiogram 2D Echocardiogram has been performed.  Rodrigo Ran 07/04/2021, 9:16 AM

## 2021-07-04 NOTE — TOC CAGE-AID Note (Signed)
Transition of Care [facility]) - CAGE-AID Screening   Patient Details  Name: Rhona Fusilier MRN: [id number] Date of Birth: 1935/09/09  [facility] of [facility]) CM/SW Contact:    Maliaka Brasington C Tarpley-Carter, LCSWA Phone Number: 07/04/2021, 3:29 PM   Clinical Narrative: Pt participated in Cage-Aid.  Pt stated she does not use substance or ETOH.  Pt was not offered resources, due to no usage of substance or ETOH.     Callin Ashe Tarpley-Carter, MSW, LCSW-A Pronouns:  She/Her/Hers [facility] HealthTransitions of Care Clinical Social Worker Direct Number:  [phone] [email or web address]  CAGE-AID Screening:    Have You Ever Felt You Ought to Cut Down on Your Drinking or Drug Use?: No Have People Annoyed You By [employer] Your Drinking Or Drug Use?: No Have You Felt Bad Or Guilty About Your Drinking Or Drug Use?: No Have You Ever Had a Drink or Used Drugs First Thing In The Morning to Steady Your Nerves or to Get Rid of a Hangover?: No CAGE-AID Score: 0  Substance Abuse Education Offered: No

## 2021-07-04 NOTE — Progress Notes (Signed)
PT Cancellation Note  Patient Details Name: Charliene Thorpe MRN: [id number] DOB: 1936/01/14   Cancelled Treatment:    Reason Eval/Treat Not Completed: Medical issues which prohibited therapy. Pt in afib with HR in 140s when sleeping. PT will follow up when pt's HR is better controlled.   Zenaida Niece 07/04/2021, 4:21 PM

## 2021-07-04 NOTE — Progress Notes (Signed)
Modified Barium Swallow Progress Note  Patient Details  Name: Gwendolyn [name] MRN: [id number] Date of Birth: 05-Feb-1936  Today's Date: 07/04/2021  Modified Barium Swallow completed.  Full report located under Chart Review in the Imaging Section.  Brief recommendations include the following:  Clinical Impression  Pt demonstrates moderate oropharyngeal dysphagia with significantly decreased base of tongue retraction and upper pharyngeal contriction leading to moderate to severe residue on the base of tongue and valleculae as well as instances of mild nasopharyngeal regurgitation. Pt attempts to clear with several swallows, but with nectar there are instances of silent aspiration and penetration after the initial swallow and during successive swallow attempts. Pt is able to manage a few boluses of puree and honey thick liquids with extra time and effort, but will need further therapeutic trials before a diet can be considered given the severity of residue and effort involved. Pts head is turned to the right and seems to improve when head is coaxed to midline. Exam was brief due to high heart rate and Afib. Pt recommended to have Cortrak placed with SLP f/u for PO trials.   Swallow Evaluation Recommendations       SLP Diet Recommendations: NPO;Alternative means - temporary       Medication Administration: Crushed with puree                       Harlon Ditty, MA CCC-SLP  Acute Rehabilitation Services Secure Chat Preferred Office [phone]   Claudine Mouton 07/04/2021,3:02 PM

## 2021-07-04 NOTE — Procedures (Signed)
Cortrak  Person Inserting Tube:  Osa Craver, RD Tube Type:  Cortrak - 43 inches Tube Size:  10 Tube Location:  Left nare Secured by: Bridle Technique Used to Measure Tube Placement:  Marking at nare/corner of mouth Cortrak Secured At:  54 cm Procedure Comments:  Romelle Starcher MS, RDN, LDN, CNSC Registered Dietitian III Clinical Nutrition RD Pager and On-Call Pager Number Located in Amion    Cortrak Tube Team Note:  Consult received to place a Cortrak feeding tube.   X-ray is required, abdominal x-ray has been ordered by the Cortrak team. Please confirm tube placement before using the Cortrak tube.   If the tube becomes dislodged please keep the tube and contact the Cortrak team at [email or web address] (password [email or web address]) for replacement.  If after hours and replacement cannot be delayed, place a NG tube and confirm placement with an abdominal x-ray.    Romelle Starcher MS, RDN, LDN, CNSC Registered Dietitian III Clinical Nutrition RD Pager and On-Call Pager Number Located in [place]

## 2021-07-05 ENCOUNTER — Inpatient Hospital Stay (HOSPITAL_COMMUNITY): Payer: Medicare Other

## 2021-07-05 DIAGNOSIS — E7800 Pure hypercholesterolemia, unspecified: Secondary | ICD-10-CM

## 2021-07-05 DIAGNOSIS — I6389 Other cerebral infarction: Secondary | ICD-10-CM | POA: Diagnosis not present

## 2021-07-05 DIAGNOSIS — E871 Hypo-osmolality and hyponatremia: Secondary | ICD-10-CM

## 2021-07-05 DIAGNOSIS — I482 Chronic atrial fibrillation, unspecified: Secondary | ICD-10-CM

## 2021-07-05 DIAGNOSIS — D72829 Elevated white blood cell count, unspecified: Secondary | ICD-10-CM | POA: Diagnosis not present

## 2021-07-05 DIAGNOSIS — I4891 Unspecified atrial fibrillation: Secondary | ICD-10-CM | POA: Diagnosis not present

## 2021-07-05 DIAGNOSIS — I619 Nontraumatic intracerebral hemorrhage, unspecified: Secondary | ICD-10-CM | POA: Diagnosis not present

## 2021-07-05 DIAGNOSIS — I4819 Other persistent atrial fibrillation: Secondary | ICD-10-CM | POA: Diagnosis not present

## 2021-07-05 LAB — GLUCOSE, CAPILLARY
Glucose-Capillary: 178 mg/dL — ABNORMAL HIGH (ref 70–99)
Glucose-Capillary: 182 mg/dL — ABNORMAL HIGH (ref 70–99)
Glucose-Capillary: 145 mg/dL — ABNORMAL HIGH (ref 70–99)
Glucose-Capillary: 153 mg/dL — ABNORMAL HIGH (ref 70–99)
Glucose-Capillary: 140 mg/dL — ABNORMAL HIGH (ref 70–99)

## 2021-07-05 LAB — HEPATIC FUNCTION PANEL
ALT: 35 U/L (ref 0–44)
AST: 52 U/L — ABNORMAL HIGH (ref 15–41)
Albumin: 2.5 g/dL — ABNORMAL LOW (ref 3.5–5.0)
Alkaline Phosphatase: 58 U/L (ref 38–126)
Bilirubin, Direct: 0.2 mg/dL (ref 0.0–0.2)
Indirect Bilirubin: 0 mg/dL — ABNORMAL LOW (ref 0.3–0.9)
Total Bilirubin: 0.2 mg/dL — ABNORMAL LOW (ref 0.3–1.2)
Total Protein: 5.3 g/dL — ABNORMAL LOW (ref 6.5–8.1)

## 2021-07-05 LAB — CBC
HCT: 35.7 % — ABNORMAL LOW (ref 36.0–46.0)
Hemoglobin: 11.5 g/dL — ABNORMAL LOW (ref 12.0–15.0)
MCH: 32.4 pg (ref 26.0–34.0)
MCHC: 32.2 g/dL (ref 30.0–36.0)
MCV: 100.6 fL — ABNORMAL HIGH (ref 80.0–100.0)
Platelets: 161 10*3/uL (ref 150–400)
RBC: 3.55 MIL/uL — ABNORMAL LOW (ref 3.87–5.11)
RDW: 13.4 % (ref 11.5–15.5)
WBC: 9.9 10*3/uL (ref 4.0–10.5)
nRBC: 0 % (ref 0.0–0.2)

## 2021-07-05 LAB — BASIC METABOLIC PANEL
Anion gap: 9 (ref 5–15)
BUN: 25 mg/dL — ABNORMAL HIGH (ref 8–23)
CO2: 18 mmol/L — ABNORMAL LOW (ref 22–32)
Calcium: 8.1 mg/dL — ABNORMAL LOW (ref 8.9–10.3)
Chloride: 108 mmol/L (ref 98–111)
Creatinine, Ser: 0.79 mg/dL (ref 0.44–1.00)
GFR, Estimated: >60 mL/min (ref >60)
Glucose, Bld: 119 mg/dL — ABNORMAL HIGH (ref 70–99)
Potassium: 3.6 mmol/L (ref 3.5–5.1)
Sodium: 135 mmol/L (ref 135–145)

## 2021-07-05 LAB — PHOSPHORUS
Phosphorus: 1.4 mg/dL — ABNORMAL LOW (ref 2.5–4.6)
Phosphorus: 1.3 mg/dL — ABNORMAL LOW (ref 2.5–4.6)

## 2021-07-05 LAB — MAGNESIUM
Magnesium: 2 mg/dL (ref 1.7–2.4)
Magnesium: 1.9 mg/dL (ref 1.7–2.4)

## 2021-07-05 MED ORDER — HEPARIN SODIUM (PORCINE) 5000 UNIT/ML IJ SOLN
5000.0000 [IU] | Freq: Two times a day (BID) | INTRAMUSCULAR | Status: DC
Start: 2021-07-05 — End: 2021-07-08
  Administered 2021-07-05 – 2021-07-07 (×5): 5000 [IU] via SUBCUTANEOUS
  Filled 2021-07-05 (×5): qty 1

## 2021-07-05 MED ORDER — PANTOPRAZOLE 2 MG/ML SUSPENSION
40.0000 mg | Freq: Every day | ORAL | Status: DC
  Administered 2021-07-05 – 2021-07-10 (×6): 40 mg
  Filled 2021-07-05 (×6): qty 20

## 2021-07-05 MED ORDER — FUROSEMIDE 10 MG/ML IJ SOLN
20.0000 mg | Freq: Once | INTRAMUSCULAR | Status: AC
  Administered 2021-07-05: 20 mg via INTRAVENOUS
  Filled 2021-07-05: qty 2

## 2021-07-05 MED ORDER — LABETALOL HCL 5 MG/ML IV SOLN: 10.0000 mg | INTRAVENOUS | Status: DC | PRN

## 2021-07-05 MED ORDER — LABETALOL HCL 5 MG/ML IV SOLN: 5.0000 mg | INTRAVENOUS | Status: DC | PRN

## 2021-07-05 MED ORDER — SENNOSIDES 8.8 MG/5ML PO SYRP
10.0000 mL | ORAL_SOLUTION | Freq: Two times a day (BID) | ORAL | Status: DC
  Administered 2021-07-06 – 2021-07-10 (×8): 10 mL
  Filled 2021-07-05 (×14): qty 10

## 2021-07-05 MED ORDER — METOPROLOL TARTRATE 25 MG/10 ML ORAL SUSPENSION
12.5000 mg | Freq: Four times a day (QID) | ORAL | Status: DC
  Administered 2021-07-05 – 2021-07-06 (×3): 12.5 mg
  Filled 2021-07-05: qty 10
  Filled 2021-07-05 (×3): qty 5

## 2021-07-05 MED ORDER — METOPROLOL TARTRATE 25 MG PO TABS
12.5000 mg | ORAL_TABLET | Freq: Four times a day (QID) | ORAL | Status: DC
  Filled 2021-07-05: qty 1

## 2021-07-05 NOTE — Evaluation (Signed)
Occupational Therapy Evaluation Patient Details Name: Gwendolyn [name] MRN: [id number] DOB: 26-Jan-1936 Today's Date: 07/05/2021   History of Present Illness 86 y.o. female presents to [facility] on 07/03/2021 with AMS, L weakness, slurred speech. Pt in afib with RVR in ED. CT head with acute R BG hemorrhage. PMH includes afib, HTN, syncope, COVID 3/23   Clinical Impression   This 86 yo female admitted with above presents to acute OT with PLOF of totally independent with basic ADLs and living at a hotel. Currently she is total A +2 for all mobility and any ADLs that require standing. 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 She will continue to benefit from acute OT with follow at SNF.      Recommendations for follow up therapy are one component of a multi-disciplinary discharge planning process, led by the attending physician.  Recommendations may be updated based on patient status, additional functional criteria and insurance authorization.   Follow Up Recommendations  Skilled nursing-short term rehab (<3 hours/day)    Assistance Recommended at Discharge Frequent or constant Supervision/Assistance  Patient can return home with the following Two people to help with bathing/dressing/bathroom;Two people to help with walking and/or transfers;Assistance with cooking/housework;Assistance with feeding;Assist for transportation;Direct supervision/assist for financial management;Direct supervision/assist for medications management;Help with stairs or ramp for entrance    Functional Status Assessment  Patient has had a recent decline in their functional status and demonstrates the ability to make significant improvements in function in a reasonable and predictable amount of time.  Equipment Recommendations  Other (comment) (TBD next venye)       Precautions / Restrictions Precautions Precautions: Fall Precaution Comments: left neglect, left dense hemiplegia Restrictions Weight  Bearing Restrictions: No      Mobility Bed Mobility Overal bed mobility: Needs Assistance Bed Mobility: Supine to Sit     Supine to sit: Total assist, +2 for physical assistance, HOB elevated     General bed mobility comments: with VCs pt initiated moving her RLE towards EOB but needed A for all other mobiity to get to EOB from this point    Transfers Overall transfer level: Needs assistance   Transfers: Sit to/from Stand, Bed to chair/wheelchair/BSC Sit to Stand: Total assist, +2 physical assistance Stand pivot transfers: Total assist, +2 physical assistance         General transfer comment: gait belt and bed pad use to help pt stand and then stand and pivot      Balance Overall balance assessment: Needs assistance Sitting-balance support: Single extremity supported, Feet supported   Sitting balance - Comments: pt ranged from min guard A when sitting EOB propped on right forearm to mod-max A otherwise in sitting   Standing balance support: Single extremity supported Standing balance-Leahy Scale: Zero Standing balance comment: total A for standing balance with pt initiating sit>stand but then no more                           ADL either performed or assessed with clinical judgement   ADL Overall ADL's : Needs assistance/impaired Eating/Feeding: NPO   Grooming: Wash/dry face;Bed level Grooming Details (indicate cue type and reason): pt initally only washed right side of face--need VCs and hand over hand to initate washing left side of face Upper Body Bathing: Maximal assistance;Bed level   Lower Body Bathing: Total assistance;Bed level   Upper Body Dressing : Total assistance;Bed level   Lower Body Dressing: Total assistance;Bed level  Toilet Transfer: Total assistance;+2 for physical assistance;Stand-pivot Toilet Transfer Details (indicate cue type and reason): simulated bed>recliner on her right Toileting- Clothing Manipulation and Hygiene: Total  assistance Toileting - Clothing Manipulation Details (indicate cue type and reason): Total A for standing balance             Vision Baseline Vision/History: 0 No visual deficits Ability to See in Adequate Light: 0 Adequate Patient Visual Report: No change from baseline Vision Assessment?: Yes Eye Alignment: Impaired (comment) Ocular Range of Motion: Restricted on the left Alignment/Gaze Preference: Head turned;Gaze right Tracking/Visual Pursuits:  (get move her eyes to left to midline when cued but cannot maintain)            Pertinent Vitals/Pain Pain Assessment Pain Assessment: No/denies pain     Hand Dominance Right   Extremity/Trunk Assessment Upper Extremity Assessment Upper Extremity Assessment: LUE deficits/detail LUE Deficits / Details: dense hemiplegia, currently with full PROM LUE Sensation: decreased light touch (absent pain sensation) LUE Coordination: decreased gross motor;decreased fine motor           Communication Communication Communication: No difficulties   Cognition Arousal/Alertness: Awake/alert Behavior During Therapy: Flat affect Overall Cognitive Status: Impaired/Different from baseline Area of Impairment: Orientation, Attention, Following commands, Safety/judgement, Awareness, Problem solving                 Orientation Level: Situation, Disoriented to Current Attention Level: Sustained   Following Commands: Follows one step commands inconsistently, Follows one step commands with increased time Safety/Judgement: Decreased awareness of deficits, Decreased awareness of safety Awareness: Intellectual Problem Solving: Slow processing, Decreased initiation, Requires tactile cues, Requires verbal cues, Difficulty sequencing                  Home Living Family/patient expects to be discharged to:: Skilled nursing facility                                 Additional Comments: Pt was living at hotel pta--does not  have any support here in  (sister has dementia). Niece Gwendolyn [name]) is per next of kin  Lives With: Alone    Prior Functioning/Environment Prior Level of Function : Independent/Modified Independent                        OT Problem List: Decreased strength;Decreased range of motion;Impaired balance (sitting and/or standing);Impaired vision/perception;Decreased coordination;Decreased cognition;Decreased safety awareness;Impaired sensation;Impaired tone;Impaired UE functional use      OT Treatment/Interventions: Self-care/ADL training;DME and/or AE instruction;Patient/family education;Balance training;Visual/perceptual remediation/compensation;Therapeutic activities;Therapeutic exercise;Neuromuscular education    OT Goals(Current goals can be found in the care plan section) Acute Rehab OT Goals Patient Stated Goal: agreeable to get OOB and wants some coffee OT Goal Formulation: With patient Time For Goal Achievement: 07/19/21 Potential to Achieve Goals: Good  OT Frequency: Min 2X/week    Co-evaluation PT/OT/SLP Co-Evaluation/Treatment: Yes Reason for Co-Treatment: For patient/therapist safety PT goals addressed during session: Mobility/safety with mobility;Balance;Strengthening/ROM OT goals addressed during session: Strengthening/ROM;ADL's and self-care      AM-PAC OT "6 Clicks" Daily Activity     Outcome Measure Help from another person eating meals?: Total Help from another person taking care of personal grooming?: Total Help from another person toileting, which includes using toliet, bedpan, or urinal?: Total Help from another person bathing (including washing, rinsing, drying)?: Total Help from another person to put on and taking off regular upper body clothing?: Total Help  from another person to put on and taking off regular lower body clothing?: Total 6 Click Score: 6   End of Session Equipment Utilized During Treatment: Gait belt Nurse Communication: Mobility  status;Need for lift equipment (maxi sky (pad under pt and lift currently charging))  Activity Tolerance: Patient tolerated treatment well Patient left: in chair;with call bell/phone within reach;with chair alarm set;with nursing/sitter in room  OT Visit Diagnosis: Unsteadiness on feet (R26.81);Other abnormalities of gait and mobility (R26.89);Muscle weakness (generalized) (M62.81);Low vision, both eyes (H54.2);Other symptoms and signs involving cognitive function;Hemiplegia and hemiparesis Hemiplegia - Right/Left: Left Hemiplegia - dominant/non-dominant: Non-Dominant Hemiplegia - caused by: Nontraumatic SAH                Time: 8889-1694 OT Time Calculation (min): 40 min Charges:  OT General Charges $OT Visit: 1 Visit OT Evaluation $OT Eval Moderate Complexity: 1 Mod OT Treatments $Self Care/Home Management : 8-22 mins  Ignacia Palma, OTR/L Acute [employer] Pager [phone] Office [phone]    Evette Georges 07/05/2021, 12:03 PM

## 2021-07-05 NOTE — Progress Notes (Addendum)
STROKE TEAM PROGRESS NOTE   INTERVAL HISTORY Patient is seen in her room with no family at the bedside.  Her neurological exam is slightly worse today with decreased movement on the left side.  She has been hemodynamically stable, but atrial fibrillation continues.  She is ready to transfer out of the ICU today.  Vitals:   07/05/21 0800 07/05/21 0900 07/05/21 1000 07/05/21 1100  BP: 95/60 (!) 97/58 (!) 96/56 92/77  Pulse: (!) 102 92 (!) 102 (!) 118  Resp: (!) 22 18 (!) 21 18  Temp: 98.1 F (36.7 C)     TempSrc: Axillary     SpO2: 99% 98% 100% 97%  Weight:      Height:       CBC:  Recent Labs  Lab 07/03/21 1147 07/04/21 0040 07/05/21 1153  WBC 16.7* 14.1* 9.9  NEUTROABS 14.5*  --   --   HGB 13.9 13.4 11.5*  HCT 41.9 39.2 35.7*  MCV 98.8 98.5 100.6*  PLT 236 180 161    Basic Metabolic Panel:  Recent Labs  Lab 07/03/21 1147 07/04/21 0040  NA 132* 133*  K 4.4 3.9  CL 97* 102  CO2 19* 18*  GLUCOSE 126* 113*  BUN 20 21  CREATININE 1.03* 0.85  CALCIUM 8.8* 8.3*  MG  --  1.8  PHOS  --  3.4    Lipid Panel:  Recent Labs  Lab 07/04/21 0040  CHOL 226*  TRIG 80  HDL 88  CHOLHDL 2.6  VLDL 16  LDLCALC 169*    HgbA1c:  Recent Labs  Lab 07/04/21 0040  HGBA1C 5.7*    Urine Drug Screen:  Recent Labs  Lab 07/03/21 1626  LABOPIA NONE DETECTED  COCAINSCRNUR NONE DETECTED  LABBENZ NONE DETECTED  AMPHETMU NONE DETECTED  THCU NONE DETECTED  LABBARB NONE DETECTED     Alcohol Level No results for input(s): ETH in the last 168 hours.  IMAGING past 24 hours MR BRAIN WO CONTRAST  Result Date: 07/05/2021 CLINICAL DATA:  Altered mental status, left facial droop, slurred speech, left-sided weakness EXAM: MRI HEAD WITHOUT CONTRAST TECHNIQUE: Multiplanar, multiecho pulse sequences of the brain and surrounding structures were obtained without intravenous contrast. COMPARISON:  No prior MRI, correlation is made with CT head 07/03/2021 FINDINGS: Brain: Large area of  restricted diffusion with ADC correlate in the right MCA territory(series 5, images 13-36), primarily involving the right temporal lobe, right frontal lobe, and right basal ganglia with lesser involvement of the right parietal lobe, which also demonstrates some punctate foci (series 5, images 33 and 36). These areas are associated with T2 hyperintense signal, likely cytotoxic edema, with mass effect on the right lateral ventricle but no significant midline shift. Redemonstrated hemorrhagic transformation in the right putamen, measuring up to 2.4 x 0.6 x 1.7 cm, unchanged from the prior exam when accounting for differences in technique, and the smaller hemorrhage in the right caudate, which measures up to 1.7 x 0.9 x 0.9 cm, also likely unchanged from the prior exam when accounting for differences in technique. No additional areas of hemorrhage. No mass, hydrocephalus, or extra-axial collection. The basal cisterns are patent. Normal craniocervical junction. Vascular: Normal arterial flow voids. Skull and upper cervical spine: Normal marrow signal. Sinuses/Orbits: Mild mucosal thickening in the ethmoid air cells. Status post bilateral lens replacements. Other: The mastoids are well aerated. IMPRESSION: 1. Large acute to subacute infarct in the right MCA territory, primarily involving the right temporal lobe, right frontal lobe, and right basal ganglia,  with lesser involvement of the right parietal lobe. These areas are associated with edema with mild mass effect on the right lateral ventricle but no evidence of midline shift or hydrocephalus. 2. Redemonstrated hemorrhagic areas in the right putamen and caudate, which appear unchanged compared to the 07/03/2021 head CT when accounting for differences in technique. No new areas of hemorrhage. Results were communicated on 07/05/2021 at 12:23 am to provider BHAGAT via secure text paging. Electronically Signed   By: Wiliam Ke M.D.   On: 07/05/2021 00:23   DG CHEST  PORT 1 VIEW  Result Date: 07/05/2021 CLINICAL DATA:  Leukocytosis. EXAM: PORTABLE CHEST 1 VIEW COMPARISON:  Chest radiograph May 25, 23. FINDINGS: Similar mild streaky left basilar opacities. No definite pleural effusions or pneumothorax. Similar enlarged cardiac silhouette. Enteric tube courses below the diaphragm with the tip outside the field of view. Contrast within bowel in the partially visualized left upper abdomen. IMPRESSION: Similar mild streaky left basilar opacities, favor atelectasis. Aspiration and/or infection is not excluded. Electronically Signed   By: Feliberto Harts M.D.   On: 07/05/2021 10:01   DG Abd Portable 1V  Result Date: 07/04/2021 CLINICAL DATA:  Feeding tube placement EXAM: PORTABLE ABDOMEN - 1 VIEW COMPARISON:  None Available. FINDINGS: Weighted tip enteric feeding tube projects over the gastric body. Oral contrast material present in the distal small bowel and colon. Moderate volume of formed stool. No obstruction. Caseous calcification noted along the mitral valve annulus. Cardiomegaly. IMPRESSION: The tip of the feeding tube overlies the mid body of the stomach. Electronically Signed   By: Malachy Moan M.D.   On: 07/04/2021 15:01    PHYSICAL EXAM General:  Alert, well-developed, well-nourished elderly Caucasian lady in no acute distress Respiratory:  Regular, unlabored respirations on room air  NEURO:  Mental Status: AA&Ox3 .  Head and gaze deviation to the right Speech/Language: speech is without dysarthria or aphasia.  Fluency, and comprehension intact.  Cranial Nerves:  II: PERRL. Visual fields showed decreased blink to threat on the left III, IV, VI: right gaze deviation unable to look to the left past midline V: Sensation is intact to light touch and symmetrical to face with extinction to DSS on left side VII: left sided facial droop  VIII: hearing intact to voice. IX, X: Phonation is normal.  XII: tongue is midline without fasciculations. Motor:  5/5 strength to RUE and RLE, 0/5 to LUE, 2/5 to LLE Sensation- Intact to light touch on right and in LLE.  Impaired on LUE Extinction present to DSS on the left  Coordination: FTN intact on right Gait- deferred   ASSESSMENT/PLAN Gwendolyn [name] is a 86 y.o. female with history of atrial fibrillation not on anticoagulation, HTN and syncope presenting with altered mental status, left sided facial droop, slurred speech and left sided weakness.  She was found to have an acute stroke in the right basal ganglia with hemorrhagic transformation.  She was noted to be in a-fib with RVR and was initially treated with a diltiazem gtt then switched to amiodarone due to hypotension.  Stroke:  right MCA larege infarct with hemorrhagic transformation likely secondary due to embolism in setting of atrial fibrillation not on anticoagulation CT head multiple acute hemorrhagic infarct in right basal ganglia CTA head & neck distal right M1 MCA stenosis and occlusion of distal right M2 MCA MRI large right MCA infarct with HT at right BG and caudate 2D Echo EF 55%, moderately dilated left atrium, no atrial level shunt LDL 122  HgbA1c 5.7 VTE prophylaxis - heparin subcu aspirin 81 mg daily prior to admission, now on No antithrombotic due to HT Therapy recommendations:  SNF Disposition:  pending  Atrial fibrillation with RVR Patient is in a-fib RVR with rates up to the 160s Not on anticoagulation at home Cardiology on board, appreciate help Was initially treated with diltiazem, now on metoprolol 12.5 every 4h Still on IV amiodarone  Hypertension Home meds:  carvedilol 6.25 mg BID, lisinopril 40 mg daily Stable on the low end Now on metoprolol Long-term BP goal normotensive  Hyperlipidemia Home meds:  none LDL 122, goal < 70 Add atorvastatin 40 mg daily  Continue statin at discharge  Dysphagia Did not pass swallow Has core track placed On tube feeding Speech on board  Other Stroke Risk  Factors Advanced Age >/= 3   Other Active Problems Syncope Leukocytosis WBC 14.1-> 9.9 Elevated AST 85->52  Hospital day # 2  Gwendolyn [name] , MSN, AGACNP-BC [facility] See Amion for schedule and pager information 07/05/2021 1:01 PM   ATTENDING NOTE: I reviewed above note and agree with the assessment and plan. Pt was seen and examined.   86 year old female with history of A-fib not on AC, hypertension, syncope admitted for left-sided weakness, left facial droop, slurred speech and altered mental status.  CT showed right BG and caudate infarct with hemorrhagic transformation.  CT head and neck right distal M1 and proximal M2 high-grade stenosis, distal right M2 occlusion.  MRI showed large right MCA infarct with hemorrhagic transformation at BG/CR and caudate.  EF 55%.  LDL 122, A1c 5.7.  Creatinine 0.85.  AST 85->52, WBC 14.1->9.9  On exam, RN at bedside.  Patient awake, alert, eyes open, orientated to age, place, time. No aphasia, paucity of language but fluent on speaking, mild dysarthria, following all simple commands. Able to name and repeat. Right gaze preference and barely cross midline, blinking to visual field on the right but not on the left, PERRL. Left facial droop. Tongue midline. LUE flaccid. LLE mild withdraw to pain, 1/5 spontaneously. RUE 4/5 and RLE 3/5. Sensation loss and distinction on the left, right FTN intact grossly, gait not tested.   Etiology for patient stroke likely due to A-fib not on [facility].  Currently patient still have A-fib RVR, cardiology consulted, appreciate help.  Currently Cardizem switched to metoprolol, still on amiodarone IV.  Did not pass swallow, on tube feeding.  Continue statin, hold off antithrombotic given hemorrhagic transformation.  PT therapy recommend SNF.  Continue supportive care.  For detailed assessment and plan, please refer to above as I have made changes wherever appropriate.   Marvel Plan, MD PhD Stroke  Neurology 07/05/2021 5:11 PM  This patient is critically ill due to right large MCA infarct with hemorrhagic transformation, A-fib RVR, transaminitis and leukocytosis and at significant risk of neurological worsening, death form recurrent stroke, worsening hemorrhagic transformation, cerebral edema, heart failure, sepsis. This patient's care requires constant monitoring of vital signs, hemodynamics, respiratory and cardiac monitoring, review of multiple databases, neurological assessment, discussion with family, other specialists and medical decision making of high complexity. I spent 40 minutes of neurocritical care time in the care of this patient.     To contact Stroke Continuity provider, please refer to [email or web address]. After hours, contact General Neurology

## 2021-07-05 NOTE — Evaluation (Signed)
Physical Therapy Evaluation Patient Details Name: Gwendolyn [name] MRN: [id number] DOB: [birth year] Today's Date: 07/05/2021  History of Present Illness  86 y.o. female presents to [facility] hospital on 07/03/2021 with AMS, L weakness, slurred speech. Pt in afib with RVR in ED. CT head with acute R BG hemorrhage. PMH includes afib, HTN, syncope, COVID 3/23   Clinical Impression  Pt presents with condition above and deficits mentioned below, see PT Problem List. PTA, she was IND and living in a hotel. Pt does not have much if any support available at d/c. Currently, pt is requiring TA for all mobility secondary to L-sided weakness, impaired L UE sensation/proprioception, incoordination, balance deficits, L inattention, core weakness, cognitive impairments, and activity tolerance deficits. She is at high risk for falls. Attempted to facilitate more midline cervical alignment through providing stretches to rotate neck to L as tolerated and applied heat to tight muscles around neck. Pt able to bring eyes to midline for up to ~2-3 seconds before returning to look to her R. Due to limited support and pt needing extensive physical assistance, recommending rehab at a SNF. Will continue to follow acutely.     Recommendations for follow up therapy are one component of a multi-disciplinary discharge planning process, led by the attending physician.  Recommendations may be updated based on patient status, additional functional criteria and insurance authorization.  Follow Up Recommendations Skilled nursing-short term rehab (<3 hours/day)    Assistance Recommended at Discharge Frequent or constant Supervision/Assistance  Patient can return home with the following  Two people to help with walking and/or transfers;Two people to help with bathing/dressing/bathroom;Assistance with cooking/housework;Assistance with feeding;Direct supervision/assist for medications management;Direct supervision/assist for financial  management;Assist for transportation;Help with stairs or ramp for entrance    Equipment Recommendations Other (comment) (defer to next venue of care)  Recommendations for Other Services       Functional Status Assessment Patient has had a recent decline in their functional status and demonstrates the ability to make significant improvements in function in a reasonable and predictable amount of time.     Precautions / Restrictions Precautions Precautions: Fall Precaution Comments: left neglect, left dense hemiplegia Restrictions Weight Bearing Restrictions: No      Mobility  Bed Mobility Overal bed mobility: Needs Assistance Bed Mobility: Supine to Sit     Supine to sit: Total assist, +2 for physical assistance, HOB elevated     General bed mobility comments: with VCs pt initiated moving her RLE towards EOB but needed A for all other mobiity to get to EOB from this point    Transfers Overall transfer level: Needs assistance Equipment used: 2 person hand held assist Transfers: Sit to/from Stand, Bed to chair/wheelchair/BSC Sit to Stand: Total assist, +2 physical assistance Stand pivot transfers: Total assist, +2 physical assistance         General transfer comment: gait belt and bed pad use to help pt stand and then stand and pivot with L knee blocked. Pt attempting initially to power up but then would cease helping, needing TA from there.    Ambulation/Gait               General Gait Details: unable  Stairs            Wheelchair Mobility    Modified Rankin (Stroke Patients Only)       Balance Overall balance assessment: Needs assistance Sitting-balance support: Single extremity supported, Feet supported Sitting balance-Leahy Scale: Poor Sitting balance - Comments: pt ranged from min guard  A when sitting EOB propped on right forearm to mod-max A otherwise in sitting, leaning to the L and posteriorly Postural control: Left lateral lean, Posterior  lean Standing balance support: Single extremity supported Standing balance-Leahy Scale: Zero Standing balance comment: total A for standing balance with pt initiating sit>stand but then no more, L knee blocked                             Pertinent Vitals/Pain Pain Assessment Pain Assessment: Faces Faces Pain Scale: Hurts little more Pain Location: bil sides of neck Pain Descriptors / Indicators: Discomfort, Grimacing, Guarding Pain Intervention(s): Limited activity within patient's tolerance, Monitored during session, Repositioned, Heat applied, Other (comment) (stretches)    Home Living Family/patient expects to be discharged to:: Skilled nursing facility     Type of Home: Other(Comment) (hotel)             Additional Comments: Pt was living at hotel pta--does not have any support here in [place] (sister has dementia). Niece Lavella Lemons) is per next of kin    Prior Function Prior Level of Function : Independent/Modified Independent                     Hand Dominance   Dominant Hand: Right    Extremity/Trunk Assessment   Upper Extremity Assessment Upper Extremity Assessment: Defer to OT evaluation    Lower Extremity Assessment Lower Extremity Assessment: Generalized weakness;LLE deficits/detail LLE Deficits / Details: Noted gross functional weakness compared to R with difficulty lifting leg up off bed against gravity; detects touch with accuracy, denies numbness/tingling LLE Coordination: decreased gross motor    Cervical / Trunk Assessment Cervical / Trunk Assessment: Kyphotic;Other exceptions Cervical / Trunk Exceptions: holds neck in R cervical rotation and lateral flexion  Communication   Communication: No difficulties  Cognition Arousal/Alertness: Awake/alert Behavior During Therapy: Flat affect Overall Cognitive Status: Impaired/Different from baseline Area of Impairment: Orientation, Attention, Following commands, Safety/judgement, Awareness,  Problem solving                 Orientation Level: Disoriented to, Situation Current Attention Level: Sustained   Following Commands: Follows one step commands inconsistently, Follows one step commands with increased time Safety/Judgement: Decreased awareness of deficits, Decreased awareness of safety Awareness: Intellectual Problem Solving: Slow processing, Decreased initiation, Requires tactile cues, Requires verbal cues, Difficulty sequencing General Comments: Pt is able to identify the direction in which she is falling often and able to problem-solve to correct it when cued. Pt with poor awareness of her L inattention and tendency to rest with her neck rotated to the R. Increased time and multi-modal cues for all tasks. Unaware of situation, but able to recall it several min later after being oriented to it        General Comments General comments (skin integrity, edema, etc.): VSS on RA    Exercises Other Exercises Other Exercises: Stretches provided to neck into L rotation Other Exercises: Placed pt with chair facing TV directly to encourage pt to rotate head and eyes to see TV to increase L side attention and cervical ROM   Assessment/Plan    PT Assessment Patient needs continued PT services  PT Problem List Decreased strength;Decreased activity tolerance;Decreased balance;Decreased coordination;Decreased range of motion;Decreased mobility;Decreased cognition;Decreased safety awareness;Impaired sensation       PT Treatment Interventions DME instruction;Gait training;Functional mobility training;Therapeutic exercise;Therapeutic activities;Neuromuscular re-education;Balance training;Cognitive remediation;Patient/family education;Wheelchair mobility training    PT Goals (Current goals  can be found in the Care Plan section)  Acute Rehab PT Goals Patient Stated Goal: to get better PT Goal Formulation: With patient Time For Goal Achievement: 07/19/21 Potential to Achieve  Goals: Fair    Frequency Min 3X/week     Co-evaluation PT/OT/SLP Co-Evaluation/Treatment: Yes Reason for Co-Treatment: For patient/therapist safety;To address functional/ADL transfers PT goals addressed during session: Mobility/safety with mobility;Balance         AM-PAC PT "6 Clicks" Mobility  Outcome Measure Help needed turning from your back to your side while in a flat bed without using bedrails?: Total Help needed moving from lying on your back to sitting on the side of a flat bed without using bedrails?: Total Help needed moving to and from a bed to a chair (including a wheelchair)?: Total Help needed standing up from a chair using your arms (e.g., wheelchair or bedside chair)?: Total Help needed to walk in hospital room?: Total Help needed climbing 3-5 steps with a railing? : Total 6 Click Score: 6    End of Session Equipment Utilized During Treatment: Gait belt Activity Tolerance: Patient tolerated treatment well Patient left: in chair;with call bell/phone within reach;with family/visitor present Nurse Communication: Mobility status;Need for lift equipment PT Visit Diagnosis: Unsteadiness on feet (R26.81);Muscle weakness (generalized) (M62.81);Difficulty in walking, not elsewhere classified (R26.2);Other symptoms and signs involving the nervous system [id number])    Time: [id number] PT Time Calculation (min) (ACUTE ONLY): 39 min   Charges:   PT Evaluation $PT Eval Moderate Complexity: 1 Mod          Moishe Spice, PT, DPT Acute Rehabilitation Services  Pager: [phone] Office: [phone]   Orvan Falconer 07/05/2021, 4:10 PM

## 2021-07-05 NOTE — Consult Note (Signed)
Cardiology Consultation:   Patient ID: Gwendolyn [name] MRN: [id number]; DOB: September 15, 1935  Admit date: 07/03/2021 Date of Consult: 07/05/2021  PCP:  Irena Reichmann, DO   [facility] Providers Cardiologist:  Chrystie Nose, MD        Patient Profile:   Gwendolyn [name] is a 86 y.o. female with a hx of atrial fib from 03/2017 placed on Eliquis but stopped sometime thereafter and  never returned for followup, hx of syncope who is being seen 07/05/2021 after admit for AMS found to have multiple acute hemorrhages in Rt basal ganglia with associated edema without mass effect for the evaluation of atrial fib at the request of Dr Jetta Lout.  History of Present Illness:   Gwendolyn [name] with hx of atrial fib in 2019 and placed on eliquis but this was stopped perhaps due to hx of falls and syncope. Seen in ER 05/01/21 with rt posterior parietal scalp hematoma no intracranial traumatic finding.  This was with syncope in the bank.    07/03/21  she was picked up by EMS at [facility], where she lives.  On ER presentation found with confusion, lt facial droop and slurred speech with lt sided weakness   CT head with Acute intraparenchymal hemorrhages within the right basal ganglia, detailed above. Associated edema without significant mass effect. CTA of head with Marked stenosis of the distal right M1 MCA as it bifurcates with moderate to marked stenosis of the proximal anterior and posterior divisions. There is occlusion of a distal right M2 MCA branch. No abnormal vascularity in area of hemorrhage on the prior noncontrast head CT. Given the vascular findings, this may reflect hemorrhagic transformation of an ischemic infarct.     In addition 1 mm inferiorly directed outpouching from the distal supraclinoid left ICA may reflect an infundibulum or aneurysm.   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 These areas are associated with edema with mild mass effect on the right lateral ventricle but no evidence of midline shift or hydrocephalus.   Redemonstrated hemorrhagic areas in the right putamen and caudate, which appear unchanged compared to the 07/03/2021 head CT  EKG:  The EKG was personally reviewed and demonstrates:  HR 150-160 but artifact is severe difficult to know the rhythm.  Prior EKGs in March with HR 118 difficult to see P wave - in 2019 and 2021 she was in SR with clear P waves.  And PACs. Telemetry:  Telemetry was personally reviewed and demonstrates:  atrial fib improved rate control   She has been placed on IV dilt without much improvement.  She also has episodes of hypotension. She has been placed on amiodarone and on po dilt.    Echo this admit with EF 55% with beat to beat variability.  No RWMA. Anomalous chord inserting into basal septum.  RV systolic function is mildly reduced.  LA mod dilated as was the right. Valves stable.   Na 135 K+ 3.6 BUN 25 Cr 0.79 AST 52  HGB 11.5  CK 3,851  to 4,387 TCHOL 226, HDL 88 LDL 122  PCXR today IMPRESSION: Similar mild streaky left basilar opacities, favor atelectasis. Aspiration and/or infection is not excluded.    BP 97/58 to 100/77 P 108 to 120 R 20 afebrile  Past Medical History:  Diagnosis Date   A-fib (HCC)  Atrial fibrillation (HCC)    Hypertension    Loss of consciousness (HCC)    Syncope     Past Surgical History:  Procedure Laterality Date   BARTHOLIN GLAND CYST EXCISION       Home Medications:  Prior to Admission medications   Medication Sig Start Date End Date Taking? Authorizing Provider  amLODipine (NORVASC) 5 MG tablet Take 5 mg by mouth daily.   Yes [provider]  aspirin EC 81 MG tablet Take 81 mg by mouth daily. Swallow whole.   Yes [provider]  carvedilol (COREG) 6.25 MG tablet Take 6.25 mg by mouth 2 (two)  times daily with a meal.   Yes [provider]  Cholecalciferol (VITAMIN D3) 50000 units CAPS Take 50,000 Units by mouth every Sunday.   Yes [provider]  lisinopril (PRINIVIL,ZESTRIL) 40 MG tablet Take 40 mg by mouth daily.   Yes [provider]    Inpatient Medications: Scheduled Meds:  atorvastatin  40 mg Per Tube Daily   Chlorhexidine Gluconate Cloth  6 each Topical Q0600   diltiazem  60 mg Per Tube Q6H   feeding supplement (PROSource TF)  45 mL Per Tube BID   multivitamin with minerals  1 tablet Per Tube Daily   pantoprazole (PROTONIX) IV  40 mg Intravenous QHS   polyethylene glycol  17 g Per Tube Daily   sennosides  10 mL Per Tube BID   Continuous Infusions:  sodium chloride 40 mL/hr at 07/05/21 0800   amiodarone 30 mg/hr (07/05/21 0800)   feeding supplement (OSMOLITE 1.5 CAL) 1,000 mL (07/04/21 1709)   PRN Meds: acetaminophen **OR** acetaminophen (TYLENOL) oral liquid 160 mg/5 mL **OR** acetaminophen, labetalol  Allergies:   No Known Allergies  Social History:   Social History   Socioeconomic History   Marital status: Widowed    Spouse name: Not on file   Number of children: Not on file   Years of education: Not on file   Highest education level: Not on file  Occupational History   Not on file  Tobacco Use   Smoking status: Never   Smokeless tobacco: Never  Vaping Use   Vaping Use: Never used  Substance and Sexual Activity   Alcohol use: Yes    Comment: occasional   Drug use: Never   Sexual activity: Not on file  Other Topics Concern   Not on file  Social History Narrative   ** Merged History Encounter **       Social Determinants of Health   Financial Resource Strain: Not on file  Food Insecurity: Not on file  Transportation Needs: Not on file  Physical Activity: Not on file  Stress: Not on file  Social Connections: Not on file  Intimate Partner Violence: Not on file    Family History:    Family History  Problem  Relation Age of Onset   Parkinson's disease Mother    Lung cancer Father    Alzheimer's disease Sister    Cirrhosis Brother    Diabetes Sister    Arthritis Sister      ROS:  Please see the history of present illness.  General:no colds or fevers, no weight changes Skin:no rashes or ulcers HEENT:no blurred vision, no congestion CV:see HPI PUL:see HPI GI:no diarrhea constipation or melena, no indigestion GU:no hematuria, no dysuria MS:no joint pain, no claudication Neuro:+ syncope in March, no lightheadedness Endo:no diabetes, no thyroid disease   All other ROS reviewed and negative.  Physical Exam/Data:   Vitals:   07/05/21 0900 07/05/21 1000 07/05/21 1100 07/05/21 1200  BP: (!) 97/58 (!) 96/56 92/77   Pulse: 92 (!) 102 (!) 118   Resp: 18 (!) 21 18   Temp:    98.9 F (37.2 C)  TempSrc:    Axillary  SpO2: 98% 100% 97%   Weight:      Height:        Intake/Output Summary (Last 24 hours) at 07/05/2021 1353 Last data filed at 07/05/2021 1100 Gross per 24 hour  Intake 2407.23 ml  Output 725 ml  Net 1682.23 ml      07/05/2021    6:00 AM 07/03/2021   11:39 AM 02/12/2020    3:36 PM  Last 3 Weights  Weight (lbs) 124 lb 9 oz 117 lb 130 lb 1.1 oz  Weight (kg) 56.5 kg 53.071 kg 59 kg     Body mass index is 23.54 kg/m.  General:  frail female, in no acute distress HEENT: normal Neck: no JVD Vascular: No carotid bruits; Distal pulses 2+ bilaterally Cardiac:  normal S1, S2; RRR; no murmur gallup rub  Lungs:  clear to auscultation bilaterally, no wheezing, rhonchi or rales  Abd: soft, nontender, no hepatomegaly  Ext: no edema Musculoskeletal:  No deformities, BUE and BLE strength normal and equal Skin: warm and dry  Neuro:  CNs 2-12 intact, no focal abnormalities noted Psych:  Normal affect    Relevant CV Studies: 1. Left ventricular ejection fraction, by estimation, is 55% with beat to  beat variability. The left ventricle has normal function. The left  ventricle  has no regional wall motion abnormalities. Left ventricular  diastolic parameters are indeterminate.   2. Right ventricular systolic function is mildly reduced. The right  ventricular size is normal. There is normal pulmonary artery systolic  pressure. The estimated right ventricular systolic pressure is 23.8 mmHg.   3. Left atrial size was moderately dilated.   4. Right atrial size was moderately dilated.   5. The mitral valve is degenerative. Trivial mitral valve regurgitation.  No evidence of mitral stenosis. Severe mitral annular calcification.   6. The aortic valve is grossly normal. There is mild calcification of the  aortic valve. There is mild thickening of the aortic valve. Aortic valve  regurgitation is not visualized. Aortic valve sclerosis is present, with  no evidence of aortic valve  stenosis.   7. The inferior vena cava is normal in size with <50% respiratory  variability, suggesting right atrial pressure of 8 mmHg.   Conclusion(s)/Recommendation(s): No intracardiac source of embolism  detected on this transthoracic study. Consider a transesophageal  echocardiogram to exclude cardiac source of embolism if clinically  indicated.   FINDINGS   Left Ventricle: Anomalous chord inserting into basal septum. Left  ventricular ejection fraction, by estimation, is 55%. The left ventricle  has normal function. The left ventricle has no regional wall motion  abnormalities. The left ventricular internal  cavity size was normal in size. There is no left ventricular hypertrophy.  Left ventricular diastolic parameters are indeterminate.   Right Ventricle: The right ventricular size is normal. No increase in  right ventricular wall thickness. Right ventricular systolic function is  mildly reduced. There is normal pulmonary artery systolic pressure. The  tricuspid regurgitant velocity is 1.99  m/s, and with an assumed right atrial pressure of 8 mmHg, the estimated  right ventricular  systolic pressure is 23.8 mmHg.   Left Atrium: Left atrial size was moderately dilated.  Right Atrium: Right atrial size was moderately dilated.   Pericardium: Trivial pericardial effusion is present.   Mitral Valve: The mitral valve is degenerative in appearance. Severe  mitral annular calcification. Trivial mitral valve regurgitation. No  evidence of mitral valve stenosis. The mean mitral valve gradient is 1.2  mmHg with average heart rate of 136 bpm.   Tricuspid Valve: The tricuspid valve is normal in structure. Tricuspid  valve regurgitation is mild . No evidence of tricuspid stenosis.   Aortic Valve: The aortic valve is grossly normal. There is mild  calcification of the aortic valve. There is mild thickening of the aortic  valve. Aortic valve regurgitation is not visualized. Aortic valve  sclerosis is present, with no evidence of aortic  valve stenosis. Aortic valve mean gradient measures 2.0 mmHg. Aortic valve  peak gradient measures 3.8 mmHg. Aortic valve area, by VTI measures 1.64  cm.   Pulmonic Valve: The pulmonic valve was normal in structure. Pulmonic valve  regurgitation is trivial. No evidence of pulmonic stenosis.   Aorta: The aortic root is normal in size and structure.   Venous: The inferior vena cava is normal in size with less than 50%  respiratory variability, suggesting right atrial pressure of 8 mmHg.   IAS/Shunts: No atrial level shunt detected by color flow Doppler.       Laboratory Data:  High Sensitivity Troponin:  No results for input(s): TROPONINIHS in the last 720 hours.   Chemistry Recent Labs  Lab 07/03/21 1147 07/04/21 0040 07/05/21 1153  NA 132* 133* 135  K 4.4 3.9 3.6  CL 97* 102 108  CO2 19* 18* 18*  GLUCOSE 126* 113* 119*  BUN 20 21 25*  CREATININE 1.03* 0.85 0.79  CALCIUM 8.8* 8.3* 8.1*  MG  --  1.8 2.0  GFRNONAA 54* >60 >60  ANIONGAP 16* 13 9    Recent Labs  Lab 07/03/21 1147 07/04/21 0040 07/05/21 1153  PROT 7.0  6.2* 5.3*  ALBUMIN 3.6 3.1* 2.5*  AST 86* 85* 52*  ALT 41 43 35  ALKPHOS 80 73 58  BILITOT 1.7* 1.1 0.2*   Lipids  Recent Labs  Lab 07/04/21 0040  CHOL 226*  TRIG 80  HDL 88  LDLCALC 122*  CHOLHDL 2.6    Hematology Recent Labs  Lab 07/03/21 1147 07/04/21 0040 07/05/21 1153  WBC 16.7* 14.1* 9.9  RBC 4.24 3.98 3.55*  HGB 13.9 13.4 11.5*  HCT 41.9 39.2 35.7*  MCV 98.8 98.5 100.6*  MCH 32.8 33.7 32.4  MCHC 33.2 34.2 32.2  RDW 13.0 13.4 13.4  PLT 236 180 161   Thyroid No results for input(s): TSH, FREET4 in the last 168 hours.  BNPNo results for input(s): BNP, PROBNP in the last 168 hours.  DDimer No results for input(s): DDIMER in the last 168 hours.   Radiology/Studies:  CT ANGIO HEAD NECK W WO CM  Result Date: 07/03/2021 CLINICAL DATA:  Transient ischemic attack (TIA); left facial droop; intracranial hemorrhage on CT head EXAM: CT ANGIOGRAPHY HEAD AND NECK TECHNIQUE: Multidetector CT imaging of the head and neck was performed using the standard protocol during bolus administration of intravenous contrast. Multiplanar CT image reconstructions and MIPs were obtained to evaluate the vascular anatomy. Carotid stenosis measurements (when applicable) are obtained utilizing NASCET criteria, using the distal internal carotid diameter as the denominator. RADIATION DOSE REDUCTION: This exam was performed according to the departmental dose-optimization program which includes automated exposure control, adjustment of the mA and/or kV according to patient  size and/or use of iterative reconstruction technique. CONTRAST:  75mL OMNIPAQUE IOHEXOL 350 MG/ML SOLN COMPARISON:  None Available. FINDINGS: CTA NECK Aortic arch: Mild calcified plaque. Great vessel origins are patent. Right carotid system: Patent. Mild calcified plaque at the bifurcation and proximal internal carotid. No stenosis. Irregularity of the mid to distal cervical ICA with an undulating appearance. Left carotid system: Patent.  Trace calcified plaque at the bifurcation. No stenosis. Irregularity of the mid to distal cervical ICA to lesser extent than on the right. Vertebral arteries: Patent and codominant. Trace calcified plaque at the right vertebral origin. Skeleton: Cervical spine degenerative changes. Other neck: Unremarkable. Upper chest: Partially imaged small right pleural effusion with adjacent atelectasis. Review of the MIP images confirms the above findings CTA HEAD Anterior circulation: Intracranial internal carotid arteries are patent with calcified plaque causing mild stenosis. Small inferiorly directed infundibulum distal supraclinoid of the right ICA. A 1 mm inferiorly directed outpouching on the left is also noted without definite associated vessel origin. Anterior and middle cerebral arteries are patent. There is marked stenosis of the distal right M1 MCA as it bifurcates. There is moderate to marked stenosis of the proximal anterior and posterior divisions. Diminished flow is seen within distal vessels of the right MCA territory. There is occlusion of a distal right M2 branch (series 10, image 15). Posterior circulation: Intracranial vertebral arteries are patent. Basilar artery is patent. Major cerebellar artery origins are patent. Posterior cerebral arteries are patent. Venous sinuses: Not well evaluated on this study. Review of the MIP images confirms the above findings IMPRESSION: Marked stenosis of the distal right M1 MCA as it bifurcates with moderate to marked stenosis of the proximal anterior and posterior divisions. There is occlusion of a distal right M2 MCA branch. No abnormal vascularity in area of hemorrhage on the prior noncontrast head CT. Given the vascular findings, this may reflect hemorrhagic transformation of an ischemic infarct. No occlusion or hemodynamically significant stenosis in the neck. There is irregularity of the right greater than left cervical internal carotid arteries suggesting an  underlying vasculopathy such as fibromuscular dysplasia. 1 mm inferiorly directed outpouching from the distal supraclinoid left ICA may reflect an infundibulum or aneurysm. Partially imaged small right pleural effusion with adjacent atelectasis. Electronically Signed   By: Guadlupe Spanish M.D.   On: 07/03/2021 14:46   CT HEAD WO CONTRAST  Result Date: 07/03/2021 CLINICAL DATA:  Intracranial hemorrhage, follow-up EXAM: CT HEAD WITHOUT CONTRAST TECHNIQUE: Contiguous axial images were obtained from the base of the skull through the vertex without intravenous contrast. RADIATION DOSE REDUCTION: This exam was performed according to the departmental dose-optimization program which includes automated exposure control, adjustment of the mA and/or kV according to patient size and/or use of iterative reconstruction technique. COMPARISON:  07/03/2021 12:01 p.m. FINDINGS: Brain: Redemonstrated acute hemorrhages in the right basal ganglia, with a right putaminal hemorrhage that measures approximately 2.6 x 0.9 x 1.8 cm (AP x TR x CC) (series 3, image 16 and series 5, image 33), previously 2.4 x 0.8 x 1.6 cm when remeasured similarly, and a smaller hemorrhage in the right caudate that measures approximately 1.6 x 1.1 x 0.9 cm (AP x TR x CC) (series 3, image 20 and series 5, image 33), previously 1.6 x 1.0 x 0.5 cm when remeasured similarly. Overall unchanged degree of edema, without significant mass effect. No evidence of large vascular territory infarct. No hydrocephalus or extra-axial collection. Periventricular white matter changes, likely the sequela of chronic small vessel ischemic  disease. Vascular: No hyperdense vessel. Skull: No acute osseous abnormality. Sinuses/Orbits: No acute finding. Status post bilateral lens replacements. Other: The mastoids are well aerated. IMPRESSION: 1. Redemonstrated acute intraparenchymal hemorrhages in the right basal ganglia, which have both slightly increased in size compared to the  prior exam, with overall unchanged edema and without significant mass effect. 2.   No additional acute intracranial process. These results will be called to the ordering clinician or representative by the Radiologist Assistant, and communication documented in the PACS or [employer]. Electronically Signed   By: Wiliam Ke M.D.   On: 07/03/2021 22:29   CT HEAD WO CONTRAST ( )  Result Date: 07/03/2021 CLINICAL DATA:  Neuro deficit, acute, stroke suspected EXAM: CT HEAD WITHOUT CONTRAST TECHNIQUE: Contiguous axial images were obtained from the base of the skull through the vertex without intravenous contrast. RADIATION DOSE REDUCTION: This exam was performed according to the departmental dose-optimization program which includes automated exposure control, adjustment of the mA and/or kV according to patient size and/or use of iterative reconstruction technique. COMPARISON:  CT head May 01, 2021. FINDINGS: Brain: Multiple acute hemorrhages in the right basal ganglia, including a hemorrhage in the right putamen that measures approximately 2.9 x 0.7 x 1.8 cm and smaller hemorrhage in the right caudate that measures approximately 1.4 x 0.8 x 1.1 cm. Surrounding edema without significant mass effect. No evidence of acute large vascular territory infarct. Patchy white matter hypodensities, nonspecific but compatible with chronic microvascular ischemic disease. No hydrocephalus or obvious mass lesion. Vascular: Calcific intracranial atherosclerosis. Skull: No acute fracture. Sinuses/Orbits: Clear sinuses.  No acute orbital findings. Other: No mastoid effusions. IMPRESSION: Acute intraparenchymal hemorrhages within the right basal ganglia, detailed above. Associated edema without significant mass effect. Findings discussed with Dr. Iran Planas via telephone at 12:19 PM. Electronically Signed   By: Feliberto Harts M.D.   On: 07/03/2021 12:22   MR BRAIN WO CONTRAST  Result Date: 07/05/2021 CLINICAL DATA:  Altered  mental status, left facial droop, slurred speech, left-sided weakness EXAM: MRI HEAD WITHOUT CONTRAST TECHNIQUE: Multiplanar, multiecho pulse sequences of the brain and surrounding structures were obtained without intravenous contrast. COMPARISON:  No prior MRI, correlation is made with CT head 07/03/2021 FINDINGS: Brain: Large area of restricted diffusion with ADC correlate in the right MCA territory(series 5, images 13-36), primarily involving the right temporal lobe, right frontal lobe, and right basal ganglia with lesser involvement of the right parietal lobe, which also demonstrates some punctate foci (series 5, images 33 and 36). These areas are associated with T2 hyperintense signal, likely cytotoxic edema, with mass effect on the right lateral ventricle but no significant midline shift. Redemonstrated hemorrhagic transformation in the right putamen, measuring up to 2.4 x 0.6 x 1.7 cm, unchanged from the prior exam when accounting for differences in technique, and the smaller hemorrhage in the right caudate, which measures up to 1.7 x 0.9 x 0.9 cm, also likely unchanged from the prior exam when accounting for differences in technique. No additional areas of hemorrhage. No mass, hydrocephalus, or extra-axial collection. The basal cisterns are patent. Normal craniocervical junction. Vascular: Normal arterial flow voids. Skull and upper cervical spine: Normal marrow signal. Sinuses/Orbits: Mild mucosal thickening in the ethmoid air cells. Status post bilateral lens replacements. Other: The mastoids are well aerated. IMPRESSION: 1. Large acute to subacute infarct in the right MCA territory, primarily involving the right temporal lobe, right frontal lobe, and right basal ganglia, with lesser involvement of the right parietal lobe. These areas are  associated with edema with mild mass effect on the right lateral ventricle but no evidence of midline shift or hydrocephalus. 2. Redemonstrated hemorrhagic areas in the  right putamen and caudate, which appear unchanged compared to the 07/03/2021 head CT when accounting for differences in technique. No new areas of hemorrhage. Results were communicated on 07/05/2021 at 12:23 am to provider BHAGAT via secure text paging. Electronically Signed   By: Wiliam Ke M.D.   On: 07/05/2021 00:23   DG Pelvis Portable  Result Date: 07/03/2021 CLINICAL DATA:  Altered mental status EXAM: PORTABLE PELVIS 1-2 VIEWS COMPARISON:  02/09/2020 FINDINGS: Gas tracks along the right groin region, probably from a hernia, correlate with visual inspection. Looking back at 12/30 1/21 there were loops of bowel in the right groin region reinforcing that this is probably from hernia. External suction catheter noted. No fracture identified. Scattered vascular calcifications noted. IMPRESSION: 1. Suspected right groin hernia with gas probably in bowel projecting over the right groin region. Correlate with physical exam. 2. No acute bony findings. Electronically Signed   By: Gaylyn Rong M.D.   On: 07/03/2021 13:17   DG CHEST PORT 1 VIEW  Result Date: 07/05/2021 CLINICAL DATA:  Leukocytosis. EXAM: PORTABLE CHEST 1 VIEW COMPARISON:  Chest radiograph May 25, 23. FINDINGS: Similar mild streaky left basilar opacities. No definite pleural effusions or pneumothorax. Similar enlarged cardiac silhouette. Enteric tube courses below the diaphragm with the tip outside the field of view. Contrast within bowel in the partially visualized left upper abdomen. IMPRESSION: Similar mild streaky left basilar opacities, favor atelectasis. Aspiration and/or infection is not excluded. Electronically Signed   By: Feliberto Harts M.D.   On: 07/05/2021 10:01   DG Chest Portable 1 View  Result Date: 07/03/2021 CLINICAL DATA:  Altered mental status EXAM: PORTABLE CHEST 1 VIEW COMPARISON:  None Available. FINDINGS: Normal mediastinum and cardiac silhouette. Normal pulmonary vasculature. No evidence of effusion,  infiltrate, or pneumothorax. Atherosclerotic calcification of the aorta. No acute bony abnormality. IMPRESSION: No acute cardiopulmonary process. Aortic Atherosclerosis (ICD10-I70.0). Electronically Signed   By: Genevive Bi M.D.   On: 07/03/2021 13:08   DG Abd Portable 1V  Result Date: 07/04/2021 CLINICAL DATA:  Feeding tube placement EXAM: PORTABLE ABDOMEN - 1 VIEW COMPARISON:  None Available. FINDINGS: Weighted tip enteric feeding tube projects over the gastric body. Oral contrast material present in the distal small bowel and colon. Moderate volume of formed stool. No obstruction. Caseous calcification noted along the mitral valve annulus. Cardiomegaly. IMPRESSION: The tip of the feeding tube overlies the mid body of the stomach. Electronically Signed   By: Malachy Moan M.D.   On: 07/04/2021 15:01   ECHOCARDIOGRAM COMPLETE  Result Date: 07/04/2021    ECHOCARDIOGRAM REPORT   Patient Name:   [facility] Date of Exam: 07/04/2021 Medical Rec #:  [id number]        Height:       61.0 in Accession #:    [id number]       Weight:       117.0 lb Date of Birth:  December 13, 1935       BSA:          1.504 m Patient Age:    85 years         BP:           86/68 mmHg Patient Gender: F                HR:  109 bpm. Exam Location:  Inpatient Procedure: 2D Echo, Cardiac Doppler and Color Doppler                                MODIFIED REPORT:  This report was modified by Weston Brass MD on 07/04/2021 due to conclusion                                     added.  Indications:     Stroke  History:         Patient has prior history of Echocardiogram examinations, most                  recent 04/01/2017. Arrythmias:Atrial Fibrillation,                  Signs/Symptoms:Syncope; Risk Factors:Hypertension.  Sonographer:     Rodrigo Ran RCS Referring Phys:  [phone] Angelique Blonder A WOLFE Diagnosing Phys: Weston Brass MD IMPRESSIONS  1. Left ventricular ejection fraction, by estimation, is 55% with beat to beat  variability. The left ventricle has normal function. The left ventricle has no regional wall motion abnormalities. Left ventricular diastolic parameters are indeterminate.  2. Right ventricular systolic function is mildly reduced. The right ventricular size is normal. There is normal pulmonary artery systolic pressure. The estimated right ventricular systolic pressure is 23.8 mmHg.  3. Left atrial size was moderately dilated.  4. Right atrial size was moderately dilated.  5. The mitral valve is degenerative. Trivial mitral valve regurgitation. No evidence of mitral stenosis. Severe mitral annular calcification.  6. The aortic valve is grossly normal. There is mild calcification of the aortic valve. There is mild thickening of the aortic valve. Aortic valve regurgitation is not visualized. Aortic valve sclerosis is present, with no evidence of aortic valve stenosis.  7. The inferior vena cava is normal in size with <50% respiratory variability, suggesting right atrial pressure of 8 mmHg. Conclusion(s)/Recommendation(s): No intracardiac source of embolism detected on this transthoracic study. Consider a transesophageal echocardiogram to exclude cardiac source of embolism if clinically indicated. FINDINGS  Left Ventricle: Anomalous chord inserting into basal septum. Left ventricular ejection fraction, by estimation, is 55%. The left ventricle has normal function. The left ventricle has no regional wall motion abnormalities. The left ventricular internal cavity size was normal in size. There is no left ventricular hypertrophy. Left ventricular diastolic parameters are indeterminate. Right Ventricle: The right ventricular size is normal. No increase in right ventricular wall thickness. Right ventricular systolic function is mildly reduced. There is normal pulmonary artery systolic pressure. The tricuspid regurgitant velocity is 1.99 m/s, and with an assumed right atrial pressure of 8 mmHg, the estimated right ventricular  systolic pressure is 23.8 mmHg. Left Atrium: Left atrial size was moderately dilated. Right Atrium: Right atrial size was moderately dilated. Pericardium: Trivial pericardial effusion is present. Mitral Valve: The mitral valve is degenerative in appearance. Severe mitral annular calcification. Trivial mitral valve regurgitation. No evidence of mitral valve stenosis. The mean mitral valve gradient is 1.2 mmHg with average heart rate of 136 bpm. Tricuspid Valve: The tricuspid valve is normal in structure. Tricuspid valve regurgitation is mild . No evidence of tricuspid stenosis. Aortic Valve: The aortic valve is grossly normal. There is mild calcification of the aortic valve. There is mild thickening of the aortic valve. Aortic valve regurgitation is not visualized. Aortic valve  sclerosis is present, with no evidence of aortic valve stenosis. Aortic valve mean gradient measures 2.0 mmHg. Aortic valve peak gradient measures 3.8 mmHg. Aortic valve area, by VTI measures 1.64 cm. Pulmonic Valve: The pulmonic valve was normal in structure. Pulmonic valve regurgitation is trivial. No evidence of pulmonic stenosis. Aorta: The aortic root is normal in size and structure. Venous: The inferior vena cava is normal in size with less than 50% respiratory variability, suggesting right atrial pressure of 8 mmHg. IAS/Shunts: No atrial level shunt detected by color flow Doppler.  LEFT VENTRICLE PLAX 2D LVIDd:         4.30 cm     Diastology LVIDs:         2.70 cm     LV e' lateral:   7.04 cm/s LV PW:         0.90 cm     LV E/e' lateral: 15.1 LV IVS:        1.00 cm LVOT diam:     1.70 cm LV SV:         29 LV SV Index:   19 LVOT Area:     2.27 cm  LV Volumes (MOD) LV vol d, MOD A2C: 43.8 ml LV vol d, MOD A4C: 39.7 ml LV vol s, MOD A4C: 17.1 ml LV SV MOD A4C:     39.7 ml RIGHT VENTRICLE             IVC RV Basal diam:  3.30 cm     IVC diam: 1.30 cm RV Mid diam:    2.70 cm RV S prime:     10.60 cm/s TAPSE (M-mode): 1.8 cm LEFT ATRIUM              Index        RIGHT ATRIUM           Index LA diam:        3.20 cm 2.13 cm/m   RA Area:     11.00 cm LA Vol (A2C):   24.2 ml 16.09 ml/m  RA Volume:   20.00 ml  13.30 ml/m LA Vol (A4C):   28.7 ml 19.08 ml/m LA Biplane Vol: 27.2 ml 18.09 ml/m  AORTIC VALVE                    PULMONIC VALVE AV Area (Vmax):    1.78 cm     PV Vmax:          1.16 m/s AV Area (Vmean):   1.58 cm     PV Peak grad:     5.4 mmHg AV Area (VTI):     1.64 cm     PR End Diast Vel: 3.64 msec AV Vmax:           98.00 cm/s AV Vmean:          75.200 cm/s AV VTI:            0.179 m AV Peak Grad:      3.8 mmHg AV Mean Grad:      2.0 mmHg LVOT Vmax:         76.70 cm/s LVOT Vmean:        52.500 cm/s LVOT VTI:          0.129 m LVOT/AV VTI ratio: 0.72  AORTA Ao Root diam: 2.60 cm Ao Asc diam:  3.00 cm MITRAL VALVE                TRICUSPID  VALVE MV Area (PHT): 4.21 cm     TR Peak grad:   15.8 mmHg MV Mean grad:  1.2 mmHg     TR Vmax:        199.00 cm/s MV Decel Time: 180 msec MV E velocity: 106.00 cm/s  SHUNTS                             Systemic VTI:  0.13 m                             Systemic Diam: 1.70 cm Weston Brass MD Electronically signed by Weston Brass MD Signature Date/Time: 07/04/2021/12:26:52 PM    Final (Updated)      Assessment and Plan:   Atrial fib hx of this in past and with review of EKGS recently may have been in since March.  Has not bee on anticoagulation due to syncope and falls.  Rate on amiodarone IV and dilt po will change to BB and amio  normal EF.   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  HTN though hypotensive recently and in March HLD on statin continue  Hyponatremia per primary team May have aneurysm vs infundibulum on Lt ICA.  Carotid doppler pending.    Risk Assessment/Risk Scores:          CHA2DS2-VASc Score = 6   This indicates a 9.7% annual risk of stroke. The  patient's score is based upon: CHF History: 0 HTN History: 1 Diabetes History: 0 Stroke History: 2 Vascular Disease History: 0 Age Score: 2 Gender Score: 1         For questions or updates, please contact [facility] Please consult [email or web address] for contact info under    Signed, Nada Boozer, NP  07/05/2021 1:53 PM

## 2021-07-05 NOTE — Progress Notes (Addendum)
NAME:  Gwendolyn [name], MRN:  [id number], DOB:  24-Sep-1935, LOS: 2 ADMISSION DATE:  07/03/2021, CONSULTATION DATE: 07/04/2021 REFERRING MD: Dr. Pearlean Brownie, CHIEF COMPLAINT: Left-sided weakness, now in A-fib with RVR  History of Present Illness:  86 year old female with chronic atrial fibrillation not on anticoagulation who presented with altered mental status, left facial droop and slurred speech with left-sided weakness, noted to have right basal ganglia stroke with hemorrhagic conversion, patient was admitted under stroke service, since admission patient was noted to be in A-fib with RVR heart rate ranging in 160s, she was started on IV diltiazem infusion without much improvement and episodes of hypotension, PCCM was consulted for help with evaluation and medical management  Pertinent  Medical History   Past Medical History:  Diagnosis Date   A-fib [facility])    Atrial fibrillation (HCC)    Hypertension    Loss of consciousness (HCC)    Syncope     Significant Hospital Events: Including procedures, antibiotic start and stop dates in addition to other pertinent events     Interim History / Subjective:  Patient is complaining of palpitation, denies chest pain Heart rate is better controlled now Remained on amiodarone infusion  Objective   Blood pressure (!) 97/58, pulse 92, temperature 98.1 F (36.7 C), temperature source Axillary, resp. rate 18, height 5\' 1"  (1.549 m), weight 56.5 kg, SpO2 98 %.        Intake/Output Summary (Last 24 hours) at 07/05/2021 1035 Last data filed at 07/05/2021 0900 Gross per 24 hour  Intake 2565.03 ml  Output 925 ml  Net 1640.03 ml   Filed Weights   07/03/21 1139 07/05/21 0600  Weight: 53.1 kg 56.5 kg    Examination: Physical exam: General: Acute on chronically ill-appearing female, lying on the bed HEENT: [place]/AT, eyes anicteric.  moist mucus membranes Neuro: Alert, awake following commands.  Left facial droop, left-side is weaker than right but  antigravity in all 4 extremities.  Rightward gaze deviation Chest: Coarse breath sounds, no wheezes or rhonchi Heart: Irregularly irregular, no murmurs or gallops Abdomen: Soft, nontender, nondistended, bowel sounds present Skin: No rash   Resolved Hospital Problem list     Assessment & Plan:  Chronic atrial fibrillation with rapid ventricular response Acute right MCA with hemorrhagic conversion Hypertension Hyperlipidemia Hyponatremia  Patient with chronic atrial fibrillation Heart rate is better controlled now Currently on amiodarone infusion Started on oral diltiazem 60 mg every 6 hours Echocardiogram showed normal EF with reduced right ventricular function Patient is not on anticoagulation at home, now she has hemorrhagic conversion of her right MCA stroke, will hold off on anticoagulation despite CHA2DS2-VASc score is 6 Hold antiplatelet agent Continue telemetry monitoring Hold antihypertensive meds Continue statin We will try low-dose Lasix today   Best Practice (right click and "Reselect all SmartList Selections" daily)   Diet/type: Tube feeds DVT prophylaxis: SCD GI prophylaxis: PPI Lines: N/A Foley:  N/A Code Status:  full code Last date of multidisciplinary goals of care discussion [Per primary team]  Labs   CBC: Recent Labs  Lab 07/03/21 1147 07/04/21 0040  WBC 16.7* 14.1*  NEUTROABS 14.5*  --   HGB 13.9 13.4  HCT 41.9 39.2  MCV 98.8 98.5  PLT 236 180    Basic Metabolic Panel: Recent Labs  Lab 07/03/21 1147 07/04/21 0040  NA 132* 133*  K 4.4 3.9  CL 97* 102  CO2 19* 18*  GLUCOSE 126* 113*  BUN 20 21  CREATININE 1.03* 0.85  CALCIUM 8.8* 8.3*  MG  --  1.8  PHOS  --  3.4   GFR: Estimated Creatinine Clearance: 37.8 mL/min (by C-G formula based on SCr of 0.85 mg/dL). Recent Labs  Lab 07/03/21 1147 07/03/21 1320 07/04/21 0040  WBC 16.7*  --  14.1*  LATICACIDVEN  --  1.4  --     Liver Function Tests: Recent Labs  Lab  07/03/21 1147 07/04/21 0040  AST 86* 85*  ALT 41 43  ALKPHOS 80 73  BILITOT 1.7* 1.1  PROT 7.0 6.2*  ALBUMIN 3.6 3.1*   No results for input(s): LIPASE, AMYLASE in the last 168 hours. Recent Labs  Lab 07/03/21 1148  AMMONIA 21    ABG    Component Value Date/Time   HCO3 25.8 10/26/2019 1147   TCO2 24 10/26/2019 1147   TCO2 27 10/26/2019 1147   O2SAT 75.0 10/26/2019 1147     Coagulation Profile: Recent Labs  Lab 07/03/21 1147  INR 1.2    Cardiac Enzymes: Recent Labs  Lab 07/03/21 1147 07/04/21 0040  CKTOTAL 3,851* 4,387*    HbA1C: Hgb A1c MFr Bld  Date/Time Value Ref Range Status  07/04/2021 12:40 AM 5.7 (H) 4.8 - 5.6 % Final    Comment:    (NOTE) Pre diabetes:          5.7%-6.4%  Diabetes:              >6.4%  Glycemic control for   <7.0% adults with diabetes     CBG: Recent Labs  Lab 07/03/21 1206 07/04/21 2005 07/04/21 2331 07/05/21 0328 07/05/21 0818  GLUCAP 127* 147* 142* 178* 182*    Critical care time:     Total critical care time: 32 minutes  Performed by: Cheri Fowler   Critical care time was exclusive of separately billable procedures and treating other patients.   Critical care was necessary to treat or prevent imminent or life-threatening deterioration.   Critical care was time spent personally by me on the following activities: development of treatment plan with patient and/or surrogate as well as nursing, discussions with consultants, evaluation of patient's response to treatment, examination of patient, obtaining history from patient or surrogate, ordering and performing treatments and interventions, ordering and review of laboratory studies, ordering and review of radiographic studies, pulse oximetry and re-evaluation of patient's condition.   Cheri Fowler MD [facility] See Amion for pager If no response to pager, please call [phone] until 7pm After 7pm, Please call E-link [phone]

## 2021-07-06 DIAGNOSIS — I6389 Other cerebral infarction: Secondary | ICD-10-CM | POA: Diagnosis not present

## 2021-07-06 DIAGNOSIS — I4819 Other persistent atrial fibrillation: Secondary | ICD-10-CM | POA: Diagnosis not present

## 2021-07-06 DIAGNOSIS — I4891 Unspecified atrial fibrillation: Secondary | ICD-10-CM | POA: Diagnosis not present

## 2021-07-06 LAB — CBC
HCT: 32.3 % — ABNORMAL LOW (ref 36.0–46.0)
Hemoglobin: 11.1 g/dL — ABNORMAL LOW (ref 12.0–15.0)
MCH: 33.2 pg (ref 26.0–34.0)
MCHC: 34.4 g/dL (ref 30.0–36.0)
MCV: 96.7 fL (ref 80.0–100.0)
Platelets: 177 10*3/uL (ref 150–400)
RBC: 3.34 MIL/uL — ABNORMAL LOW (ref 3.87–5.11)
RDW: 13.5 % (ref 11.5–15.5)
WBC: 8.5 10*3/uL (ref 4.0–10.5)
nRBC: 0 % (ref 0.0–0.2)

## 2021-07-06 LAB — GLUCOSE, CAPILLARY
Glucose-Capillary: 119 mg/dL — ABNORMAL HIGH (ref 70–99)
Glucose-Capillary: 120 mg/dL — ABNORMAL HIGH (ref 70–99)
Glucose-Capillary: 133 mg/dL — ABNORMAL HIGH (ref 70–99)
Glucose-Capillary: 138 mg/dL — ABNORMAL HIGH (ref 70–99)
Glucose-Capillary: 164 mg/dL — ABNORMAL HIGH (ref 70–99)
Glucose-Capillary: 167 mg/dL — ABNORMAL HIGH (ref 70–99)

## 2021-07-06 LAB — BASIC METABOLIC PANEL
Anion gap: 10 (ref 5–15)
BUN: 25 mg/dL — ABNORMAL HIGH (ref 8–23)
CO2: 21 mmol/L — ABNORMAL LOW (ref 22–32)
Calcium: 8 mg/dL — ABNORMAL LOW (ref 8.9–10.3)
Chloride: 105 mmol/L (ref 98–111)
Creatinine, Ser: 0.78 mg/dL (ref 0.44–1.00)
GFR, Estimated: >60 mL/min (ref >60)
Glucose, Bld: 140 mg/dL — ABNORMAL HIGH (ref 70–99)
Potassium: 3.7 mmol/L (ref 3.5–5.1)
Sodium: 136 mmol/L (ref 135–145)

## 2021-07-06 LAB — MAGNESIUM
Magnesium: 1.9 mg/dL (ref 1.7–2.4)
Magnesium: 1.9 mg/dL (ref 1.7–2.4)

## 2021-07-06 LAB — PHOSPHORUS
Phosphorus: 1.6 mg/dL — ABNORMAL LOW (ref 2.5–4.6)
Phosphorus: 1.8 mg/dL — ABNORMAL LOW (ref 2.5–4.6)

## 2021-07-06 MED ORDER — CHLORHEXIDINE GLUCONATE 0.12 % MT SOLN
15.0000 mL | Freq: Two times a day (BID) | OROMUCOSAL | Status: DC
  Administered 2021-07-06 – 2021-07-11 (×11): 15 mL via OROMUCOSAL
  Filled 2021-07-06 (×11): qty 15

## 2021-07-06 MED ORDER — ORAL CARE MOUTH RINSE: 15.0000 mL | Freq: Two times a day (BID) | OROMUCOSAL | Status: DC

## 2021-07-06 MED ORDER — BACLOFEN 10 MG PO TABS
10.0000 mg | ORAL_TABLET | Freq: Three times a day (TID) | ORAL | Status: DC | PRN
  Filled 2021-07-06: qty 1

## 2021-07-06 MED ORDER — AMIODARONE HCL 200 MG PO TABS
200.0000 mg | ORAL_TABLET | Freq: Two times a day (BID) | ORAL | Status: DC
  Administered 2021-07-06 – 2021-07-11 (×11): 200 mg
  Filled 2021-07-06 (×11): qty 1

## 2021-07-06 MED ORDER — SODIUM CHLORIDE 0.9 % IV BOLUS
500.0000 mL | Freq: Once | INTRAVENOUS | Status: AC
  Administered 2021-07-06: 500 mL via INTRAVENOUS

## 2021-07-06 MED ORDER — METOPROLOL TARTRATE 25 MG/10 ML ORAL SUSPENSION
25.0000 mg | Freq: Three times a day (TID) | ORAL | Status: DC
  Administered 2021-07-06 – 2021-07-09 (×10): 25 mg
  Filled 2021-07-06 (×12): qty 10

## 2021-07-06 MED ORDER — ORAL CARE MOUTH RINSE
15.0000 mL | Freq: Two times a day (BID) | OROMUCOSAL | Status: DC
  Administered 2021-07-06 – 2021-07-11 (×10): 15 mL via OROMUCOSAL

## 2021-07-06 NOTE — Progress Notes (Addendum)
STROKE TEAM PROGRESS NOTE   INTERVAL HISTORY Patient is seen in her room with no family at the bedside.  Her neurological exam is stable today.  She has transferred out of the ICU and is awaiting SNF placement.  Vitals:   07/06/21 0400 07/06/21 0500 07/06/21 0629 07/06/21 0842  BP: 108/65  112/84 116/82  Pulse: (!) 101  (!) 131 (!) 107  Resp: 19   20  Temp: 98.3 F (36.8 C)   98.2 F (36.8 C)  TempSrc: Axillary   Oral  SpO2: 98%   100%  Weight:  60.7 kg    Height:       CBC:  Recent Labs  Lab 07/03/21 1147 07/04/21 0040 07/05/21 1153 07/06/21 0506  WBC 16.7*   < > 9.9 8.5  NEUTROABS 14.5*  --   --   --   HGB 13.9   < > 11.5* 11.1*  HCT 41.9   < > 35.7* 32.3*  MCV 98.8   < > 100.6* 96.7  PLT 236   < > 161 177   < > = values in this interval not displayed.    Basic Metabolic Panel:  Recent Labs  Lab 07/05/21 1153 07/05/21 1631 07/06/21 0506  NA 135  --  136  K 3.6  --  3.7  CL 108  --  105  CO2 18*  --  21*  GLUCOSE 119*  --  140*  BUN 25*  --  25*  CREATININE 0.79  --  0.78  CALCIUM 8.1*  --  8.0*  MG 2.0 1.9 1.9  PHOS 1.4* 1.3* 1.6*    Lipid Panel:  Recent Labs  Lab 07/04/21 0040  CHOL 226*  TRIG 80  HDL 88  CHOLHDL 2.6  VLDL 16  LDLCALC 122*    HgbA1c:  Recent Labs  Lab 07/04/21 0040  HGBA1C 5.7*    Urine Drug Screen:  Recent Labs  Lab 07/03/21 1626  LABOPIA NONE DETECTED  COCAINSCRNUR NONE DETECTED  LABBENZ NONE DETECTED  AMPHETMU NONE DETECTED  THCU NONE DETECTED  LABBARB NONE DETECTED     Alcohol Level No results for input(s): ETH in the last 168 hours.  IMAGING past 24 hours No results found.  PHYSICAL EXAM General:  Alert, well-developed, well-nourished elderly Caucasian lady in no acute distress Respiratory:  Regular, unlabored respirations on room air  NEURO:  Mental Status: AA&Ox3 .  Head and gaze deviation to the right Speech/Language: speech is without dysarthria or aphasia.  Fluency, and comprehension  intact.  Cranial Nerves:  II: PERRL. Visual fields showed decreased blink to threat on the left III, IV, VI: right gaze deviation able to cross midline with effort V: Sensation is intact to light touch and symmetrical to face with extinction to DSS on left side VII: left sided facial droop  VIII: hearing intact to voice. IX, X: Phonation is normal.  XII: tongue is midline without fasciculations. Motor: 5/5 strength to RUE and RLE, 2/5 to LUE, 2/5 to LLE Sensation- Intact to light touch on right and in LLE.  Impaired on LUE Extinction present to DSS on the left  Coordination: FTN intact on right Gait- deferred   ASSESSMENT/PLAN Gwendolyn [name] is a 86 y.o. female with history of atrial fibrillation not on anticoagulation, HTN and syncope presenting with altered mental status, left sided facial droop, slurred speech and left sided weakness.  She was found to have an acute stroke in the right basal ganglia with hemorrhagic transformation.  She  was noted to be in a-fib with RVR and was initially treated with a diltiazem gtt then switched to amiodarone due to hypotension.  Stroke:  right MCA larege infarct with hemorrhagic transformation likely secondary due to embolism in setting of atrial fibrillation not on anticoagulation CT head multiple acute hemorrhagic infarct in right basal ganglia CTA head & neck distal right M1 MCA stenosis and occlusion of distal right M2 MCA MRI large right MCA infarct with HT at right BG and caudate 2D Echo EF 55%, moderately dilated left atrium, no atrial level shunt LDL 122 HgbA1c 5.7 VTE prophylaxis - heparin subcu aspirin 81 mg daily prior to admission, now on No antithrombotic due to HT Therapy recommendations:  SNF Disposition:  pending  Atrial fibrillation with RVR Patient is in a-fib RVR with rates up to the 160s Not on anticoagulation at home Cardiology on board, appreciate help Was initially treated with diltiazem, now on metoprolol 12.5  every 6h Still on IV amiodarone  Hypertension Home meds:  carvedilol 6.25 mg BID, lisinopril 40 mg daily Stable on the low end Now on metoprolol Long-term BP goal normotensive  Hyperlipidemia Home meds:  none LDL 122, goal < 70 Add atorvastatin 40 mg daily  Continue statin at discharge  Dysphagia Did not pass swallow Has core track placed On tube feeding Speech on board  Other Stroke Risk Factors Advanced Age >/= 55   Other Active Problems Syncope Leukocytosis WBC 14.1-> 9.9-> 8.5 Elevated AST 85->52  Hospital day # [place] , MSN, AGACNP-BC [facility] See Amion for schedule and pager information 07/06/2021 11:50 AM   ATTENDING NOTE: I reviewed above note and agree with the assessment and plan. Pt was seen and examined.   No family at bedside.  Patient lying in bed, initially sleeping, however he is open eyes on voice.  Able to answer questions in a delayed fashion.  Minimal dysarthria.  Still has right gaze preference, however able to cross midline today.  Overnight still have A-fib RVR, on metoprolol p.o., cardiology on board.  Switch IV amiodarone to p.o.  Increase metoprolol from 12.5 every 6 to 25 mg 3 times daily.  Pending SNF placement.  For detailed assessment and plan, please refer to above as I have made changes wherever appropriate.   [name], MD PhD Stroke Neurology 07/06/2021 5:14 PM    To contact Stroke Continuity provider, please refer to [email or web address] After hours, contact General Neurology

## 2021-07-06 NOTE — Consult Note (Addendum)
WOC Nurse Consult Note: Reason for Consult: Consulted by Bedside RN to sacral and also dorsal aspect of left foot. My associate D. Sylvie Farrier saw this patient on Friday, 07/04/21 for the sacral wound. Please see her note from that encounter. I will provide Nursing with guidance for the care of the dorsal aspect of the left foot. Wound type: full thickness Pressure Injury POA: N/A Measurement:Measurements to be obtained by Bedside RN and documented on the Nursing Flow Sheet Wound FMB:WGYK, red and yellow, dry Drainage (amount, consistency, odor) small serous to light yellow Periwound: intact  Dressing procedure/placement/frequency: I have provided Nursing with guidance for the care of the foot lesion using a NS cleanse followed by dressing with an antimicrobial nonadherent (xeroform) gauze and topping with dry gauze. Securement is with a few turns of Kerlix roll gauze/paper tape. Feet are to be placed into Pressure Redistribution heel boots (Prevalon). Turning and repositioning is in place and time in the supine position is to be minimized.  WOC nursing team will not follow, but will remain available to this patient, the nursing and medical teams.  Please re-consult if needed. Thanks, Ladona Mow, MSN, RN, GNP, [place]  Pager# [phone]

## 2021-07-06 NOTE — TOC Initial Note (Addendum)
Transition of Care [facility]) - Initial/Assessment Note    Patient Details  Name: Gwendolyn [name] MRN: [id number] Date of Birth: 20-Aug-1935  [facility] of [facility] CM/SW Contact:    Delilah Shan, LCSWA Phone Number: 07/06/2021, 11:25 AM  Clinical Narrative:                  CSW received consult for possible SNF placement at time of discharge. CSW spoke with patient regarding PT recommendation of SNF placement at time of discharge. Patient expressed understanding of PT recommendation and is agreeable to SNF placement at time of discharge. Patient reports she comes from hotel.CSW informed patient will fax out once cortrak is out. Patient gave CSW permission to fax out initial referral near the [place] area.CSW discussed insurance authorization process with patient.Patient has not received the COVID vaccines. No further questions reported at this time. CSW to continue to follow and assist with patients discharge planning needs.   Expected Discharge Plan: Skilled Nursing Facility Barriers to Discharge: Continued Medical Work up   Patient Goals and CMS Choice Patient states their goals for this hospitalization and ongoing recovery are:: SNF CMS Medicare.gov Compare Post Acute Care list provided to:: Patient Choice offered to / list presented to : Patient  Expected Discharge Plan and Services Expected Discharge Plan: Skilled Nursing Facility In-house Referral: Clinical Social Work     Living arrangements for the past 2 months: Hotel/Motel                                      Prior Living Arrangements/Services Living arrangements for the past 2 months: Hotel/Motel Lives with:: Self Patient language and need for interpreter reviewed:: Yes Do you feel safe going back to the place where you live?: No   SNF  Need for Family Participation in Patient Care: Yes (Comment) Care giver support system in place?: Yes (comment)   Criminal Activity/Legal Involvement Pertinent to  Current Situation/Hospitalization: No - Comment as needed  Activities of Daily Living Home Assistive Devices/Equipment: None ADL Screening (condition at time of admission) Patient's cognitive ability adequate to safely complete daily activities?: Yes Is the patient deaf or have difficulty hearing?: No Does the patient have difficulty seeing, even when wearing glasses/contacts?: No Does the patient have difficulty concentrating, remembering, or making decisions?: No Patient able to express need for assistance with ADLs?: Yes Does the patient have difficulty dressing or bathing?: No Independently performs ADLs?: Yes (appropriate for developmental age) Does the patient have difficulty walking or climbing stairs?: No Weakness of Legs: Left Weakness of Arms/Hands: Left  Permission Sought/Granted Permission sought to share information with : Case Manager, Family Supports, Magazine features editor Permission granted to share information with : Yes, Verbal Permission Granted  Share Information with NAME: [name]  Permission granted to share info w AGENCY: SNF  Permission granted to share info w Relationship: Niece  Permission granted to share info w Contact Information: [name]   [phone]  Emotional Assessment Appearance:: Appears stated age Attitude/Demeanor/Rapport: Gracious Affect (typically observed): Calm Orientation: : Oriented to Self, Oriented to Place, Oriented to  Time, Oriented to Situation Alcohol / Substance Use: Not Applicable Psych Involvement: No (comment)  Admission diagnosis:  Intracerebral hemorrhage (HCC) [I61.9] Altered mental status, unspecified altered mental status type [R41.82] Patient Active Problem List   Diagnosis Date Noted   Intracerebral hemorrhage (HCC) 07/03/2021   PCP:  Irena Reichmann, DO Pharmacy:   [facility]  [id number] [place] [address], [place] - [phone] AT [facility] OF [facility] & [address] [place]  [address] Phone: [phone] Fax: [phone]     Social Determinants of Health (SDOH) Interventions    Readmission Risk Interventions     View : No data to display.

## 2021-07-06 NOTE — Progress Notes (Signed)
   Patient was not in her room on rounds. Case discussed with Dr. Roda Shutters. HR remains elevated, but blood pressure is improved after changing to metoprolol. Could consider further increase in metoprolol as BP allows - will try 25 mg TID through the tube. Switch to oral amiodarone, also through the tube - 200 mg BID x 7 days, then reduce to 200 mg daily.  Chrystie Nose, MD, [facility], FACP  [place]  [facility]  Medical Director of the Advanced Lipid Disorders &  Cardiovascular Risk Reduction Clinic Diplomate of the American Board of Clinical Lipidology Attending Cardiologist  Direct Dial: [phone]  Fax: [phone]  Website:  [email or web address]

## 2021-07-07 DIAGNOSIS — R1312 Dysphagia, oropharyngeal phase: Secondary | ICD-10-CM | POA: Diagnosis not present

## 2021-07-07 DIAGNOSIS — L8990 Pressure ulcer of unspecified site, unspecified stage: Secondary | ICD-10-CM | POA: Insufficient documentation

## 2021-07-07 DIAGNOSIS — I4819 Other persistent atrial fibrillation: Secondary | ICD-10-CM

## 2021-07-07 DIAGNOSIS — I6389 Other cerebral infarction: Secondary | ICD-10-CM | POA: Diagnosis not present

## 2021-07-07 LAB — CBC
HCT: 29.6 % — ABNORMAL LOW (ref 36.0–46.0)
Hemoglobin: 10.1 g/dL — ABNORMAL LOW (ref 12.0–15.0)
MCH: 33.3 pg (ref 26.0–34.0)
MCHC: 34.1 g/dL (ref 30.0–36.0)
MCV: 97.7 fL (ref 80.0–100.0)
Platelets: 173 10*3/uL (ref 150–400)
RBC: 3.03 MIL/uL — ABNORMAL LOW (ref 3.87–5.11)
RDW: 13.7 % (ref 11.5–15.5)
WBC: 8.5 10*3/uL (ref 4.0–10.5)
nRBC: 0 % (ref 0.0–0.2)

## 2021-07-07 LAB — BASIC METABOLIC PANEL
Anion gap: 5 (ref 5–15)
BUN: 23 mg/dL (ref 8–23)
CO2: 23 mmol/L (ref 22–32)
Calcium: 7.7 mg/dL — ABNORMAL LOW (ref 8.9–10.3)
Chloride: 107 mmol/L (ref 98–111)
Creatinine, Ser: 0.67 mg/dL (ref 0.44–1.00)
GFR, Estimated: >60 mL/min (ref >60)
Glucose, Bld: 142 mg/dL — ABNORMAL HIGH (ref 70–99)
Potassium: 3.7 mmol/L (ref 3.5–5.1)
Sodium: 135 mmol/L (ref 135–145)

## 2021-07-07 LAB — PHOSPHORUS: Phosphorus: 2 mg/dL — ABNORMAL LOW (ref 2.5–4.6)

## 2021-07-07 LAB — GLUCOSE, CAPILLARY
Glucose-Capillary: 124 mg/dL — ABNORMAL HIGH (ref 70–99)
Glucose-Capillary: 140 mg/dL — ABNORMAL HIGH (ref 70–99)
Glucose-Capillary: 143 mg/dL — ABNORMAL HIGH (ref 70–99)
Glucose-Capillary: 147 mg/dL — ABNORMAL HIGH (ref 70–99)
Glucose-Capillary: 151 mg/dL — ABNORMAL HIGH (ref 70–99)
Glucose-Capillary: 168 mg/dL — ABNORMAL HIGH (ref 70–99)

## 2021-07-07 MED ORDER — SODIUM PHOSPHATES 45 MMOLE/15ML IV SOLN
15.0000 mmol | Freq: Once | INTRAVENOUS | Status: AC
  Administered 2021-07-07: 15 mmol via INTRAVENOUS
  Filled 2021-07-07: qty 5

## 2021-07-07 NOTE — Progress Notes (Signed)
DAILY PROGRESS NOTE   [name] Date of Encounter: 07/07/2021 Cardiologist: Chrystie Nose, MD  Chief Complaint   Remains in afib.  Patient Profile   Gwendolyn [name] is a 86 y.o. female with a hx of atrial fib from 03/2017 placed on Eliquis but stopped sometime thereafter and  never returned for followup, hx of syncope who is being seen 07/05/2021 after admit for AMS found to have multiple acute hemorrhages in Rt basal ganglia with associated edema without mass effect for the evaluation of atrial fib at the request of Dr Jetta Lout.  Subjective   Afib rates are decently controlled- HR generally <120. Not anticoagulated yet secondary to ICH. Statin has been started.  Objective   Vitals:   07/06/21 1513 07/06/21 1600 07/06/21 2000 07/07/21 0000  BP: 105/67 105/67 104/77 113/60  Pulse: (!) 111 (!) 120 (!) 131 (!) 108  Resp: 18 20 20 20   Temp: 98 F (36.7 C)  98.2 F (36.8 C) 98 F (36.7 C)  TempSrc: Oral  Oral Oral  SpO2: 98% 98% 98% 97%  Weight:      Height:        Intake/Output Summary (Last 24 hours) at 07/07/2021 0749 Last data filed at 07/06/2021 1800 Gross per 24 hour  Intake 987.77 ml  Output 200 ml  Net 787.77 ml   Filed Weights   07/03/21 1139 07/05/21 0600 07/06/21 0500  Weight: 53.1 kg 56.5 kg 60.7 kg    Physical Exam   General appearance: alert and no distress Lungs: clear to auscultation bilaterally Heart: irregularly irregular rhythm Extremities: extremities normal, atraumatic, no cyanosis or edema Neurologic: Mental status: left facial droop, right gaze preference  Inpatient Medications    Scheduled Meds:  amiodarone  200 mg Per Tube BID   atorvastatin  40 mg Per Tube Daily   chlorhexidine  15 mL Mouth Rinse BID   Chlorhexidine Gluconate Cloth  6 each Topical Q0600   feeding supplement (PROSource TF)  45 mL Per Tube BID   heparin injection (subcutaneous)  5,000 Units Subcutaneous Q12H   mouth rinse  15 mL Mouth Rinse q12n4p    metoprolol tartrate  25 mg Per Tube Q8H   multivitamin with minerals  1 tablet Per Tube Daily   pantoprazole sodium  40 mg Per Tube QHS   polyethylene glycol  17 g Per Tube Daily   sennosides  10 mL Per Tube BID    Continuous Infusions:  feeding supplement (OSMOLITE 1.5 CAL) 40 mL/hr at 07/06/21 0700    PRN Meds: acetaminophen **OR** acetaminophen (TYLENOL) oral liquid 160 mg/5 mL **OR** acetaminophen, baclofen, labetalol   Labs   Results for orders placed or performed during the hospital encounter of 07/03/21 (from the past 48 hour(s))  Glucose, capillary     Status: Abnormal   Collection Time: 07/05/21  8:18 AM  Result Value Ref Range   Glucose-Capillary 182 (H) 70 - 99 mg/dL    Comment: Glucose reference range applies only to samples taken after fasting for at least 8 hours.  Magnesium     Status: None   Collection Time: 07/05/21 11:53 AM  Result Value Ref Range   Magnesium 2.0 1.7 - 2.4 mg/dL    Comment: Performed at [facility], [address] [place], [place]  Phosphorus     Status: Abnormal   Collection Time: 07/05/21 11:53 AM  Result Value Ref Range   Phosphorus 1.4 (L) 2.5 - 4.6 mg/dL  Comment: Performed at [facility], [address], [place], [place] [address]  Basic metabolic panel     Status: Abnormal   Collection Time: 07/05/21 11:53 AM  Result Value Ref Range   Sodium 135 135 - 145 mmol/L   Potassium 3.6 3.5 - 5.1 mmol/L   Chloride 108 98 - 111 mmol/L   CO2 18 (L) 22 - 32 mmol/L   Glucose, Bld 119 (H) 70 - 99 mg/dL    Comment: Glucose reference range applies only to samples taken after fasting for at least 8 hours.   BUN 25 (H) 8 - 23 mg/dL   Creatinine, Ser 6.76 0.44 - 1.00 mg/dL   Calcium 8.1 (L) 8.9 - 10.3 mg/dL   GFR, Estimated >19 >50 mL/min    Comment: (NOTE) Calculated using the CKD-EPI Creatinine Equation (2021)    Anion gap 9 5 - 15    Comment: Performed at [facility], [address], [place], [place]  [address]  CBC     Status: Abnormal   Collection Time: 07/05/21 11:53 AM  Result Value Ref Range   WBC 9.9 4.0 - 10.5 K/uL   RBC 3.55 (L) 3.87 - 5.11 MIL/uL   Hemoglobin 11.5 (L) 12.0 - 15.0 g/dL   HCT 12.4 (L) 58.0 - 99.8 %   MCV 100.6 (H) 80.0 - 100.0 fL   MCH 32.4 26.0 - 34.0 pg   MCHC 32.2 30.0 - 36.0 g/dL   RDW 33.8 25.0 - 53.9 %   Platelets 161 150 - 400 K/uL   nRBC 0.0 0.0 - 0.2 %    Comment: Performed at [facility], [address] [place], [place] [address]  Hepatic function panel     Status: Abnormal   Collection Time: 07/05/21 11:53 AM  Result Value Ref Range   Total Protein 5.3 (L) 6.5 - 8.1 g/dL   Albumin 2.5 (L) 3.5 - 5.0 g/dL   AST 52 (H) 15 - 41 U/L   ALT 35 0 - 44 U/L   Alkaline Phosphatase 58 38 - 126 U/L   Total Bilirubin 0.2 (L) 0.3 - 1.2 mg/dL   Bilirubin, Direct 0.2 0.0 - 0.2 mg/dL   Indirect Bilirubin 0.0 (L) 0.3 - 0.9 mg/dL    Comment: Performed at [facility], [address], [place], [place] [address]  Glucose, capillary     Status: Abnormal   Collection Time: 07/05/21 12:02 PM  Result Value Ref Range   Glucose-Capillary 145 (H) 70 - 99 mg/dL    Comment: Glucose reference range applies only to samples taken after fasting for at least 8 hours.  Glucose, capillary     Status: Abnormal   Collection Time: 07/05/21  4:12 PM  Result Value Ref Range   Glucose-Capillary 153 (H) 70 - 99 mg/dL    Comment: Glucose reference range applies only to samples taken after fasting for at least 8 hours.  Magnesium     Status: None   Collection Time: 07/05/21  4:31 PM  Result Value Ref Range   Magnesium 1.9 1.7 - 2.4 mg/dL    Comment: Performed at [facility], [address] [place], [place] [address]  Phosphorus     Status: Abnormal   Collection Time: 07/05/21  4:31 PM  Result Value Ref Range   Phosphorus 1.3 (L) 2.5 - 4.6 mg/dL    Comment: Performed at [facility], [address], [place], [place] [address]  Glucose, capillary  Status: Abnormal   Collection Time: 07/05/21  8:31 PM  Result Value Ref Range   Glucose-Capillary 140 (H) 70 - 99 mg/dL    Comment: Glucose reference range applies only to samples taken after fasting for at least 8 hours.  Glucose, capillary     Status: Abnormal   Collection Time: 07/06/21 12:14 AM  Result Value Ref Range   Glucose-Capillary 119 (H) 70 - 99 mg/dL    Comment: Glucose reference range applies only to samples taken after fasting for at least 8 hours.  Glucose, capillary     Status: Abnormal   Collection Time: 07/06/21  3:15 AM  Result Value Ref Range   Glucose-Capillary 164 (H) 70 - 99 mg/dL    Comment: Glucose reference range applies only to samples taken after fasting for at least 8 hours.  Magnesium     Status: None   Collection Time: 07/06/21  5:06 AM  Result Value Ref Range   Magnesium 1.9 1.7 - 2.4 mg/dL    Comment: Performed at [facility], [address], [place], [place] [address]  Phosphorus     Status: Abnormal   Collection Time: 07/06/21  5:06 AM  Result Value Ref Range   Phosphorus 1.6 (L) 2.5 - 4.6 mg/dL    Comment: Performed at [facility], [address] [place], [place] [address]  Basic metabolic panel     Status: Abnormal   Collection Time: 07/06/21  5:06 AM  Result Value Ref Range   Sodium 136 135 - 145 mmol/L   Potassium 3.7 3.5 - 5.1 mmol/L   Chloride 105 98 - 111 mmol/L   CO2 21 (L) 22 - 32 mmol/L   Glucose, Bld 140 (H) 70 - 99 mg/dL    Comment: Glucose reference range applies only to samples taken after fasting for at least 8 hours.   BUN 25 (H) 8 - 23 mg/dL   Creatinine, Ser 8.31 0.44 - 1.00 mg/dL   Calcium 8.0 (L) 8.9 - 10.3 mg/dL   GFR, Estimated >51 >76 mL/min    Comment: (NOTE) Calculated using the CKD-EPI Creatinine Equation (2021)    Anion gap 10 5 - 15    Comment: Performed at [facility], [address], [place], [place] [address]  CBC     Status: Abnormal   Collection Time: 07/06/21  5:06 AM  Result Value  Ref Range   WBC 8.5 4.0 - 10.5 K/uL   RBC 3.34 (L) 3.87 - 5.11 MIL/uL   Hemoglobin 11.1 (L) 12.0 - 15.0 g/dL   HCT 71.0 (L) 62.6 - 94.8 %   MCV 96.7 80.0 - 100.0 fL   MCH 33.2 26.0 - 34.0 pg   MCHC 34.4 30.0 - 36.0 g/dL   RDW 54.6 27.0 - 35.0 %   Platelets 177 150 - 400 K/uL   nRBC 0.0 0.0 - 0.2 %    Comment: Performed at [facility], [address] [place], [place] [address]  Glucose, capillary     Status: Abnormal   Collection Time: 07/06/21  8:47 AM  Result Value Ref Range   Glucose-Capillary 167 (H) 70 - 99 mg/dL    Comment: Glucose reference range applies only to samples taken after fasting for at least 8 hours.  Glucose, capillary     Status: Abnormal   Collection Time: 07/06/21 12:09 PM  Result Value Ref Range   Glucose-Capillary 138 (H) 70 - 99 mg/dL    Comment: Glucose reference  range applies only to samples taken after fasting for at least 8 hours.  Glucose, capillary     Status: Abnormal   Collection Time: 07/06/21  4:06 PM  Result Value Ref Range   Glucose-Capillary 133 (H) 70 - 99 mg/dL    Comment: Glucose reference range applies only to samples taken after fasting for at least 8 hours.  Magnesium     Status: None   Collection Time: 07/06/21  4:27 PM  Result Value Ref Range   Magnesium 1.9 1.7 - 2.4 mg/dL    Comment: Performed at [facility], [address] [place], [place] [address]  Phosphorus     Status: Abnormal   Collection Time: 07/06/21  4:27 PM  Result Value Ref Range   Phosphorus 1.8 (L) 2.5 - 4.6 mg/dL    Comment: Performed at [facility], [address], [place], [place] [address]  Glucose, capillary     Status: Abnormal   Collection Time: 07/06/21  8:09 PM  Result Value Ref Range   Glucose-Capillary 120 (H) 70 - 99 mg/dL    Comment: Glucose reference range applies only to samples taken after fasting for at least 8 hours.  Glucose, capillary     Status: Abnormal   Collection Time: 07/07/21 12:08 AM  Result Value Ref Range    Glucose-Capillary 143 (H) 70 - 99 mg/dL    Comment: Glucose reference range applies only to samples taken after fasting for at least 8 hours.  Basic metabolic panel     Status: Abnormal   Collection Time: 07/07/21  1:17 AM  Result Value Ref Range   Sodium 135 135 - 145 mmol/L   Potassium 3.7 3.5 - 5.1 mmol/L   Chloride 107 98 - 111 mmol/L   CO2 23 22 - 32 mmol/L   Glucose, Bld 142 (H) 70 - 99 mg/dL    Comment: Glucose reference range applies only to samples taken after fasting for at least 8 hours.   BUN 23 8 - 23 mg/dL   Creatinine, Ser 0.98 0.44 - 1.00 mg/dL   Calcium 7.7 (L) 8.9 - 10.3 mg/dL   GFR, Estimated >11 >91 mL/min    Comment: (NOTE) Calculated using the CKD-EPI Creatinine Equation (2021)    Anion gap 5 5 - 15    Comment: Performed at [facility], [address] [place], [place] [address]  CBC     Status: Abnormal   Collection Time: 07/07/21  1:17 AM  Result Value Ref Range   WBC 8.5 4.0 - 10.5 K/uL   RBC 3.03 (L) 3.87 - 5.11 MIL/uL   Hemoglobin 10.1 (L) 12.0 - 15.0 g/dL   HCT 56.2 (L) 13.0 - 86.5 %   MCV 97.7 80.0 - 100.0 fL   MCH 33.3 26.0 - 34.0 pg   MCHC 34.1 30.0 - 36.0 g/dL   RDW 78.4 69.6 - 29.5 %   Platelets 173 150 - 400 K/uL   nRBC 0.0 0.0 - 0.2 %    Comment: Performed at [facility], [address] [place], [place] [address]  Glucose, capillary     Status: Abnormal   Collection Time: 07/07/21  4:27 AM  Result Value Ref Range   Glucose-Capillary 124 (H) 70 - 99 mg/dL    Comment: Glucose reference range applies only to samples taken after fasting for at least 8 hours.    ECG   N/A  Telemetry   Afib with RVR - Personally Reviewed  Radiology    No results found.  Cardiac Studies   LVEF 55% by echo  Assessment   Principal Problem:   Intracerebral hemorrhage (HCC) Active Problems:   Persistent atrial fibrillation (HCC)   Plan   Reasonable rate control for afib - blood pressure stable and well-controlled. Continue  current med dosing and taper amiodarone as described after 1 week of therapy. Restart anticoagulation when safe per neurology.  [facility] will sign off.   Medication Recommendations:  none Other recommendations (labs, testing, etc):  none Follow up as an outpatient:  Dr. Rennis Golden or APP after discharge   Time Spent Directly with Patient:  I have spent a total of 25 minutes with the patient reviewing hospital notes, telemetry, EKGs, labs and examining the patient as well as establishing an assessment and plan that was discussed personally with the patient.  > 50% of time was spent in direct patient care.  Length of Stay:  LOS: 4 days   Chrystie Nose, MD, [facility], FACP  [place]  [facility]  Medical Director of the Advanced Lipid Disorders &  Cardiovascular Risk Reduction Clinic Diplomate of the American Board of Clinical Lipidology Attending Cardiologist  Direct Dial: [phone]  Fax: [phone]  Website:  [email or web address] 07/07/2021, 7:49 AM

## 2021-07-07 NOTE — Progress Notes (Signed)
PIV consult: Edema and erythema noted at R AC/ forearm. Assessed with [place], only vein found is midline appropriate (too deep for PIV). Discussed with Ana, RN: no current IV meds. Will wait for IV med need to start new site. This will allow R AC site to heal first. (L arm weakness)

## 2021-07-07 NOTE — Care Management Important Message (Signed)
Important Message  Patient Details  Name: Gwendolyn [name] MRN: [id number] Date of Birth: 20-Feb-1935   Medicare Important Message Given:  Yes     Josefine Fuhr Stefan Church 07/07/2021, 2:32 PM

## 2021-07-07 NOTE — Progress Notes (Signed)
Speech Language Pathology Treatment: Dysphagia  Patient Details Name: Gwendolyn [name] MRN: [id number] DOB: 03-25-1935 Today's Date: 07/07/2021 Time: 1340-1400 SLP Time Calculation (min) (ACUTE ONLY): 20 min  Assessment / Plan / Recommendation Clinical Impression  Patient seen by SLP for skilled treatment session focused on dysphagia goals. When SLP entered room, lying in bed with head resting on right shoulder. SLP was able to reposition patient minimally and she did not demonstrate ability to move head without max-total assist and patient then patient immediately returning to right lean head position. SLP performed oral care via suction and she did follow basic level commands during this. SLP then observed patient with trial of honey thick liquids via spoon sips. Initially, patient demonstrated some active participation with this opening mouth and actively accepting liquids from spoon but she soon stopped demonstrating awareness and/or attention to spoon sips of honey thick liquids presented to her lips, requiring mod-max cues to actively participate. She did exhibit two instances of delayed swallow initiation but following this, patient was not observed to orally manipulate liquid bolus and no swallow initiation observed. Patient aware of being in the hospital and having had a stroke but in the context of "they told me I'm at [facility] hospital" and "they told me I had a stroke". She does not demonstrate any awareness to her deficits, continues with poor attention and initiation and continues to be at a high aspiration risk. SLP is recommending to continue NPO status but allow trials of honey thick liquids,, puree solids with SLP only. SLP was contacted via [place] chat from an NP regarding this patient and possible need for PEG; SLP in agreement that patient would likely benefit from PEG as progress with her ability to safely take PO's will be slow. SLP will continue to follow patient for dysphagia and  cognitive treatment.   HPI HPI: Patient is an 86 y.o. female with PMH: a fib not on anticougulation, HTN, syncope. She lives in a hotel and presented to [facility] ED after being found by hotel staff with [place]. Per report, last known well was two days prior. She presented to ED with left side weakness, left side facial droop and slurred speech. CT head showed acute right basal ganglia hemorrhage. She failed the [place] swallow screen with nursing and has been NPO. MBS on 5/26 recommending continue NPO but trials of honey thick liquids and puree solids with SLP.      SLP Plan  Continue with current plan of care      Recommendations for follow up therapy are one component of a multi-disciplinary discharge planning process, led by the attending physician.  Recommendations may be updated based on patient status, additional functional criteria and insurance authorization.    Recommendations  Diet recommendations: NPO Medication Administration: Crushed with puree                Oral Care Recommendations: Oral care QID;Staff/trained caregiver to provide oral care Follow Up Recommendations: Skilled nursing-short term rehab (<3 hours/day) Assistance recommended at discharge: Frequent or constant Supervision/Assistance SLP Visit Diagnosis: Dysphagia, oropharyngeal phase (R13.12) Plan: Continue with current plan of care       Sonia Baller, MA, CCC-SLP Speech Therapy

## 2021-07-07 NOTE — Progress Notes (Addendum)
STROKE TEAM PROGRESS NOTE   INTERVAL HISTORY Patient is seen in her room with no family at the bedside.  Her neurological exam is stable today.  Per SLP, it will be some time before she is able to take PO foods and fluids safely, so PEG tube will need to be inserted.  Will consult trauma surgery.  Vitals:   07/06/21 2000 07/07/21 0000 07/07/21 0759 07/07/21 1159  BP: 104/77 113/60 126/82 129/88  Pulse: (!) 131 (!) 108 (!) 120 (!) 119  Resp: 20 20 (!) 22 (!) 22  Temp: 98.2 F (36.8 C) 98 F (36.7 C) 99.1 F (37.3 C) 98.6 F (37 C)  TempSrc: Oral Oral Oral Oral  SpO2: 98% 97% 96% 92%  Weight:      Height:       CBC:  Recent Labs  Lab 07/03/21 1147 07/04/21 0040 07/06/21 0506 07/07/21 0117  WBC 16.7*   < > 8.5 8.5  NEUTROABS 14.5*  --   --   --   HGB 13.9   < > 11.1* 10.1*  HCT 41.9   < > 32.3* 29.6*  MCV 98.8   < > 96.7 97.7  PLT 236   < > 177 173   < > = values in this interval not displayed.    Basic Metabolic Panel:  Recent Labs  Lab 07/06/21 0506 07/06/21 1627 07/07/21 0117  NA 136  --  135  K 3.7  --  3.7  CL 105  --  107  CO2 21*  --  23  GLUCOSE 140*  --  142*  BUN 25*  --  23  CREATININE 0.78  --  0.67  CALCIUM 8.0*  --  7.7*  MG 1.9 1.9  --   PHOS 1.6* 1.8*  --     Lipid Panel:  Recent Labs  Lab 07/04/21 0040  CHOL 226*  TRIG 80  HDL 88  CHOLHDL 2.6  VLDL 16  LDLCALC 122*    HgbA1c:  Recent Labs  Lab 07/04/21 0040  HGBA1C 5.7*    Urine Drug Screen:  Recent Labs  Lab 07/03/21 1626  LABOPIA NONE DETECTED  COCAINSCRNUR NONE DETECTED  LABBENZ NONE DETECTED  AMPHETMU NONE DETECTED  THCU NONE DETECTED  LABBARB NONE DETECTED     Alcohol Level No results for input(s): ETH in the last 168 hours.  IMAGING past 24 hours No results found.  PHYSICAL EXAM General:  Alert, well-developed, well-nourished elderly Caucasian lady in no acute distress Respiratory:  Regular, unlabored respirations on room air  NEURO:  Mental Status:  AA&Ox3 .  Head and gaze deviation to the right Speech/Language: speech is without dysarthria or aphasia.  Fluency, and comprehension intact.  Cranial Nerves:  II: PERRL. Visual fields showed decreased blink to threat on the left III, IV, VI: right gaze deviation able to cross midline with effort V: Sensation is intact to light touch and symmetrical to face with extinction to DSS on left side VII: left sided facial droop  VIII: hearing intact to voice. IX, X: Phonation is normal.  XII: tongue is midline without fasciculations. Motor: 5/5 strength to RUE and RLE, 2/5 to LUE, 2/5 to LLE Sensation- Intact to light touch on right and in LLE.  Impaired on LUE Extinction present to DSS on the left  Coordination: FTN intact on right Gait- deferred   ASSESSMENT/PLAN Gwendolyn [name] is a 86 y.o. female with history of atrial fibrillation not on anticoagulation, HTN and syncope presenting with altered  mental status, left sided facial droop, slurred speech and left sided weakness.  She was found to have an acute stroke in the right basal ganglia with hemorrhagic transformation.  She was noted to be in a-fib with RVR and was initially treated with a diltiazem gtt then switched to amiodarone due to hypotension.  Stroke:  right MCA large infarct with hemorrhagic transformation likely secondary due to embolism in setting of atrial fibrillation not on anticoagulation CT head multiple acute hemorrhagic infarct in right basal ganglia CTA head & neck distal right M1 MCA stenosis and occlusion of distal right M2 MCA MRI large right MCA infarct with HT at right BG and caudate CT head pending in am 2D Echo EF 55%, moderately dilated left atrium, no atrial level shunt LDL 122 HgbA1c 5.7 VTE prophylaxis - heparin subcu aspirin 81 mg daily prior to admission, now on No antithrombotic due to HT.  Will decide after CT repeat Therapy recommendations:  SNF Disposition:  pending  Atrial fibrillation with  RVR Patient is in a-fib RVR with rates up to the 160s Not on anticoagulation at home Cardiology on board, appreciate help Was initially treated with diltiazem, now on metoprolol 25 mg every 8h IV amiodarone switched to p.o.  Hypertension Home meds:  carvedilol 6.25 mg BID, lisinopril 40 mg daily Stable on the low end Now on metoprolol Long-term BP goal normotensive  Hyperlipidemia Home meds:  none LDL 122, goal < 70 Add atorvastatin 40 mg daily  Continue statin at discharge  Dysphagia Did not pass swallow Has core track placed On tube feeding Speech on board, agree with PEG as it will be some time before she can swallow safely  Consult trauma surgery for PEG placement   Other Stroke Risk Factors Advanced Age >/= 56   Other Active Problems Syncope Leukocytosis WBC 14.1-> 9.9-> 8.5 Elevated AST 85->52  Hospital day # [place] , MSN, AGACNP-BC [facility] See Amion for schedule and pager information 07/07/2021 1:59 PM   ATTENDING NOTE: I reviewed above note and agree with the assessment and plan. Pt was seen and examined.   No acute event overnight, patient lying in bed, still has A-fib RVR, cardiology on board, currently on metoprolol 25 every 8 hours.  BP stable than before.  Patient still has right gaze preference however able to cross midline.  Left facial droop and left hemiplegia.  Still has dysphagia on core track tube feeding.  Discussed with speech therapist, consider PEG tube placement will be appropriate for SNF placement.,  Surgery consulted for PEG tube placement.  Repeat CT in a.m. to decide on initiation of antithrombotics.  For detailed assessment and plan, please refer to above as I have made changes wherever appropriate.   [name], MD PhD Stroke Neurology 07/07/2021 6:24 PM    To contact Stroke Continuity provider, please refer to [email or web address] After hours, contact General Neurology

## 2021-07-07 NOTE — Consult Note (Signed)
Reason for Consult/Chief Complaint: dysphagia, PEG Consultant: Roda Shutters, MD  Gwendolyn [name] is an 86 y.o. female.   HPI: 89F with AF not on [facility] s/p R MCA stroke. Some hemorrhagic conversion, so still not on AC. Post-stroke dysphagia, eval by SLP earlier today, continued recs for NPO.   Past Medical History:  Diagnosis Date   A-fib [facility])    Atrial fibrillation (HCC)    Hypertension    Loss of consciousness (HCC)    Syncope     Past Surgical History:  Procedure Laterality Date   BARTHOLIN GLAND CYST EXCISION      Family History  Problem Relation Age of Onset   Parkinson's disease Mother    Lung cancer Father    Alzheimer's disease Sister    Cirrhosis Brother    Diabetes Sister    Arthritis Sister     Social History:  reports that she has never smoked. She has never used smokeless tobacco. She reports current alcohol use. She reports that she does not use drugs.  Allergies: No Known Allergies  Medications: I have reviewed the patient's current medications.  Results for orders placed or performed during the hospital encounter of 07/03/21 (from the past 48 hour(s))  Glucose, capillary     Status: Abnormal   Collection Time: 07/05/21  8:31 PM  Result Value Ref Range   Glucose-Capillary 140 (H) 70 - 99 mg/dL    Comment: Glucose reference range applies only to samples taken after fasting for at least 8 hours.  Glucose, capillary     Status: Abnormal   Collection Time: 07/06/21 12:14 AM  Result Value Ref Range   Glucose-Capillary 119 (H) 70 - 99 mg/dL    Comment: Glucose reference range applies only to samples taken after fasting for at least 8 hours.  Glucose, capillary     Status: Abnormal   Collection Time: 07/06/21  3:15 AM  Result Value Ref Range   Glucose-Capillary 164 (H) 70 - 99 mg/dL    Comment: Glucose reference range applies only to samples taken after fasting for at least 8 hours.  Magnesium     Status: None   Collection Time: 07/06/21  5:06 AM  Result Value  Ref Range   Magnesium 1.9 1.7 - 2.4 mg/dL    Comment: Performed at [facility], [address], [place], [place] [address]  Phosphorus     Status: Abnormal   Collection Time: 07/06/21  5:06 AM  Result Value Ref Range   Phosphorus 1.6 (L) 2.5 - 4.6 mg/dL    Comment: Performed at [facility], [address], [place], [place] [address]  Basic metabolic panel     Status: Abnormal   Collection Time: 07/06/21  5:06 AM  Result Value Ref Range   Sodium 136 135 - 145 mmol/L   Potassium 3.7 3.5 - 5.1 mmol/L   Chloride 105 98 - 111 mmol/L   CO2 21 (L) 22 - 32 mmol/L   Glucose, Bld 140 (H) 70 - 99 mg/dL    Comment: Glucose reference range applies only to samples taken after fasting for at least 8 hours.   BUN 25 (H) 8 - 23 mg/dL   Creatinine, Ser 3.87 0.44 - 1.00 mg/dL   Calcium 8.0 (L) 8.9 - 10.3 mg/dL   GFR, Estimated >56 >43 mL/min    Comment: (NOTE) Calculated using the CKD-EPI Creatinine Equation (2021)    Anion gap 10 5 - 15    Comment: Performed at [facility], [place] [address] [place], [place] [address]  CBC     Status: Abnormal   Collection Time: 07/06/21  5:06 AM  Result Value Ref Range   WBC 8.5 4.0 - 10.5 K/uL   RBC 3.34 (L) 3.87 - 5.11 MIL/uL   Hemoglobin 11.1 (L) 12.0 - 15.0 g/dL   HCT 32.3 (L) 36.0 - 46.0 %   MCV 96.7 80.0 - 100.0 fL   MCH 33.2 26.0 - 34.0 pg   MCHC 34.4 30.0 - 36.0 g/dL   RDW 13.5 11.5 - 15.5 %   Platelets 177 150 - 400 K/uL   nRBC 0.0 0.0 - 0.2 %    Comment: Performed at [facility], [place] [address] [place], [place] [address]  Glucose, capillary     Status: Abnormal   Collection Time: 07/06/21  8:47 AM  Result Value Ref Range   Glucose-Capillary 167 (H) 70 - 99 mg/dL    Comment: Glucose reference range applies only to samples taken after fasting for at least 8 hours.  Glucose, capillary     Status: Abnormal   Collection Time: 07/06/21 12:09 PM  Result Value Ref Range   Glucose-Capillary 138 (H) 70 - 99 mg/dL    Comment:  Glucose reference range applies only to samples taken after fasting for at least 8 hours.  Glucose, capillary     Status: Abnormal   Collection Time: 07/06/21  4:06 PM  Result Value Ref Range   Glucose-Capillary 133 (H) 70 - 99 mg/dL    Comment: Glucose reference range applies only to samples taken after fasting for at least 8 hours.  Magnesium     Status: None   Collection Time: 07/06/21  4:27 PM  Result Value Ref Range   Magnesium 1.9 1.7 - 2.4 mg/dL    Comment: Performed at [facility], [place] [address], [place], [place] [address]  Phosphorus     Status: Abnormal   Collection Time: 07/06/21  4:27 PM  Result Value Ref Range   Phosphorus 1.8 (L) 2.5 - 4.6 mg/dL    Comment: Performed at [place] [address], [place], [place] [address]  Glucose, capillary     Status: Abnormal   Collection Time: 07/06/21  8:09 PM  Result Value Ref Range   Glucose-Capillary 120 (H) 70 - 99 mg/dL    Comment: Glucose reference range applies only to samples taken after fasting for at least 8 hours.  Glucose, capillary     Status: Abnormal   Collection Time: 07/07/21 12:08 AM  Result Value Ref Range   Glucose-Capillary 143 (H) 70 - 99 mg/dL    Comment: Glucose reference range applies only to samples taken after fasting for at least 8 hours.  Basic metabolic panel     Status: Abnormal   Collection Time: 07/07/21  1:17 AM  Result Value Ref Range   Sodium 135 135 - 145 mmol/L   Potassium 3.7 3.5 - 5.1 mmol/L   Chloride 107 98 - 111 mmol/L   CO2 23 22 - 32 mmol/L   Glucose, Bld 142 (H) 70 - 99 mg/dL    Comment: Glucose reference range applies only to samples taken after fasting for at least 8 hours.   BUN 23 8 - 23 mg/dL   Creatinine, Ser 0.67 0.44 - 1.00 mg/dL   Calcium 7.7 (L) 8.9 - 10.3 mg/dL   GFR, Estimated >60 >60 mL/min    Comment: (NOTE) Calculated using the CKD-EPI Creatinine  Equation (2021)    Anion gap 5 5 - 15    Comment: Performed at [facility], [place] [address], [place], [place] [address]  CBC     Status: Abnormal   Collection Time: 07/07/21  1:17 AM  Result Value Ref Range   WBC 8.5 4.0 - 10.5 K/uL   RBC 3.03 (L) 3.87 - 5.11 MIL/uL   Hemoglobin 10.1 (L) 12.0 - 15.0 g/dL   HCT 29.6 (L) 36.0 - 46.0 %   MCV 97.7 80.0 - 100.0 fL   MCH 33.3 26.0 - 34.0 pg   MCHC 34.1 30.0 - 36.0 g/dL   RDW 13.7 11.5 - 15.5 %   Platelets 173 150 - 400 K/uL   nRBC 0.0 0.0 - 0.2 %    Comment: Performed at [facility], [place] [address], [place],  [address]  Phosphorus     Status: Abnormal   Collection Time: 07/07/21  1:17 AM  Result Value Ref Range   Phosphorus 2.0 (L) 2.5 - 4.6 mg/dL    Comment: Performed at [place] [address] [place], [place] [address]  Glucose, capillary     Status: Abnormal   Collection Time: 07/07/21  4:27 AM  Result Value Ref Range   Glucose-Capillary 124 (H) 70 - 99 mg/dL    Comment: Glucose reference range applies only to samples taken after fasting for at least 8 hours.  Glucose, capillary     Status: Abnormal   Collection Time: 07/07/21  8:00 AM  Result Value Ref Range   Glucose-Capillary 140 (H) 70 - 99 mg/dL    Comment: Glucose reference range applies only to samples taken after fasting for at least 8 hours.  Glucose, capillary     Status: Abnormal   Collection Time: 07/07/21 12:22 PM  Result Value Ref Range   Glucose-Capillary 151 (H) 70 - 99 mg/dL    Comment: Glucose reference range applies only to samples taken after fasting for at least 8 hours.  Glucose, capillary     Status: Abnormal   Collection Time: 07/07/21  4:14 PM  Result Value Ref Range   Glucose-Capillary 168 (H) 70 - 99 mg/dL    Comment: Glucose reference range applies only to samples taken after fasting for at least 8 hours.    No results found.  ROS 10 point review of systems is negative except as listed above in HPI.   Physical Exam Blood pressure 132/78, pulse (!) 122, temperature 99.3 F (37.4 C), temperature source Oral,  resp. rate (!) 22, height 5\' 1"  (1.549 m), weight 60.7 kg, SpO2 92 %. Constitutional: well-developed, well-nourished HEENT: pupils equal, round, reactive to light, 49mm b/l, moist conjunctiva, external inspection of ears and nose normal, hearing intact Oropharynx: normal oropharyngeal mucosa, poor dentition Neck: no thyromegaly, trachea midline, no midline cervical tenderness to palpation Chest: breath sounds equal bilaterally, normal respiratory effort, no midline or lateral chest wall tenderness to palpation/deformity Abdomen: soft, NT, no bruising, no hepatosplenomegaly GU: normal female genitalia  Back: no wounds, no thoracic/lumbar spine tenderness to palpation, no thoracic/lumbar spine stepoffs Rectal: deferred Extremities: 2+ radial and pedal pulses bilaterally, unable to assess motor and sensation bilateral UE and LE, no peripheral edema MSK: unable to assess gait/station, no clubbing/cyanosis of fingers/toes, unable to assess ROM of all four extremities Skin: warm, dry, no rashes Psych:  oriented to self only, actively having visual hallucinations on exam for me      Assessment/Plan: 70F with h/o  stroke. Recommend PEG. [place] unable to provide informed consent, will need to obtain from family as patient is not oriented to situation, date, or place ("I'm at [employer]) and is having visual hallucinations Gwendolyn [name] is in the corner right there"). When telling patient that I would have to get consent from family, she inquired if I would need to call Gwendolyn [name]. If able to get consent from family, will perform PEG 5/30 pending availability in Endo suite. Hold TF at MN in anticipation.   Jesusita Oka, MD General and [facility]

## 2021-07-08 ENCOUNTER — Inpatient Hospital Stay (HOSPITAL_COMMUNITY): Payer: Medicare Other | Admitting: Anesthesiology

## 2021-07-08 ENCOUNTER — Encounter (HOSPITAL_COMMUNITY): Payer: Self-pay | Admitting: Neurology

## 2021-07-08 ENCOUNTER — Inpatient Hospital Stay (HOSPITAL_COMMUNITY): Payer: Medicare Other

## 2021-07-08 ENCOUNTER — Encounter (HOSPITAL_COMMUNITY)
Admission: EM | Disposition: A | Discharge status: Skilled Nursing Facility | Payer: Self-pay | Source: Home / Self Care | Attending: Neurology

## 2021-07-08 DIAGNOSIS — R1312 Dysphagia, oropharyngeal phase: Secondary | ICD-10-CM | POA: Diagnosis not present

## 2021-07-08 DIAGNOSIS — I4891 Unspecified atrial fibrillation: Secondary | ICD-10-CM | POA: Diagnosis not present

## 2021-07-08 DIAGNOSIS — I6389 Other cerebral infarction: Secondary | ICD-10-CM | POA: Diagnosis not present

## 2021-07-08 DIAGNOSIS — I10 Essential (primary) hypertension: Secondary | ICD-10-CM

## 2021-07-08 DIAGNOSIS — I69398 Other sequelae of cerebral infarction: Secondary | ICD-10-CM

## 2021-07-08 DIAGNOSIS — Z431 Encounter for attention to gastrostomy: Secondary | ICD-10-CM

## 2021-07-08 HISTORY — PX: PEG PLACEMENT: SHX5437

## 2021-07-08 HISTORY — PX: ESOPHAGOGASTRODUODENOSCOPY (EGD) WITH PROPOFOL: SHX5813

## 2021-07-08 LAB — BASIC METABOLIC PANEL
Anion gap: 6 (ref 5–15)
BUN: 23 mg/dL (ref 8–23)
CO2: 23 mmol/L (ref 22–32)
Calcium: 8 mg/dL — ABNORMAL LOW (ref 8.9–10.3)
Chloride: 105 mmol/L (ref 98–111)
Creatinine, Ser: 0.71 mg/dL (ref 0.44–1.00)
GFR, Estimated: >60 mL/min (ref >60)
Glucose, Bld: 140 mg/dL — ABNORMAL HIGH (ref 70–99)
Potassium: 3.7 mmol/L (ref 3.5–5.1)
Sodium: 134 mmol/L — ABNORMAL LOW (ref 135–145)

## 2021-07-08 LAB — GLUCOSE, CAPILLARY
Glucose-Capillary: 121 mg/dL — ABNORMAL HIGH (ref 70–99)
Glucose-Capillary: 117 mg/dL — ABNORMAL HIGH (ref 70–99)
Glucose-Capillary: 127 mg/dL — ABNORMAL HIGH (ref 70–99)
Glucose-Capillary: 158 mg/dL — ABNORMAL HIGH (ref 70–99)
Glucose-Capillary: 173 mg/dL — ABNORMAL HIGH (ref 70–99)

## 2021-07-08 LAB — CBC
HCT: 31.4 % — ABNORMAL LOW (ref 36.0–46.0)
Hemoglobin: 10.5 g/dL — ABNORMAL LOW (ref 12.0–15.0)
MCH: 33 pg (ref 26.0–34.0)
MCHC: 33.4 g/dL (ref 30.0–36.0)
MCV: 98.7 fL (ref 80.0–100.0)
Platelets: 184 10*3/uL (ref 150–400)
RBC: 3.18 MIL/uL — ABNORMAL LOW (ref 3.87–5.11)
RDW: 13.4 % (ref 11.5–15.5)
WBC: 10.6 10*3/uL — ABNORMAL HIGH (ref 4.0–10.5)
nRBC: 0 % (ref 0.0–0.2)

## 2021-07-08 LAB — CULTURE, BLOOD (ROUTINE X 2)
Culture: NO GROWTH
Culture: NO GROWTH
Special Requests: ADEQUATE

## 2021-07-08 SURGERY — ESOPHAGOGASTRODUODENOSCOPY (EGD) WITH PROPOFOL
Anesthesia: Monitor Anesthesia Care

## 2021-07-08 MED ORDER — SODIUM CHLORIDE 0.9 % IV SOLN
INTRAVENOUS | Status: AC | PRN
  Administered 2021-07-08: 20 mL via INTRAVENOUS

## 2021-07-08 MED ORDER — CEFAZOLIN SODIUM-DEXTROSE 2-4 GM/100ML-% IV SOLN
2.0000 g | INTRAVENOUS | Status: AC
  Administered 2021-07-08: 2 g via INTRAVENOUS
  Filled 2021-07-08: qty 100

## 2021-07-08 MED ORDER — SUCCINYLCHOLINE CHLORIDE 200 MG/10ML IV SOSY
PREFILLED_SYRINGE | INTRAVENOUS | Status: DC | PRN
  Administered 2021-07-08: 120 mg via INTRAVENOUS

## 2021-07-08 MED ORDER — DEXAMETHASONE SODIUM PHOSPHATE 10 MG/ML IJ SOLN
INTRAMUSCULAR | Status: DC | PRN
  Administered 2021-07-08: 4 mg via INTRAVENOUS

## 2021-07-08 MED ORDER — ONDANSETRON HCL 4 MG/2ML IJ SOLN
INTRAMUSCULAR | Status: DC | PRN
  Administered 2021-07-08: 4 mg via INTRAVENOUS

## 2021-07-08 MED ORDER — LIDOCAINE 2% (20 MG/ML) 5 ML SYRINGE
INTRAMUSCULAR | Status: DC | PRN
  Administered 2021-07-08: 60 mg via INTRAVENOUS

## 2021-07-08 MED ORDER — HEPARIN SODIUM (PORCINE) 5000 UNIT/ML IJ SOLN
5000.0000 [IU] | Freq: Two times a day (BID) | INTRAMUSCULAR | Status: DC
  Administered 2021-07-08 – 2021-07-11 (×6): 5000 [IU] via SUBCUTANEOUS
  Filled 2021-07-08 (×6): qty 1

## 2021-07-08 MED ORDER — OSMOLITE 1.5 CAL PO LIQD
1000.0000 mL | ORAL | Status: DC
Start: 2021-07-08 — End: 2021-07-12
  Administered 2021-07-08 – 2021-07-11 (×3): 1000 mL
  Filled 2021-07-08 (×4): qty 1000

## 2021-07-08 MED ORDER — BACLOFEN 10 MG PO TABS
10.0000 mg | ORAL_TABLET | Freq: Three times a day (TID) | ORAL | Status: DC | PRN
Start: 2021-07-08 — End: 2021-07-12

## 2021-07-08 MED ORDER — CIPROFLOXACIN IN D5W 400 MG/200ML IV SOLN
INTRAVENOUS | Status: AC
  Filled 2021-07-08: qty 200

## 2021-07-08 MED ORDER — PHENYLEPHRINE 80 MCG/ML (10ML) SYRINGE FOR IV PUSH (FOR BLOOD PRESSURE SUPPORT)
PREFILLED_SYRINGE | INTRAVENOUS | Status: DC | PRN
  Administered 2021-07-08 (×2): 80 ug via INTRAVENOUS
  Administered 2021-07-08: 120 ug via INTRAVENOUS
  Administered 2021-07-08 (×2): 80 ug via INTRAVENOUS

## 2021-07-08 MED ORDER — PROPOFOL 10 MG/ML IV BOLUS
INTRAVENOUS | Status: DC | PRN
  Administered 2021-07-08: 100 mg via INTRAVENOUS
  Administered 2021-07-08: 20 mg via INTRAVENOUS

## 2021-07-08 NOTE — Anesthesia Postprocedure Evaluation (Signed)
Anesthesia Post Note  Patient: Pharmacist, hospital  Procedure(s) Performed: ESOPHAGOGASTRODUODENOSCOPY (EGD) WITH PROPOFOL PERCUTANEOUS ENDOSCOPIC GASTROSTOMY (PEG) PLACEMENT     Patient location during evaluation: PACU Anesthesia Type: General Level of consciousness: awake and patient cooperative Pain management: pain level controlled Vital Signs Assessment: post-procedure vital signs reviewed and stable Respiratory status: spontaneous breathing, nonlabored ventilation, respiratory function stable and patient connected to nasal cannula oxygen Cardiovascular status: blood pressure returned to baseline and stable Postop Assessment: no apparent nausea or vomiting Anesthetic complications: no   No notable events documented.  Last Vitals:  Vitals:   07/08/21 1309 07/08/21 1553  BP: 114/75 120/82  Pulse: (!) 110 (!) 107  Resp: 19 16  Temp: (!) 36.4 C 36.4 C  SpO2: 93% 97%    Last Pain:  Vitals:   07/08/21 1553  TempSrc: Oral  PainSc:                  Gwendolyn [name]

## 2021-07-08 NOTE — Progress Notes (Signed)
Occupational Therapy Treatment Patient Details Name: Gwendolyn [name] MRN: [id number] DOB: 1935-12-24 Today's Date: 07/08/2021   History of present illness 86 y.o. female [facility] to [facility] hospital on 07/03/2021 with AMS, L weakness, slurred speech. Pt in afib with RVR in ED. CT head with acute R BG hemorrhage. PMH includes afib, HTN, syncope, COVID 3/23   OT comments  Pt making gradual progress towards OT goals this session. Pt continues to present with L neglect, impaired balance, decreased activity tolerance, impaired postural control and LUE hemiplegia . Session focus on static sitting balance, L sided attention, LUE sensory reeducation and increasing overall activity tolerance. Pt currently requires total A for bed mobility and MIN- MOD A for static sitting balance EOB, pt did have good righting reaction able to initiate pulling on bed rail with RUE to maintain balance. Pt unable to track past midline and continues to present with difficulties rotating neck fully to L side, worked on PROM to facilitate improved cervical ROM. Applied lotion to LUE from EOB and encouraged pt to rub it in for sensory reeducation with RUE as pt currently has no sensation in LUE. Encouraged pt to work on gentle PROM stretches with LUE. Pt would continue to benefit from skilled occupational therapy while admitted and after d/c to address the below listed limitations in order to improve overall functional mobility and facilitate independence with BADL participation. DC plan remains appropriate, will follow acutely per POC.      Recommendations for follow up therapy are one component of a multi-disciplinary discharge planning process, led by the attending physician.  Recommendations may be updated based on patient status, additional functional criteria and insurance authorization.    Follow Up Recommendations  Skilled nursing-short term rehab (<3 hours/day)    Assistance Recommended at Discharge Frequent or constant  Supervision/Assistance  Patient can return home with the following  Two people to help with bathing/dressing/bathroom;Two people to help with walking and/or transfers;Assistance with cooking/housework;Assistance with feeding;Assist for transportation;Direct supervision/assist for financial management;Direct supervision/assist for medications management;Help with stairs or ramp for entrance   Equipment Recommendations  Other (comment) (TBD next venue of care)    Recommendations for Other Services      Precautions / Restrictions Precautions Precautions: Fall Precaution Comments: left neglect, left dense hemiplegia; PEG with ABD binder placed 5/30 Restrictions Weight Bearing Restrictions: No       Mobility Bed Mobility Overal bed mobility: Needs Assistance Bed Mobility: Supine to Sit, Sit to Supine     Supine to sit: Total assist, HOB elevated Sit to supine: Total assist, HOB elevated   General bed mobility comments: total A for supine>sit to R side of bed, pt initiating maneuvering RLE to EOB but needed total A efforts to elevate trunk and advance BLEs to EOB. pt did assist by transitioning onto R elbow during sit>supine but needed total A to elevate BLE back to bed    Transfers                   General transfer comment: deferred d/t lack of +2     Balance Overall balance assessment: Needs assistance Sitting-balance support: Single extremity supported, Feet supported Sitting balance-Leahy Scale: Poor Sitting balance - Comments: MIN- MOD A for static sitting balance, pt did having balance reaction when beginning to lose balance posteriorly noted to use RUE on bed rail to pull trunk forward Postural control: Left lateral lean, Posterior lean     Standing balance comment: NT  ADL either performed or assessed with clinical judgement   ADL Overall ADL's : Needs assistance/impaired         Upper Body Bathing: Maximal  assistance;Bed level Upper Body Bathing Details (indicate cue type and reason): worked on simulated bathing task to LUE with pt instructed to rub in lotion on LUE         Lower Body Dressing: Total assistance;Bed level     Toilet Transfer Details (indicate cue type and reason): deferred d/t lack of +2         Functional mobility during ADLs: Total assistance (sit<>supine only) General ADL Comments: continues to present with L neglect, impaired balance, decreased activity tolerance, impaired postural control and LUE hemiplegia    Extremity/Trunk Assessment Upper Extremity Assessment Upper Extremity Assessment: RUE deficits/detail;LUE deficits/detail RUE Deficits / Details: generalized weakness grossly 3/5 mmt RUE Sensation: WNL RUE Coordination: WNL LUE Deficits / Details: dense hemiplegia, currently with full PROM LUE Sensation: decreased light touch (no sensation on LUE) LUE Coordination: decreased gross motor;decreased fine motor   Lower Extremity Assessment Lower Extremity Assessment: Defer to PT evaluation   Cervical / Trunk Assessment Cervical / Trunk Exceptions: holds neck in R cerival rotation    Vision Baseline Vision/History: 0 No visual deficits Ability to See in Adequate Light: 0 Adequate Patient Visual Report: No change from baseline Vision Assessment?: Yes Eye Alignment: Impaired (comment) Ocular Range of Motion: Restricted on the left Alignment/Gaze Preference: Head turned;Gaze right Tracking/Visual Pursuits:  (can track to midline but cannot maintain)   Perception Perception Perception: Impaired Inattention/Neglect: Does not attend to left visual field;Does not attend to left side of body   Praxis Praxis Praxis: Impaired Praxis Impairment Details: Motor planning    Cognition Arousal/Alertness: Awake/alert, Lethargic (initially lethargic but aroused more as session progressed) Behavior During Therapy: Flat affect Overall Cognitive Status:  Impaired/Different from baseline Area of Impairment: Attention, Following commands, Safety/judgement, Awareness, Problem solving                   Current Attention Level: Sustained   Following Commands: Follows one step commands with increased time, Follows multi-step commands inconsistently Safety/Judgement: Decreased awareness of deficits, Decreased awareness of safety Awareness: Intellectual Problem Solving: Slow processing, Decreased initiation, Requires tactile cues, Requires verbal cues, Difficulty sequencing General Comments: poor awareness into deficits slow to process        Exercises Other Exercises Other Exercises: PROM to LUE via elbow flexion/extension, digit flexion/extension and PROM via cerival rotation R and L Other Exercises: sensory reeducation initiated with pt rubbing in lotion with RUE on LUE, encouraged pt to work on attending to LUE during all tasks    Shoulder Instructions       General Comments HR max 120s with exertion; SpO2 WFL on RA, education provided on elevating LUE on pillow for increased support and edema mgmt    Pertinent Vitals/ Pain       Pain Assessment Pain Assessment: Faces Faces Pain Scale: Hurts a little bit Pain Location: generalized with mobility Pain Descriptors / Indicators: Discomfort, Grimacing, Guarding Pain Intervention(s): Limited activity within patient's tolerance, Monitored during session, Repositioned  Home Living                                          Prior Functioning/Environment              Frequency  Min 2X/week  Progress Toward Goals  OT Goals(current goals can now be found in the care plan section)  Progress towards OT goals: Not progressing toward goals - comment (gradual)  Acute Rehab OT Goals Patient Stated Goal: none stated Time For Goal Achievement: 07/19/21 Potential to Achieve Goals: Fair  Plan Discharge plan remains appropriate;Frequency remains appropriate     Co-evaluation                 AM-PAC OT "6 Clicks" Daily Activity     Outcome Measure   Help from another person eating meals?: Total Help from another person taking care of personal grooming?: Total Help from another person toileting, which includes using toliet, bedpan, or urinal?: Total Help from another person bathing (including washing, rinsing, drying)?: Total Help from another person to put on and taking off regular upper body clothing?: Total Help from another person to put on and taking off regular lower body clothing?: Total 6 Click Score: 6    End of Session    OT Visit Diagnosis: Unsteadiness on feet (R26.81);Other abnormalities of gait and mobility (R26.89);Muscle weakness (generalized) (M62.81);Low vision, both eyes (H54.2);Other symptoms and signs involving cognitive function;Hemiplegia and hemiparesis Hemiplegia - Right/Left: Left Hemiplegia - dominant/non-dominant: Non-Dominant Hemiplegia - caused by: Nontraumatic SAH   Activity Tolerance Patient tolerated treatment well   Patient Left in bed;with call bell/phone within reach;with bed alarm set   Nurse Communication Mobility status        Time: 4621-9471 OT Time Calculation (min): 27 min  Charges: OT General Charges $OT Visit: 1 Visit OT Treatments $Therapeutic Activity: 23-37 mins  Lenor Derrick., COTA/L Acute Rehabilitation Services [phone]   Barron Schmid 07/08/2021, 3:47 PM

## 2021-07-08 NOTE — NC FL2 (Signed)
[place] MEDICAID FL2 LEVEL OF CARE SCREENING TOOL     IDENTIFICATION  [name] Birthdate: Nov 22, 1935 Sex: female Admission Date (Current Location): 07/03/2021  [facility] and [place] Number:  Herbalist and Address:  [facility], [place] [address] [place], [place] [address]      Provider Number: [id number]  Attending Physician Name and Address:  Stroke, Md, MD  Relative Name and Phone Number:  Lavella Lemons  [phone]    Current Level of Care: Hospital Recommended Level of Care: [place] Prior Approval Number:    Date Approved/Denied:   PASRR Number: [id number]  Discharge Plan: SNF    Current Diagnoses: Patient Active Problem List   Diagnosis Date Noted   Persistent atrial fibrillation ([place]) 07/07/2021   Pressure injury of skin 07/07/2021   Intracerebral hemorrhage ([place]) 07/03/2021    Orientation RESPIRATION BLADDER Height & Weight     Self, Time, Situation, Place  Normal Incontinent, External catheter (External Urinary Catheter) Weight: 141 lb 8.6 oz (64.2 kg) Height:  5\' 1"  (154.9 cm)  BEHAVIORAL SYMPTOMS/MOOD NEUROLOGICAL BOWEL NUTRITION STATUS      Incontinent Feeding tube ((OSMOLITE 1.5 CAL)liquid 1,000 mL)  AMBULATORY STATUS COMMUNICATION OF NEEDS Skin   Total Care Verbally Other (Comment) (Appropriate for ethnicity,dry,ecchmosis,arm,foot,hip,bilateral,erythema redness,heel,L,PI sacrum medial deep tissue foam lift dressing,clean,dry,intact,every 3 days)                       Personal Care Assistance Level of Assistance  Bathing, Feeding, Dressing Bathing Assistance: Maximum assistance   Dressing Assistance: Maximum assistance     Functional Limitations Info  Sight, Hearing, Speech   Hearing Info: Adequate Speech Info: Adequate    SPECIAL CARE FACTORS FREQUENCY  PT (By licensed PT), OT (By licensed OT)     PT Frequency: 5x min weekly OT Frequency: 5x min weekly             Contractures Contractures Info: Not present    Additional Factors Info  Code Status, Allergies Code Status Info: FULL Allergies Info: NKA           Current Medications (07/08/2021):  This is the current hospital active medication list Current Facility-Administered Medications  Medication Dose Route Frequency Provider Last Rate Last Admin   acetaminophen (TYLENOL) tablet 650 mg  650 mg Oral Q4H PRN August Albino, NP       Or   acetaminophen (TYLENOL) 160 MG/5ML solution 650 mg  650 mg Per Tube Q4H PRN August Albino, NP   650 mg at 07/06/21 1041   Or   acetaminophen (TYLENOL) suppository 650 mg  650 mg Rectal Q4H PRN August Albino, NP       amiodarone (PACERONE) tablet 200 mg  200 mg Per Tube BID Pixie Casino, MD   200 mg at 07/08/21 0914   atorvastatin (LIPITOR) tablet 40 mg  40 mg Per Tube Daily de Yolanda Manges, Cortney E, NP   40 mg at 07/07/21 0805   baclofen (LIORESAL) tablet 10 mg  10 mg Per Tube TID PRN Norm Parcel, PA-C       chlorhexidine (PERIDEX) 0.12 % solution 15 mL  15 mL Mouth Rinse BID [name], MD   15 mL at 07/07/21 2111   feeding supplement (OSMOLITE 1.5 CAL) liquid 1,000 mL  1,000 mL Per Tube Continuous Norm Parcel, PA-C       feeding supplement (PROSource TF) liquid 45 mL  45  mL Per Tube BID Jacky Kindle, MD   45 mL at 07/07/21 2116   heparin injection 5,000 Units  5,000 Units Subcutaneous Q12H Meuth, Brooke A, PA-C       labetalol (NORMODYNE) injection 5-10 mg  5-10 mg Intravenous Q2H PRN [name], MD       MEDLINE mouth rinse  15 mL Mouth Rinse q12n4p [name], MD   15 mL at 07/07/21 1816   metoprolol tartrate (LOPRESSOR) 25 mg/10 mL oral suspension 25 mg  25 mg Per Tube Q8H Pixie Casino, MD   25 mg at 07/08/21 0617   multivitamin with minerals tablet 1 tablet  1 tablet Per Tube Daily Jacky Kindle, MD   1 tablet at 07/07/21 0806   pantoprazole sodium (PROTONIX) 40 mg/20 mL oral suspension 40 mg  40 mg Per Tube QHS [name], MD    40 mg at 07/07/21 2111   polyethylene glycol (MIRALAX / GLYCOLAX) packet 17 g  17 g Per Tube Daily Jacky Kindle, MD   17 g at 07/07/21 0804   sennosides (SENOKOT) 8.8 MG/5ML syrup 10 mL  10 mL Per Tube BID Jacky Kindle, MD   10 mL at 07/07/21 2116     Discharge Medications: Please see discharge summary for a list of discharge medications.  Relevant Imaging Results:  Relevant Lab Results:   Additional Information [phone]  Benard Halsted, LCSW

## 2021-07-08 NOTE — Transfer of Care (Signed)
Immediate Anesthesia Transfer of Care Note  Patient: Lanetta Mackowski  Procedure(s) Performed: ESOPHAGOGASTRODUODENOSCOPY (EGD) WITH PROPOFOL PERCUTANEOUS ENDOSCOPIC GASTROSTOMY (PEG) PLACEMENT  Patient Location: Endoscopy Unit  Anesthesia Type:General  Level of Consciousness: awake  Airway & Oxygen Therapy: Patient Spontanous Breathing and Patient connected to nasal cannula oxygen  Post-op Assessment: Report given to RN and Post -op Vital signs reviewed and stable  Post vital signs: Reviewed and stable  Last Vitals:  Vitals Value Taken Time  BP 109/64   Temp    Pulse 109 07/08/21 1225  Resp 24 07/08/21 1225  SpO2 100 % 07/08/21 1225  Vitals shown include unvalidated device data.  Last Pain:  Vitals:   07/08/21 1018  TempSrc: Temporal  PainSc: 0-No pain         Complications: No notable events documented.

## 2021-07-08 NOTE — Progress Notes (Signed)
Patient ID: Gwendolyn [name], female   DOB: 03/02/1935, 86 y.o.   MRN: [id number] [facility] Progress Note     Subjective: CC-  No complaints.  Objective: Vital signs in last 24 hours: Temp:  [98.4 F (36.9 C)-99.3 F (37.4 C)] 98.5 F (36.9 C) (05/30 0814) Pulse Rate:  [116-125] 116 (05/30 0814) Resp:  [15-22] 15 (05/30 0000) BP: (98-132)/(62-88) 123/73 (05/30 0814) SpO2:  [92 %-98 %] 96 % (05/30 0814) Weight:  [64.2 kg] 64.2 kg (05/30 0400) Last BM Date : 07/06/21  Intake/Output from previous day: 05/29 0701 - 05/30 0700 In: -  Out: 400 [Urine:400] Intake/Output this shift: No intake/output data recorded.  PE: Gen:  Alert, NAD, pleasant HEENT: Cortrak in nare. Right gaze deviation  Card:  irregular, tachy 110s Pulm:  rate and effort normal on room air Abd: Soft, NT/ND, +BS, no HSM, no hernia Ext:  weakness noted to LUE/LLE  Lab Results:  Recent Labs    07/07/21 0117 07/08/21 0044  WBC 8.5 10.6*  HGB 10.1* 10.5*  HCT 29.6* 31.4*  PLT 173 184   BMET Recent Labs    07/07/21 0117 07/08/21 0044  NA 135 134*  K 3.7 3.7  CL 107 105  CO2 23 23  GLUCOSE 142* 140*  BUN 23 23  CREATININE 0.67 0.71  CALCIUM 7.7* 8.0*   PT/INR No results for input(s): LABPROT, INR in the last 72 hours. CMP     Component Value Date/Time   NA 134 (L) 07/08/2021 0044   NA 140 03/18/2017 1130   K 3.7 07/08/2021 0044   CL 105 07/08/2021 0044   CO2 23 07/08/2021 0044   GLUCOSE 140 (H) 07/08/2021 0044   BUN 23 07/08/2021 0044   BUN 27 03/18/2017 1130   CREATININE 0.71 07/08/2021 0044   CALCIUM 8.0 (L) 07/08/2021 0044   PROT 5.3 (L) 07/05/2021 1153   ALBUMIN 2.5 (L) 07/05/2021 1153   AST 52 (H) 07/05/2021 1153   ALT 35 07/05/2021 1153   ALKPHOS 58 07/05/2021 1153   BILITOT 0.2 (L) 07/05/2021 1153   GFRNONAA >60 07/08/2021 0044   GFRAA >60 10/26/2019 1137   Lipase  No results found for: LIPASE     Studies/Results: CT HEAD WO CONTRAST ( )  Result  Date: 07/08/2021 CLINICAL DATA:  86 year old female code stroke presentation with right MCA branch occlusion, right MCA territory infarct with some hemorrhagic transformation. EXAM: CT HEAD WITHOUT CONTRAST TECHNIQUE: Contiguous axial images were obtained from the base of the skull through the vertex without intravenous contrast. RADIATION DOSE REDUCTION: This exam was performed according to the departmental dose-optimization program which includes automated exposure control, adjustment of the mA and/or kV according to patient size and/or use of iterative reconstruction technique. COMPARISON:  Brain MRI 07/04/2021 and earlier. FINDINGS: Brain: Patchy and confluent cytotoxic edema throughout much of the right MCA territory, configuration and extent stable from DWI abnormality on 07/04/2021. Right basal ganglia petechial hemorrhage appears stable to mildly regressed since the CT on 07/03/2021. No new hemorrhagic transformation identified. But mass effect on the right lateral ventricle has progressed since 07/03/2021, and there is now trace leftward midline shift of 2 mm. No ventriculomegaly. No IVH. Basilar cisterns remain normal. Stable gray-white matter differentiation in the left hemisphere and posterior fossa. Vascular: Calcified atherosclerosis at the skull base. Skull: No acute osseous abnormality identified. Sinuses/Orbits: Visualized paranasal sinuses and mastoids are stable and well aerated. Other: Left nasoenteric tube in place. No acute orbit or scalp soft tissue finding.  IMPRESSION: 1. Relatively large Right MCA territory infarct with cytotoxic edema. Petechial hemorrhage in the right basal ganglia appears stable to slightly decreased since 07/03/2021. But intracranial mass effect has mildly progressed, including 2 mm of leftward midline shift now. 2. No other acute intracranial abnormality. Electronically Signed   By: Odessa Fleming M.D.   On: 07/08/2021 05:41    Anti-infectives: Anti-infectives (From  admission, onward)    None        Assessment/Plan R MCA stroke Dysphagia - I called and discussed PEG tube placement with the patient's niece, Carole Civil, including risks and benefits of the procedure. She is agreeable to proceed, verbal consent obtained over the phone. Tube feedings have been held. Plan to proceed with PEG in endo today at 1100.   ID - none FEN - TF held for procedure VTE - sq heparin Foley - none  AFib - per cardiology Reasonable rate control for afib, restart anticoagulation when safe  I reviewed neuro and cardiology notes, last 24 h vitals and pain scores, last 48 h intake and output, last 24 h labs and trends, and last 24 h imaging results    LOS: 5 days    Franne Forts, [facility] 07/08/2021, 8:21 AM Please see Amion for pager number during day hours 7:00am-4:30pm

## 2021-07-08 NOTE — Anesthesia Preprocedure Evaluation (Signed)
Anesthesia Evaluation  Patient identified by MRN, date of birth, ID band Patient confused    Reviewed: Allergy & Precautions, NPO status , Patient's Chart, lab work & pertinent test results, Unable to perform ROS - Chart review only  History of Anesthesia Complications Negative for: history of anesthetic complications  Airway Mallampati: III  TM Distance: >3 FB   Mouth opening: Limited Mouth Opening  Dental  (+) Teeth Intact   Pulmonary neg pulmonary ROS,    breath sounds clear to auscultation       Cardiovascular hypertension, Pt. on medications and Pt. on home beta blockers + dysrhythmias Atrial Fibrillation  Rhythm:Irregular Rate:Tachycardia  1. Left ventricular ejection fraction, by estimation, is 55% with beat to  beat variability. The left ventricle has normal function. The left  ventricle has no regional wall motion abnormalities. Left ventricular  diastolic parameters are indeterminate.  2. Right ventricular systolic function is mildly reduced. The right  ventricular size is normal. There is normal pulmonary artery systolic  pressure. The estimated right ventricular systolic pressure is 23.8 mmHg.  3. Left atrial size was moderately dilated.  4. Right atrial size was moderately dilated.  5. The mitral valve is degenerative. Trivial mitral valve regurgitation.  No evidence of mitral stenosis. Severe mitral annular calcification.  6. The aortic valve is grossly normal. There is mild calcification of the  aortic valve. There is mild thickening of the aortic valve. Aortic valve  regurgitation is not visualized. Aortic valve sclerosis is present, with  no evidence of aortic valve  stenosis.  7. The inferior vena cava is normal in size with <50% respiratory  variability, suggesting right atrial pressure of 8 mmHg.    Neuro/Psych CVA, Residual Symptoms negative psych ROS   GI/Hepatic Neg liver ROS, Feeding tube    Endo/Other  Lab Results      Component                Value               Date                      HGBA1C                   5.7 (H)             07/04/2021             Renal/GU negative Renal ROSLab Results      Component                Value               Date                      CREATININE               0.71                07/08/2021                Musculoskeletal   Abdominal   Peds  Hematology  (+) Blood dyscrasia, anemia , Lab Results      Component                Value               Date  WBC                      10.6 (H)            07/08/2021                HGB                      10.5 (L)            07/08/2021                HCT                      31.4 (L)            07/08/2021                MCV                      98.7                07/08/2021                PLT                      184                 07/08/2021              Anesthesia Other Findings   Reproductive/Obstetrics                             Anesthesia Physical Anesthesia Plan  ASA: 4  Anesthesia Plan: General   Post-op Pain Management:    Induction: Intravenous  PONV Risk Score and Plan: 3 and Ondansetron and Dexamethasone  Airway Management Planned: Oral ETT  Additional Equipment: None  Intra-op Plan:   Post-operative Plan: Extubation in OR  Informed Consent:     History available from chart only  Plan Discussed with: CRNA  Anesthesia Plan Comments:         Anesthesia Quick Evaluation

## 2021-07-08 NOTE — Progress Notes (Signed)
Speech Language Pathology Treatment: Dysphagia  Patient Details Name: Gwendolyn [name] MRN: [id number] DOB: Jun 16, 1935 Today's Date: 07/08/2021 Time: 1340-1400 SLP Time Calculation (min) (ACUTE ONLY): 20 min  Assessment / Plan / Recommendation Clinical Impression  Patient seen by SLP for skilled treatment focused on dysphagia goals. PT had worked with patient just prior to this session and informed SLP that she worked a lot on trying to loosen up patient's very tight neck muscles and was able to get patient to turn head to midline. When SLP arrived into room, patient was awake, alert, holding head up and only mildly turned to right side but not resting on right shoulder as she has been. With cues she was able to turn head to midline but not to left. When asked, patient requested she would like some "caffeine" and clarified "coffee". SLP thickened up some coffee to honey thick consistency and fed patient via spoon sips. She exhibited delayed oral manipulation and transit of liquids and suspected delayed swallow initiation but no cough or throat clear response observed. She then started to have trouble keeping her eyes open and so SLP performed oral care to check for residuals before reclining her to 30 degrees. Patient got PEG today and SLP continues to recommend to remain NPO with nutrition and medication via PEG. SLP will continue to follow patient for cognitive and swallow treatment.   HPI HPI: Patient is an 86 y.o. female with PMH: a fib not on anticougulation, HTN, syncope. She lives in a hotel and presented to [facility] ED after being found by hotel staff with AMS. Per report, last known well was two days prior. She presented to ED with left side weakness, left side facial droop and slurred speech. CT head showed acute right basal ganglia hemorrhage. She failed the Yale swallow screen with nursing and has been NPO. MBS on 5/26 recommending continue NPO but trials of honey thick liquids and puree solids with  SLP.      SLP Plan  Continue with current plan of care      Recommendations for follow up therapy are one component of a multi-disciplinary discharge planning process, led by the attending physician.  Recommendations may be updated based on patient status, additional functional criteria and insurance authorization.    Recommendations  Diet recommendations: NPO Medication Administration: Via alternative means                Oral Care Recommendations: Oral care QID;Staff/trained caregiver to provide oral care Follow Up Recommendations: Skilled nursing-short term rehab (<3 hours/day) Assistance recommended at discharge: Frequent or constant Supervision/Assistance SLP Visit Diagnosis: Dysphagia, oropharyngeal phase (R13.12) Plan: Continue with current plan of care           Angela Nevin, MA, CCC-SLP Speech Therapy

## 2021-07-08 NOTE — Anesthesia Procedure Notes (Signed)
Procedure Name: Intubation Date/Time: 07/08/2021 11:46 AM Performed by: Janene Harvey, CRNA Pre-anesthesia Checklist: Patient identified, Emergency Drugs available, Suction available and Patient being monitored Patient Re-evaluated:Patient Re-evaluated prior to induction Oxygen Delivery Method: Circle system utilized Preoxygenation: Pre-oxygenation with 100% oxygen Induction Type: IV induction Ventilation: Mask ventilation without difficulty Laryngoscope Size: Mac and 4 Grade View: Grade I Tube type: Subglottic suction tube Tube size: 7.0 mm Placement Confirmation: positive ETCO2 Secured at: 21 cm Dental Injury: Teeth and Oropharynx as per pre-operative assessment

## 2021-07-08 NOTE — Progress Notes (Signed)
Physical Therapy Treatment Patient Details Name: Gwendolyn [name] MRN: [id number] DOB: Oct 28, 1935 Today's Date: 07/08/2021   History of Present Illness 86 y.o. female presents to [facility] hospital on 07/03/2021 with AMS, L weakness, slurred speech. Pt in afib with RVR in ED. CT head with acute R BG hemorrhage. PMH includes afib, HTN, syncope, COVID 3/23    PT Comments    Patient asleep after return from PEG procedure and utilized time to work on PROM to neck and LLE. Had previously discussed with SLP would work on head/neck ROM/positioning as they have had difficulty working on feeding as pt has head strongly rotated to right and chin tucked towards rt shoulder. After session, notified Gwendolyn [name], SLP that pt was in better position at this time and he arrived to work on swallowing with pt.     Recommendations for follow up therapy are one component of a multi-disciplinary discharge planning process, led by the attending physician.  Recommendations may be updated based on patient status, additional functional criteria and insurance authorization.  Follow Up Recommendations  Skilled nursing-short term rehab (<3 hours/day)     Assistance Recommended at Discharge Frequent or constant Supervision/Assistance  Patient can return home with the following Two people to help with walking and/or transfers;Two people to help with bathing/dressing/bathroom;Assistance with cooking/housework;Assistance with feeding;Direct supervision/assist for medications management;Direct supervision/assist for financial management;Assist for transportation;Help with stairs or ramp for entrance   Equipment Recommendations  Other (comment) (defer to next venue of care)    Recommendations for Other Services       Precautions / Restrictions Precautions Precautions: Fall Precaution Comments: left neglect, left dense hemiplegia Restrictions Weight Bearing Restrictions: No     Mobility  Bed Mobility Overal bed mobility: Needs  Assistance Bed Mobility: Supine to Sit     Supine to sit: Total assist, HOB elevated     General bed mobility comments: moved into chair-like position at end of session (via bed mechanics)    Transfers                        Ambulation/Gait               General Gait Details: unable   Stairs             Wheelchair Mobility    Modified Rankin (Stroke Patients Only)       Balance                                            Cognition Arousal/Alertness: Lethargic, Suspect due to medications (just returned from PEG procedure; awake, alert by end of session) Behavior During Therapy: Flat affect Overall Cognitive Status: Impaired/Different from baseline Area of Impairment: Attention, Following commands, Safety/judgement, Awareness, Problem solving                   Current Attention Level: Sustained   Following Commands: Follows one step commands with increased time, Follows one step commands consistently Safety/Judgement: Decreased awareness of deficits, Decreased awareness of safety Awareness:  (pre-intellectual) Problem Solving: Slow processing, Decreased initiation, Requires tactile cues, Requires verbal cues, Difficulty sequencing General Comments: states "they tell me I had a stroke" but unable to identify which side has been effected        Exercises Other Exercises Other Exercises: Stretches provided to neck into L rotation and left lateral flexion--initiate PROM  while pt asleep wtih ability to rotate to 45* and laterally flex to 30*; once awake, worked on actively rotating and able to turn to 30* to the left with max vc Other Exercises: PROM LLE into extension, adduction, hip IR (was in full flexor synergy on arrival); able to achieve midline/neutral and placed prevalon boot plus pillow to try to maintain position; LUE also abducted and elevated on pillow    General Comments        Pertinent Vitals/Pain Pain  Assessment Pain Assessment: Faces Faces Pain Scale: Hurts even more Pain Location: bil sides of neck Pain Descriptors / Indicators: Discomfort, Grimacing, Guarding Pain Intervention(s): Limited activity within patient's tolerance, Monitored during session, Repositioned    Home Living                          Prior Function            PT Goals (current goals can now be found in the care plan section) Acute Rehab PT Goals Patient Stated Goal: to get better PT Goal Formulation: With patient Time For Goal Achievement: 07/19/21 Potential to Achieve Goals: Fair Progress towards PT goals: Progressing toward goals    Frequency    Min 3X/week      PT Plan Current plan remains appropriate    Co-evaluation              AM-PAC PT "6 Clicks" Mobility   Outcome Measure  Help needed turning from your back to your side while in a flat bed without using bedrails?: Total Help needed moving from lying on your back to sitting on the side of a flat bed without using bedrails?: Total Help needed moving to and from a bed to a chair (including a wheelchair)?: Total Help needed standing up from a chair using your arms (e.g., wheelchair or bedside chair)?: Total Help needed to walk in hospital room?: Total Help needed climbing 3-5 steps with a railing? : Total 6 Click Score: 6    End of Session Equipment Utilized During Treatment: Gait belt Activity Tolerance: Patient tolerated treatment well Patient left: with call bell/phone within reach;in bed;with bed alarm set   PT Visit Diagnosis: Unsteadiness on feet (R26.81);Muscle weakness (generalized) (M62.81);Difficulty in walking, not elsewhere classified (R26.2);Other symptoms and signs involving the nervous system (R29.898)     Time: [id number] PT Time Calculation (min) (ACUTE ONLY): 18 min  Charges:  $Neuromuscular Re-education: 8-22 mins                      Arby Barrette, PT Acute Rehabilitation Services  Pager  [phone] Office [phone]    Rexanne Mano 07/08/2021, 1:55 PM

## 2021-07-08 NOTE — Op Note (Signed)
[facility] [name] Procedure Date : 07/08/2021 MRN: [id number] Attending MD: Georganna Skeans , MD Date of Birth: 1935/12/10 CSN: [id number] Age: 86 Admit Type: Outpatient Procedure:                Upper GI endoscopy Indications:              Place PEG because patient is unable to eat due to                            stroke (CVA) Providers:                Georganna Skeans, MD, Melina Modena, PA-C; Jeanella Cara, RN, Despina Pole, Technician Referring MD:              Medicines:                See the Anesthesia note for documentation of the                            administered medications Complications:            No immediate complications. Estimated Blood Loss:     Estimated blood loss was minimal. Procedure:                Pre-Anesthesia Assessment:                           - Prior to the procedure, a History and Physical                            was performed, and patient medications and                            allergies were reviewed. The patient is unable to                            give consent secondary to the patient's altered                            mental status. The risks and benefits of the                            procedure and the sedation options and risks were                            discussed with the patient's relative. All                            questions were answered and informed consent was                            obtained. Patient identification and proposed  procedure were verified by the physician, the                            nurse, the anesthetist and the technician in the                            endoscopy suite. Mental Status Examination: alert                            but confused. Airway Examination: normal                            oropharyngeal airway and neck mobility. ASA Grade                            Assessment: III  - A patient with severe systemic                            disease. After reviewing the risks and benefits,                            the patient was deemed in satisfactory condition to                            undergo the procedure. The anesthesia plan was to                            use general anesthesia. Immediately prior to                            administration of medications, the patient was                            re-assessed for adequacy to receive sedatives. The                            heart rate, respiratory rate, oxygen saturations,                            blood pressure, adequacy of pulmonary ventilation,                            and response to care were monitored throughout the                            procedure. The physical status of the patient was                            re-assessed after the procedure.                           After obtaining informed consent, the endoscope was  passed under direct vision. Throughout the                            procedure, the patient's blood pressure, pulse, and                            oxygen saturations were monitored continuously. The                            [id number] Olympus endoscope was introduced                            through the mouth, and advanced to the second part                            of duodenum. The upper GI endoscopy was                            accomplished without difficulty. The patient                            tolerated the procedure well. Scope In: Scope Out: Findings:      No gross lesions were noted in the esophagus.      No gross lesions were noted in the stomach. Placement of an externally       removable PEG with no T-fasteners was successfully completed. The       external bumper was at the 2.0 cm marking on the tube. Estimated blood       loss was minimal.      No gross lesions were noted in the second portion of the  duodenum. Impression:               - No gross lesions in esophagus.                           - No gross lesions in the stomach.                           - No gross lesions in the second portion of the                            duodenum.                           - An externally removable PEG placement was                            successfully completed.                           - No specimens collected. Recommendation:           tube feeds in 4h, meds OK now. Binder at all times                            to protect PEG Procedure Code(s):        ---  Professional ---                           [id number], Esophagogastroduodenoscopy, flexible,                            transoral; with directed placement of percutaneous                            gastrostomy tube Diagnosis Code(s):        --- Professional ---                           [id number], Other sequelae of cerebral infarction                           R63.3, Feeding difficulties                           Z43.1, Encounter for attention to gastrostomy CPT copyright 2019 [employer]. All rights reserved. The codes documented in this report are preliminary and upon coder review may  be revised to meet current compliance requirements. Georganna Skeans, MD 07/08/2021 12:11:22 PM This report has been signed electronically. Number of Addenda: 0

## 2021-07-08 NOTE — Progress Notes (Addendum)
STROKE TEAM PROGRESS NOTE   INTERVAL HISTORY Patient is seen in her room with no family at the bedside.  Her neurological exam is stable today.  PEG tube placed today.   Vitals:   07/08/21 1231 07/08/21 1237 07/08/21 1241 07/08/21 1309  BP: 107/61  (!) 111/95 114/75  Pulse: (!) 111 (!) 114  (!) 110  Resp: 20 (!) 24 (!) 23 19  Temp:    (!) 97.5 F (36.4 C)  TempSrc:    Oral  SpO2: 99% 100% 100% 93%  Weight:      Height:       CBC:  Recent Labs  Lab 07/03/21 1147 07/04/21 0040 07/07/21 0117 07/08/21 0044  WBC 16.7*   < > 8.5 10.6*  NEUTROABS 14.5*  --   --   --   HGB 13.9   < > 10.1* 10.5*  HCT 41.9   < > 29.6* 31.4*  MCV 98.8   < > 97.7 98.7  PLT 236   < > 173 184   < > = values in this interval not displayed.    Basic Metabolic Panel:  Recent Labs  Lab 07/06/21 0506 07/06/21 1627 07/07/21 0117 07/08/21 0044  NA 136  --  135 134*  K 3.7  --  3.7 3.7  CL 105  --  107 105  CO2 21*  --  23 23  GLUCOSE 140*  --  142* 140*  BUN 25*  --  23 23  CREATININE 0.78  --  0.67 0.71  CALCIUM 8.0*  --  7.7* 8.0*  MG 1.9 1.9  --   --   PHOS 1.6* 1.8* 2.0*  --     Lipid Panel:  Recent Labs  Lab 07/04/21 0040  CHOL 226*  TRIG 80  HDL 88  CHOLHDL 2.6  VLDL 16  LDLCALC 122*    HgbA1c:  Recent Labs  Lab 07/04/21 0040  HGBA1C 5.7*    Urine Drug Screen:  Recent Labs  Lab 07/03/21 1626  LABOPIA NONE DETECTED  COCAINSCRNUR NONE DETECTED  LABBENZ NONE DETECTED  AMPHETMU NONE DETECTED  THCU NONE DETECTED  LABBARB NONE DETECTED     Alcohol Level No results for input(s): ETH in the last 168 hours.  IMAGING past 24 hours CT HEAD WO CONTRAST (5MM)  Result Date: 07/08/2021 CLINICAL DATA:  86 year old female code stroke presentation with right MCA branch occlusion, right MCA territory infarct with some hemorrhagic transformation. EXAM: CT HEAD WITHOUT CONTRAST TECHNIQUE: Contiguous axial images were obtained from the base of the skull through the vertex without  intravenous contrast. RADIATION DOSE REDUCTION: This exam was performed according to the departmental dose-optimization program which includes automated exposure control, adjustment of the mA and/or kV according to patient size and/or use of iterative reconstruction technique. COMPARISON:  Brain MRI 07/04/2021 and earlier. FINDINGS: Brain: Patchy and confluent cytotoxic edema throughout much of the right MCA territory, configuration and extent stable from DWI abnormality on 07/04/2021. Right basal ganglia petechial hemorrhage appears stable to mildly regressed since the CT on 07/03/2021. No new hemorrhagic transformation identified. But mass effect on the right lateral ventricle has progressed since 07/03/2021, and there is now trace leftward midline shift of 2 mm. No ventriculomegaly. No IVH. Basilar cisterns remain normal. Stable gray-white matter differentiation in the left hemisphere and posterior fossa. Vascular: Calcified atherosclerosis at the skull base. Skull: No acute osseous abnormality identified. Sinuses/Orbits: Visualized paranasal sinuses and mastoids are stable and well aerated. Other: Left nasoenteric tube in place.  No acute orbit or scalp soft tissue finding. IMPRESSION: 1. Relatively large Right MCA territory infarct with cytotoxic edema. Petechial hemorrhage in the right basal ganglia appears stable to slightly decreased since 07/03/2021. But intracranial mass effect has mildly progressed, including 2 mm of leftward midline shift now. 2. No other acute intracranial abnormality. Electronically Signed   By: Genevie Ann M.D.   On: 07/08/2021 05:41    PHYSICAL EXAM General:  Alert, well-developed, well-nourished elderly Caucasian lady in no acute distress Respiratory:  Regular, unlabored respirations on room air  NEURO:  Mental Status: AA&Ox3 .  Head and gaze deviation to the right Speech/Language: speech is without dysarthria or aphasia.  Fluency, and comprehension intact.  Cranial Nerves:   II: PERRL. Visual fields showed decreased blink to threat on the left III, IV, VI: right gaze deviation able to cross midline with effort V: Sensation is intact to light touch and symmetrical to face with extinction to DSS on left side VII: left sided facial droop  VIII: hearing intact to voice. IX, X: Phonation is normal.  XII: tongue is midline without fasciculations. Motor: 5/5 strength to RUE and RLE, 2/5 to LUE, 2/5 to LLE Sensation- Intact to light touch on right and in LLE.  Impaired on LUE Extinction present to DSS on the left  Coordination: FTN intact on right Gait- deferred   ASSESSMENT/PLAN Gwendolyn [name] is a 86 y.o. female with history of atrial fibrillation not on anticoagulation, HTN and syncope presenting with altered mental status, left sided facial droop, slurred speech and left sided weakness.  She was found to have an acute stroke in the right basal ganglia with hemorrhagic transformation.  She was noted to be in a-fib with RVR and was initially treated with a diltiazem gtt then switched to amiodarone due to hypotension.  Stroke:  right MCA large infarct with hemorrhagic transformation likely secondary due to embolism in setting of atrial fibrillation not on anticoagulation CT head multiple acute hemorrhagic infarct in right basal ganglia CTA head & neck distal right M1 MCA stenosis and occlusion of distal right M2 MCA MRI large right MCA infarct with HT at right BG and caudate CT head 5/30 Relatively large Right MCA territory infarct with cytotoxic edema. Petechial hemorrhage in the right BG appears stable to slightly decreased since 07/03/2021. But intracranial mass effect has mildly progressed, including 2 mm of leftward midline shift now. 2D Echo EF 55%, moderately dilated left atrium, no atrial level shunt LDL 122 HgbA1c 5.7 VTE prophylaxis - heparin subcu aspirin 81 mg daily prior to admission, now on No antithrombotic due to HT and large infarct size.    Therapy recommendations:  SNF Disposition:  pending  Atrial fibrillation with RVR Patient is in a-fib RVR with rates up to the 160s Not on anticoagulation at home Cardiology on board, appreciate help Was initially treated with diltiazem, now on metoprolol 25 mg every 8h IV amiodarone switched to p.o. Still afib RVR, will contact cardiology for further recs  Hypertension Home meds:  carvedilol 6.25 mg BID, lisinopril 40 mg daily Stable on the low end Now on metoprolol Long-term BP goal normotensive  Hyperlipidemia Home meds:  none LDL 122, goal < 70 Add atorvastatin 40 mg daily  Continue statin at discharge  Dysphagia Did not pass swallow Speech on board, agree with PEG as it will be some time before she can swallow safely  PEG done 5/30 with trauma service Continue tube feeding  Other Stroke Risk Factors Advanced Age >/=  65   Other Active Problems Syncope Leukocytosis WBC 14.1-> 9.9-> 8.5->10.6 Elevated AST 85->52  Hospital day # 5  Patient seen and examined by NP/APP with MD. MD to update note as needed.   Janine Ores, DNP, FNP-BC [facility] Pager: [phone]  ATTENDING NOTE: I reviewed above note and agree with the assessment and plan. Pt was seen and examined.   Patient was seen after PEG tube placement.  She is lying in bed, core track has removed, felt more comfortable without core track tube.  She is initially sleeping, however easily arousable and open eyes on voice, answer orientation questions without difficulty, still has left hemiplegia and left facial droop.  However minimal dysarthria.  Continue tube feeding.  Still has mild A-fib RVR, will contact cardiology again for further recommendation.  Repeat CT showed slight midline shift, large infarct with decreasing hemorrhagic transformation.  Still holding off antiplatelet given large size of the infarct and hemorrhagic transformation.  PT therapy recommend SNF.  Pending placement.  For  detailed assessment and plan, please refer to above as I have made changes wherever appropriate.   [name], MD PhD Stroke Neurology 07/08/2021 6:26 PM    To contact Stroke Continuity provider, please refer to [email or web address] After hours, contact General Neurology

## 2021-07-09 ENCOUNTER — Encounter (HOSPITAL_COMMUNITY): Payer: Self-pay | Admitting: General Surgery

## 2021-07-09 DIAGNOSIS — I4819 Other persistent atrial fibrillation: Secondary | ICD-10-CM | POA: Diagnosis not present

## 2021-07-09 DIAGNOSIS — I6389 Other cerebral infarction: Secondary | ICD-10-CM | POA: Diagnosis not present

## 2021-07-09 LAB — BASIC METABOLIC PANEL
Anion gap: 7 (ref 5–15)
BUN: 27 mg/dL — ABNORMAL HIGH (ref 8–23)
CO2: 23 mmol/L (ref 22–32)
Calcium: 8.2 mg/dL — ABNORMAL LOW (ref 8.9–10.3)
Chloride: 103 mmol/L (ref 98–111)
Creatinine, Ser: 0.74 mg/dL (ref 0.44–1.00)
GFR, Estimated: >60 mL/min (ref >60)
Glucose, Bld: 170 mg/dL — ABNORMAL HIGH (ref 70–99)
Potassium: 4.3 mmol/L (ref 3.5–5.1)
Sodium: 133 mmol/L — ABNORMAL LOW (ref 135–145)

## 2021-07-09 LAB — CBC
HCT: 30.4 % — ABNORMAL LOW (ref 36.0–46.0)
Hemoglobin: 9.9 g/dL — ABNORMAL LOW (ref 12.0–15.0)
MCH: 31.8 pg (ref 26.0–34.0)
MCHC: 32.6 g/dL (ref 30.0–36.0)
MCV: 97.7 fL (ref 80.0–100.0)
Platelets: 203 10*3/uL (ref 150–400)
RBC: 3.11 MIL/uL — ABNORMAL LOW (ref 3.87–5.11)
RDW: 13.4 % (ref 11.5–15.5)
WBC: 7.8 10*3/uL (ref 4.0–10.5)
nRBC: 0 % (ref 0.0–0.2)

## 2021-07-09 LAB — GLUCOSE, CAPILLARY
Glucose-Capillary: 181 mg/dL — ABNORMAL HIGH (ref 70–99)
Glucose-Capillary: 197 mg/dL — ABNORMAL HIGH (ref 70–99)
Glucose-Capillary: 177 mg/dL — ABNORMAL HIGH (ref 70–99)
Glucose-Capillary: 137 mg/dL — ABNORMAL HIGH (ref 70–99)
Glucose-Capillary: 137 mg/dL — ABNORMAL HIGH (ref 70–99)
Glucose-Capillary: 126 mg/dL — ABNORMAL HIGH (ref 70–99)

## 2021-07-09 MED ORDER — INSULIN ASPART 100 UNIT/ML IJ SOLN
0.0000 [IU] | INTRAMUSCULAR | Status: DC
  Administered 2021-07-09: 1 [IU] via SUBCUTANEOUS
  Administered 2021-07-09 (×2): 2 [IU] via SUBCUTANEOUS
  Administered 2021-07-09 – 2021-07-10 (×3): 1 [IU] via SUBCUTANEOUS
  Administered 2021-07-10 (×2): 2 [IU] via SUBCUTANEOUS
  Administered 2021-07-10: 1 [IU] via SUBCUTANEOUS
  Administered 2021-07-11: 2 [IU] via SUBCUTANEOUS
  Administered 2021-07-11 (×2): 1 [IU] via SUBCUTANEOUS

## 2021-07-09 MED ORDER — INSULIN ASPART 100 UNIT/ML IJ SOLN
2.0000 [IU] | INTRAMUSCULAR | Status: DC
  Administered 2021-07-09 – 2021-07-11 (×14): 2 [IU] via SUBCUTANEOUS

## 2021-07-09 NOTE — Progress Notes (Signed)
Patient ID: Gwendolyn [name], female   DOB: [birth year], 86 y.o.   MRN: [id number] [facility] Surgery Progress Note  1 Day Post-Op  Subjective: CC-  Only complaint this morning is that she is hungry. Denies abdominal pain, n/v. Tolerating tube feedings per PEG.  Objective: Vital signs in last 24 hours: Temp:  [97.4 F (36.3 C)-98.4 F (36.9 C)] 97.7 F (36.5 C) (05/31 0800) Pulse Rate:  [100-123] 106 (05/31 0800) Resp:  [14-24] 17 (05/31 0800) BP: (97-123)/(59-95) 108/66 (05/31 0800) SpO2:  [93 %-100 %] 100 % (05/31 0800) Weight:  [61.4 kg] 61.4 kg (05/31 0400) Last BM Date : 07/06/21  Intake/Output from previous day: 05/30 0701 - 05/31 0700 In: 350 [I.V.:250; IV Piggyback:100] Out: -  Intake/Output this shift: No intake/output data recorded.  PE: Gen:  Alert, NAD, pleasant Abd: soft, ND, NT, PEG site cdi with TF running at 30cc/hr  Lab Results:  Recent Labs    07/08/21 0044 07/09/21 0219  WBC 10.6* 7.8  HGB 10.5* 9.9*  HCT 31.4* 30.4*  PLT 184 203   BMET Recent Labs    07/08/21 0044 07/09/21 0219  NA 134* 133*  K 3.7 4.3  CL 105 103  CO2 23 23  GLUCOSE 140* 170*  BUN 23 27*  CREATININE 0.71 0.74  CALCIUM 8.0* 8.2*   PT/INR No results for input(s): LABPROT, INR in the last 72 hours. CMP     Component Value Date/Time   NA 133 (L) 07/09/2021 0219   NA 140 03/18/2017 1130   K 4.3 07/09/2021 0219   CL 103 07/09/2021 0219   CO2 23 07/09/2021 0219   GLUCOSE 170 (H) 07/09/2021 0219   BUN 27 (H) 07/09/2021 0219   BUN 27 03/18/2017 1130   CREATININE 0.74 07/09/2021 0219   CALCIUM 8.2 (L) 07/09/2021 0219   PROT 5.3 (L) 07/05/2021 1153   ALBUMIN 2.5 (L) 07/05/2021 1153   AST 52 (H) 07/05/2021 1153   ALT 35 07/05/2021 1153   ALKPHOS 58 07/05/2021 1153   BILITOT 0.2 (L) 07/05/2021 1153   GFRNONAA >60 07/09/2021 0219   GFRAA >60 10/26/2019 1137   Lipase  No results found for: LIPASE     Studies/Results: CT HEAD WO CONTRAST (5MM)  Result  Date: 07/08/2021 CLINICAL DATA:  86 year old female code stroke presentation with right MCA branch occlusion, right MCA territory infarct with some hemorrhagic transformation. EXAM: CT HEAD WITHOUT CONTRAST TECHNIQUE: Contiguous axial images were obtained from the base of the skull through the vertex without intravenous contrast. RADIATION DOSE REDUCTION: This exam was performed according to the departmental dose-optimization program which includes automated exposure control, adjustment of the mA and/or kV according to patient size and/or use of iterative reconstruction technique. COMPARISON:  Brain MRI 07/04/2021 and earlier. FINDINGS: Brain: Patchy and confluent cytotoxic edema throughout much of the right MCA territory, configuration and extent stable from DWI abnormality on 07/04/2021. Right basal ganglia petechial hemorrhage appears stable to mildly regressed since the CT on 07/03/2021. No new hemorrhagic transformation identified. But mass effect on the right lateral ventricle has progressed since 07/03/2021, and there is now trace leftward midline shift of 2 mm. No ventriculomegaly. No IVH. Basilar cisterns remain normal. Stable gray-white matter differentiation in the left hemisphere and posterior fossa. Vascular: Calcified atherosclerosis at the skull base. Skull: No acute osseous abnormality identified. Sinuses/Orbits: Visualized paranasal sinuses and mastoids are stable and well aerated. Other: Left nasoenteric tube in place. No acute orbit or scalp soft tissue finding. IMPRESSION: 1. Relatively large  Right MCA territory infarct with cytotoxic edema. Petechial hemorrhage in the right basal ganglia appears stable to slightly decreased since 07/03/2021. But intracranial mass effect has mildly progressed, including 2 mm of leftward midline shift now. 2. No other acute intracranial abnormality. Electronically Signed   By: Genevie Ann M.D.   On: 07/08/2021 05:41    Anti-infectives: Anti-infectives (From  admission, onward)    Start     Dose/Rate Route Frequency Ordered Stop   07/08/21 0915  ceFAZolin (ANCEF) IVPB 2g/100 mL premix        2 g 200 mL/hr over 30 Minutes Intravenous On call to O.R. 07/08/21 0826 07/08/21 1202        Assessment/Plan R MCA stroke Dysphagia S/p PEG 5/30 Dr. Grandville Silos - PEG site cdi and patient tolerating tube feedings. Ok to increase TF to goal rate. Continue abdominal binder at all times. [place] for anticoagulation if needed from surgical standpoint. We will sign off, please call with questions or concerns.   ID - none FEN - TF  VTE - sq heparin Foley - none   AFib - per cardiology Reasonable rate control for afib, restart anticoagulation when safe    LOS: 6 days    [place], [facility] Surgery 07/09/2021, 9:00 AM Please see Amion for pager number during day hours 7:00am-4:30pm

## 2021-07-09 NOTE — Progress Notes (Signed)
Speech Language Pathology Treatment: Dysphagia  Patient Details Name: Nataliyah Packham MRN: [id number] DOB: 1935/08/06 Today's Date: 07/09/2021 Time: 1005-1030 SLP Time Calculation (min) (ACUTE ONLY): 25 min  Assessment / Plan / Recommendation Clinical Impression  Patient seen by SLP for skilled treatment session focused on dysphagia goals/ PO trials. Patient had reported to RN as well as this SLP that she was feeling hungry. When SLP entered room, patient sitting upright in bed with head turned to right but able to move to midline with tactile and verbal cues. After providing oral care, SLP assessed patient's toleration with small spoon sips of honey thick coffee and 1/2 spoon bites of puree (applesauce). Patient was able to feed self using right hand and although impulsive, exhibited trace to mild amount of anterior spillage of puree on left side. Patient continues with significantly reduced lingual movement during PO intake and swallow initiation fluctuates between mildly delayed to moderately delayed. No overt s/s aspiration or penetration observed and patient's vocal quality and vital signs did not change. No oral residuals observed when SLP inspecting following PO intake. Patient is demonstrating progress with her swallow function. SLP is recommending one more PO trial with SLP prior to making any changes in PO recommendations. Per recent notes in chart, patient is tolerating PEG feedings well.    HPI HPI: Patient is an 86 y.o. female with PMH: a fib not on anticougulation, HTN, syncope. She lives in a hotel and presented to [facility] ED after being found by hotel staff with AMS. Per report, last known well was two days prior. She presented to ED with left side weakness, left side facial droop and slurred speech. CT head showed acute right basal ganglia hemorrhage. She failed the [place] swallow screen with nursing and has been NPO. MBS on 5/26 recommending continue NPO but trials of honey thick liquids and  puree solids with SLP.      SLP Plan  Continue with current plan of care      Recommendations for follow up therapy are one component of a multi-disciplinary discharge planning process, led by the attending physician.  Recommendations may be updated based on patient status, additional functional criteria and insurance authorization.    Recommendations  Diet recommendations: NPO Medication Administration: Via alternative means                Oral Care Recommendations: Oral care QID;Staff/trained caregiver to provide oral care Follow Up Recommendations: Skilled nursing-short term rehab (<3 hours/day) Assistance recommended at discharge: Frequent or constant Supervision/Assistance SLP Visit Diagnosis: Dysphagia, oropharyngeal phase (R13.12) Plan: Continue with current plan of care          Angela Nevin, MA, CCC-SLP Speech Therapy

## 2021-07-09 NOTE — TOC Progression Note (Signed)
Transition of Care [facility]) - Progression Note    Patient Details  Name: Gwendolyn [name] MRN: [id number] Date of Birth: 1935/04/29  [facility]) CM/SW Contact  Mearl Latin, LCSW Phone Number: 07/09/2021, 3:50 PM  Clinical Narrative:    CSW provided SNF bed offers to patient with ratings list. She requested CSW check if [facility] her. [facility] reviewing. Patient reported agreement for CSW to contact her niece.  CSW spoke with patient's niece, [name]. She thanked CSW for update and [place]. She asked CSW questions about long term assistance if needed and CSW emailed her Medicaid application and information.    Expected Discharge Plan: Skilled Nursing Facility Barriers to Discharge: Continued Medical Work up  Expected Discharge Plan and Services Expected Discharge Plan: Skilled Nursing Facility In-house Referral: Clinical Social Work     Living arrangements for the past 2 months: Hotel/Motel                                       Social Determinants of Health (SDOH) Interventions    Readmission Risk Interventions     View : No data to display.

## 2021-07-09 NOTE — Progress Notes (Signed)
Inpatient Diabetes Program Recommendations  AACE/ADA: New Consensus Statement on Inpatient Glycemic Control   Target Ranges:  Prepandial:   less than 140 mg/dL      Peak postprandial:   less than 180 mg/dL (1-2 hours)      Critically ill patients:  140 - 180 mg/dL    Latest Reference Range & Units 07/08/21 08:15 07/08/21 16:10 07/08/21 21:15 07/08/21 23:25 07/09/21 04:44 07/09/21 08:10  Glucose-Capillary 70 - 99 mg/dL 117 (H) 127 (H) 158 (H) 173 (H) 181 (H) 197 (H)   Review of Glycemic Control  Diabetes history: No Outpatient Diabetes medications: NA Current orders for Inpatient glycemic control: Novolog 0-9 units Q4H; Osmolite @ 40 ml/hr  Inpatient Diabetes Program Recommendations:    Insulin: Please consider ordering Novolog 2 units Q4H for tube feeding coverage. If tube feeding is stopped or held then Novolog tube feeding coverage should also be stopped or held.  NOTE: Received consult for "patient with a1c 5.7 now hyperglycemic with tube feedings started". Chart reviewed. Patient does not have DM hx. Patient admitted with CVA, had PEG tube yesterday, ordered Osmolite @ 40 ml/hr and received Decadron 4 mg at 11:56 on 07/08/21.    Thanks, Barnie Alderman, RN, MSN, [place] Diabetes Coordinator Inpatient Diabetes Program [phone] (Team Pager from 8am to [place])

## 2021-07-09 NOTE — Progress Notes (Addendum)
STROKE TEAM PROGRESS NOTE   INTERVAL HISTORY Patient is seen in her room with no family at the bedside.  Her neurological exam is stable today and her vital signs are stable.  PEG tube was placed yesterday and she is tolerating tube feedings well.  Vitals:   07/09/21 0400 07/09/21 0449 07/09/21 0800 07/09/21 1241  BP: (!) 97/59 112/72 108/66 102/64  Pulse: (!) 111 (!) 110 (!) 106 (!) 120  Resp: 16 20 17 20   Temp: 98.4 F (36.9 C) 98.4 F (36.9 C) 97.7 F (36.5 C) 97.8 F (36.6 C)  TempSrc: Axillary Axillary Oral Oral  SpO2: 100% 100% 100% 95%  Weight: 61.4 kg     Height:       CBC:  Recent Labs  Lab 07/03/21 1147 07/04/21 0040 07/08/21 0044 07/09/21 0219  WBC 16.7*   < > 10.6* 7.8  NEUTROABS 14.5*  --   --   --   HGB 13.9   < > 10.5* 9.9*  HCT 41.9   < > 31.4* 30.4*  MCV 98.8   < > 98.7 97.7  PLT 236   < > 184 203   < > = values in this interval not displayed.    Basic Metabolic Panel:  Recent Labs  Lab 07/06/21 0506 07/06/21 1627 07/07/21 0117 07/08/21 0044 07/09/21 0219  NA 136  --  135 134* 133*  K 3.7  --  3.7 3.7 4.3  CL 105  --  107 105 103  CO2 21*  --  23 23 23   GLUCOSE 140*  --  142* 140* 170*  BUN 25*  --  23 23 27*  CREATININE 0.78  --  0.67 0.71 0.74  CALCIUM 8.0*  --  7.7* 8.0* 8.2*  MG 1.9 1.9  --   --   --   PHOS 1.6* 1.8* 2.0*  --   --     Lipid Panel:  Recent Labs  Lab 07/04/21 0040  CHOL 226*  TRIG 80  HDL 88  CHOLHDL 2.6  VLDL 16  LDLCALC 122*    HgbA1c:  Recent Labs  Lab 07/04/21 0040  HGBA1C 5.7*    Urine Drug Screen:  Recent Labs  Lab 07/03/21 1626  LABOPIA NONE DETECTED  COCAINSCRNUR NONE DETECTED  LABBENZ NONE DETECTED  AMPHETMU NONE DETECTED  THCU NONE DETECTED  LABBARB NONE DETECTED     Alcohol Level No results for input(s): ETH in the last 168 hours.  IMAGING past 24 hours No results found.  PHYSICAL EXAM General:  Alert, well-developed, well-nourished elderly Caucasian lady in no acute  distress Respiratory:  Regular, unlabored respirations on room air  NEURO:  Mental Status: AA&Ox3 .  Gaze deviation to the right, can cross midline with effort Speech/Language: speech is without dysarthria or aphasia.  Fluency, and comprehension intact.  Cranial Nerves:  II: PERRL. Visual fields showed decreased blink to threat on the left III, IV, VI: right gaze deviation able to cross midline with effort V: Sensation is intact to light touch and symmetrical to face with extinction to DSS on left side VII: left sided facial droop  VIII: hearing intact to voice. IX, X: Phonation is normal.  XII: tongue is midline without fasciculations. Motor: 5/5 strength to RUE and RLE, 1/5 to LUE, 1/5 to LLE Sensation- Intact to light touch on right and in LLE.  Impaired on LUE Extinction present to DSS on the left  Coordination: FTN intact on right Gait- deferred   ASSESSMENT/PLAN Ms. Fonnie Mu  Hundt is a 86 y.o. female with history of atrial fibrillation not on anticoagulation, HTN and syncope presenting with altered mental status, left sided facial droop, slurred speech and left sided weakness.  She was found to have an acute stroke in the right basal ganglia with hemorrhagic transformation.  She was noted to be in a-fib with RVR and was initially treated with a diltiazem gtt then switched to amiodarone due to hypotension.  Stroke:  right MCA large infarct with hemorrhagic transformation likely secondary due to embolism in setting of atrial fibrillation not on anticoagulation CT head multiple acute hemorrhagic infarct in right basal ganglia CTA head & neck distal right M1 MCA stenosis and occlusion of distal right M2 MCA MRI large right MCA infarct with HT at right BG and caudate CT head 5/30 Relatively large Right MCA territory infarct with cytotoxic edema. Petechial hemorrhage in the right BG appears stable to slightly decreased since 07/03/2021. But intracranial mass effect has mildly  progressed, including 2 mm of leftward midline shift now. 2D Echo EF 55%, moderately dilated left atrium, no atrial level shunt LDL 122 HgbA1c 5.7 VTE prophylaxis - heparin subcu aspirin 81 mg daily prior to admission, now on No antithrombotic due to HT and large infarct size.   Therapy recommendations:  SNF Disposition:  pending  Atrial fibrillation with RVR Patient is in a-fib RVR with rates up to the 160s Not on anticoagulation at home Cardiology on board, appreciate help Was initially treated with diltiazem, now on metoprolol 25 mg every 8h IV amiodarone switched to p.o. Now HR 100-120s  Hypertension Home meds:  carvedilol 6.25 mg BID, lisinopril 40 mg daily Stable on the low end Now on metoprolol Long-term BP goal normotensive  Hyperlipidemia Home meds:  none LDL 122, goal < 70 Add atorvastatin 40 mg daily  Continue statin at discharge  Dysphagia Did not pass swallow Speech on board, agree with PEG as it will be some time before she can swallow safely  PEG done 5/30 with trauma service Continue tube feeding  Other Stroke Risk Factors Advanced Age >/= 59   Other Active Problems Syncope Leukocytosis WBC 14.1-> 9.9-> 8.5->10.6-> 7.8-> resolved Elevated AST 85->52  Hospital day # 6  Patient seen and examined by NP/APP with MD. MD to update note as needed.   [place] , MSN, AGACNP-BC [facility] See Amion for schedule and pager information 07/09/2021 2:27 PM   ATTENDING NOTE: I reviewed above note and agree with the assessment and plan.   No acute event overnight.  Neuro stable still has left facial droop and left hemiplegia and right gaze preference.  Heart rate 100-120s.  On metoprolol.  Tolerating PEG tube and tube feeding.  SNF placement pending.  For detailed assessment and plan, please refer to above as I have made changes wherever appropriate.   [name], MD PhD Stroke Neurology 07/09/2021 6:58 PM    To contact Stroke  Continuity provider, please refer to [email or web address] After hours, contact General Neurology

## 2021-07-10 ENCOUNTER — Inpatient Hospital Stay (HOSPITAL_COMMUNITY): Payer: Medicare Other

## 2021-07-10 DIAGNOSIS — R4182 Altered mental status, unspecified: Secondary | ICD-10-CM | POA: Diagnosis not present

## 2021-07-10 DIAGNOSIS — I4819 Other persistent atrial fibrillation: Secondary | ICD-10-CM | POA: Diagnosis not present

## 2021-07-10 DIAGNOSIS — R001 Bradycardia, unspecified: Secondary | ICD-10-CM

## 2021-07-10 DIAGNOSIS — I6389 Other cerebral infarction: Secondary | ICD-10-CM | POA: Diagnosis not present

## 2021-07-10 LAB — CBC WITH DIFFERENTIAL/PLATELET
Abs Immature Granulocytes: 0.07 10*3/uL (ref 0.00–0.07)
Basophils Absolute: 0 10*3/uL (ref 0.0–0.1)
Basophils Relative: 0 %
Eosinophils Absolute: 0 10*3/uL (ref 0.0–0.5)
Eosinophils Relative: 0 %
HCT: 24.2 % — ABNORMAL LOW (ref 36.0–46.0)
Hemoglobin: 8.3 g/dL — ABNORMAL LOW (ref 12.0–15.0)
Immature Granulocytes: 1 %
Lymphocytes Relative: 13 %
Lymphs Abs: 1 10*3/uL (ref 0.7–4.0)
MCH: 34 pg (ref 26.0–34.0)
MCHC: 34.3 g/dL (ref 30.0–36.0)
MCV: 99.2 fL (ref 80.0–100.0)
Monocytes Absolute: 1 10*3/uL (ref 0.1–1.0)
Monocytes Relative: 14 %
Neutro Abs: 5.2 10*3/uL (ref 1.7–7.7)
Neutrophils Relative %: 72 %
Platelets: 197 10*3/uL (ref 150–400)
RBC: 2.44 MIL/uL — ABNORMAL LOW (ref 3.87–5.11)
RDW: 13.5 % (ref 11.5–15.5)
WBC: 7.3 10*3/uL (ref 4.0–10.5)
nRBC: 0 % (ref 0.0–0.2)

## 2021-07-10 LAB — MAGNESIUM: Magnesium: 1.7 mg/dL (ref 1.7–2.4)

## 2021-07-10 LAB — GLUCOSE, CAPILLARY
Glucose-Capillary: 116 mg/dL — ABNORMAL HIGH (ref 70–99)
Glucose-Capillary: 117 mg/dL — ABNORMAL HIGH (ref 70–99)
Glucose-Capillary: 124 mg/dL — ABNORMAL HIGH (ref 70–99)
Glucose-Capillary: 126 mg/dL — ABNORMAL HIGH (ref 70–99)
Glucose-Capillary: 127 mg/dL — ABNORMAL HIGH (ref 70–99)
Glucose-Capillary: 144 mg/dL — ABNORMAL HIGH (ref 70–99)
Glucose-Capillary: 172 mg/dL — ABNORMAL HIGH (ref 70–99)

## 2021-07-10 LAB — COMPREHENSIVE METABOLIC PANEL
ALT: 40 U/L (ref 0–44)
AST: 52 U/L — ABNORMAL HIGH (ref 15–41)
Albumin: 1.8 g/dL — ABNORMAL LOW (ref 3.5–5.0)
Alkaline Phosphatase: 70 U/L (ref 38–126)
Anion gap: 5 (ref 5–15)
BUN: 31 mg/dL — ABNORMAL HIGH (ref 8–23)
CO2: 23 mmol/L (ref 22–32)
Calcium: 7.1 mg/dL — ABNORMAL LOW (ref 8.9–10.3)
Chloride: 111 mmol/L (ref 98–111)
Creatinine, Ser: 0.72 mg/dL (ref 0.44–1.00)
GFR, Estimated: >60 mL/min (ref >60)
Glucose, Bld: 102 mg/dL — ABNORMAL HIGH (ref 70–99)
Potassium: 3.6 mmol/L (ref 3.5–5.1)
Sodium: 139 mmol/L (ref 135–145)
Total Bilirubin: 0.2 mg/dL — ABNORMAL LOW (ref 0.3–1.2)
Total Protein: 4.3 g/dL — ABNORMAL LOW (ref 6.5–8.1)

## 2021-07-10 MED ORDER — SODIUM CHLORIDE 0.9 % IV BOLUS
500.0000 mL | Freq: Once | INTRAVENOUS | Status: AC | PRN
  Administered 2021-07-10: 500 mL via INTRAVENOUS

## 2021-07-10 MED ORDER — ASPIRIN 81 MG PO CHEW
81.0000 mg | CHEWABLE_TABLET | Freq: Every day | ORAL | Status: DC
  Administered 2021-07-10 – 2021-07-11 (×2): 81 mg
  Filled 2021-07-10 (×2): qty 1

## 2021-07-10 MED ORDER — METOPROLOL TARTRATE 25 MG/10 ML ORAL SUSPENSION
12.5000 mg | Freq: Three times a day (TID) | ORAL | Status: DC
  Administered 2021-07-10 – 2021-07-11 (×5): 12.5 mg
  Filled 2021-07-10 (×7): qty 5

## 2021-07-10 MED ORDER — SODIUM CHLORIDE 0.9 % IV BOLUS
250.0000 mL | Freq: Once | INTRAVENOUS | Status: AC
Start: 2021-07-10 — End: 2021-07-10
  Administered 2021-07-10: 250 mL via INTRAVENOUS

## 2021-07-10 MED ORDER — SODIUM CHLORIDE 0.9 % IV SOLN: INTRAVENOUS | Status: AC

## 2021-07-10 NOTE — Progress Notes (Signed)
EEG complete - results pending 

## 2021-07-10 NOTE — Progress Notes (Signed)
Speech Language Pathology Treatment: Dysphagia  Patient Details Name: Gwendolyn [name] MRN: [id number] DOB: 1935-08-16 Today's Date: 07/10/2021 Time: 1340-1400 SLP Time Calculation (min) (ACUTE ONLY): 20 min  Assessment / Plan / Recommendation Clinical Impression  Gwendolyn [name] demonstrates flat affect and decreased initiation, but good ability to follow commands and witty commentary if engaged in interesting conversation. Pt enjoys puddings today, needs constant verbal cues to find a visual anchor at midline while taking spoonfuls from SLP. Verbal cues also helpful to elicit completely open mouth for bolus placement on the midlingual surface and not at the front of the mouth. Pt does have some oral residue with pudding that she cannot organize to swallow and clear despite cueing. Honey thick liquids via spoon caused delayed coughing. Recommend pt initiate pudding and puree only, no liquids yet. Will follow for tolerance.   HPI HPI: Patient is an 86 y.o. female with PMH: a fib not on anticougulation, HTN, syncope. She lives in a hotel and presented to [facility] ED after being found by hotel staff with AMS. Per report, last known well was two days prior. She presented to ED with left side weakness, left side facial droop and slurred speech. CT head showed acute right basal ganglia hemorrhage. She failed the Yale swallow screen with nursing and has been NPO. MBS on 5/26 recommending continue NPO but trials of honey thick liquids and puree solids with SLP.      SLP Plan  Continue with current plan of care      Recommendations for follow up therapy are one component of a multi-disciplinary discharge planning process, led by the attending physician.  Recommendations may be updated based on patient status, additional functional criteria and insurance authorization.    Recommendations  Diet recommendations: Dysphagia 1 (puree) Medication Administration: Via alternative means                Oral Care  Recommendations: Oral care BID Follow Up Recommendations: Skilled nursing-short term rehab (<3 hours/day) Assistance recommended at discharge: Frequent or constant Supervision/Assistance SLP Visit Diagnosis: Dysphagia, oropharyngeal phase (R13.12) Plan: Continue with current plan of care         Harlon Ditty, MA CCC-SLP  Acute Rehabilitation Services Secure Chat Preferred Office [phone]   Gwendolyn [name]  07/10/2021, 2:11 PM

## 2021-07-10 NOTE — Progress Notes (Addendum)
STROKE TEAM PROGRESS NOTE   INTERVAL HISTORY Patient is seen in her room with no family at the bedside.  Her neurological exam is stable today.  PEG tube was placed 5/30 and she is tolerating tube feedings well. Episode of bradycardia overnight, metoprolol decreased by nightshift team, EEG ordered. HR is 110 on exam and BP is 106/72. Will touch base with cardiology for final med recs prior to discharge.   Vitals:   07/10/21 1100 07/10/21 1200 07/10/21 1213 07/10/21 1300  BP:  106/72 112/67   Pulse: (!) 104 (!) 112 (!) 113   Resp: (!) 26 20 18 15   Temp:   98.7 F (37.1 C)   TempSrc:   Oral   SpO2: 98% 95% 99%   Weight:      Height:       CBC:  Recent Labs  Lab 07/09/21 0219 07/10/21 0110  WBC 7.8 7.3  NEUTROABS  --  5.2  HGB 9.9* 8.3*  HCT 30.4* 24.2*  MCV 97.7 99.2  PLT 203 [id number]    Basic Metabolic Panel:  Recent Labs  Lab 07/06/21 1627 07/07/21 0117 07/08/21 0044 07/09/21 0219 07/10/21 0110  NA  --  135   < > 133* 139  K  --  3.7   < > 4.3 3.6  CL  --  107   < > 103 111  CO2  --  23   < > 23 23  GLUCOSE  --  142*   < > 170* 102*  BUN  --  23   < > 27* 31*  CREATININE  --  0.67   < > 0.74 0.72  CALCIUM  --  7.7*   < > 8.2* 7.1*  MG 1.9  --   --   --  1.7  PHOS 1.8* 2.0*  --   --   --    < > = values in this interval not displayed.    Lipid Panel:  Recent Labs  Lab 07/04/21 0040  CHOL 226*  TRIG 80  HDL 88  CHOLHDL 2.6  VLDL 16  LDLCALC 122*    HgbA1c:  Recent Labs  Lab 07/04/21 0040  HGBA1C 5.7*    Urine Drug Screen:  Recent Labs  Lab 07/03/21 1626  LABOPIA NONE DETECTED  COCAINSCRNUR NONE DETECTED  LABBENZ NONE DETECTED  AMPHETMU NONE DETECTED  THCU NONE DETECTED  LABBARB NONE DETECTED     Alcohol Level No results for input(s): ETH in the last 168 hours.  IMAGING past 24 hours CT HEAD WO CONTRAST (5MM)  Result Date: 07/10/2021 CLINICAL DATA:  Initial evaluation for acute altered mental status, stroke. EXAM: CT HEAD WITHOUT CONTRAST  TECHNIQUE: Contiguous axial images were obtained from the base of the skull through the vertex without intravenous contrast. RADIATION DOSE REDUCTION: This exam was performed according to the departmental dose-optimization program which includes automated exposure control, adjustment of the mA and/or kV according to patient size and/or use of iterative reconstruction technique. COMPARISON:  Prior CT from 07/08/2021. FINDINGS: Brain: Continued interval evolution of subacute right MCA distribution infarct, relatively stable in size and distribution as compared to previous exams associated regional mass effect with trace 2 mm right-to-left shift, relatively stable. No hydrocephalus or trapping. Basilar cisterns remain patent. Associated petechial hemorrhage at the right basal ganglia again noted, slightly less conspicuous as compared to previous exam. No new hemorrhage. No other new acute large vessel territory infarct. No other acute intracranial hemorrhage. Underlying atrophy with chronic small vessel  ischemic disease again noted. No extra-axial fluid collection. No visible mass lesion. Vascular: No hyperdense vessel. Calcified atherosclerosis present about the skull base. Skull: Scalp soft tissues and calvarium demonstrate no new abnormality. Sinuses/Orbits: Globes orbital soft tissues within normal limits. Paranasal sinuses and mastoid air cells remain largely clear. Other: None. IMPRESSION: 1. Continued interval evolution of large subacute right MCA distribution infarct, relatively stable in size and distribution as compared to previous. Associated petechial hemorrhage at the right basal ganglia is slightly less conspicuous as compared to prior. No new hemorrhage. Associated regional mass effect with trace 2 mm right-to-left shift, relatively stable. No hydrocephalus or trapping. 2. No other new acute intracranial abnormality. Electronically Signed   By: Gwendolyn [name] M.D.   On: 07/10/2021 02:22   EEG  adult  Result Date: 07/10/2021 Gwendolyn Havens, MD     07/10/2021  1:05 PM [name] MRN: [id number] Epilepsy Attending: Lora [name] Referring Physician/Provider: Lorenza Chick, MD Date: 07/10/2021 Duration: 23.50 mins Patient history: 86 year old female with right MCA infarct with an episode of right gaze preference and alteration of awareness.  EEG to evaluate for seizure. Level of alertness: Awake AEDs during EEG study: None Technical aspects: This EEG study was done with scalp electrodes positioned according to the 10-20 International system of electrode placement. Electrical activity was acquired at a sampling rate of 500Hz  and reviewed with a high frequency filter of 70Hz  and a low frequency filter of 1Hz . EEG data were recorded continuously and digitally stored. Description: No clear posterior dominant rhythm was seen.  EEG showed continuous generalized 3 to 6 Hz theta Parenta slowing admixed with 15 to 18 Hz on peak activity.  Additionally there is intermittent rhythmic sharply contoured 2 to 3 Hz delta slowing in right hemisphere, maximal bifrontal region.  Hyperventilation and photic stimulation were not performed.   ABNORMALITY -Continuous slow, generalized -Frontal intermittent rhythmic delta slow, right frontal region IMPRESSION: This study is suggestive of cortical dysfunction in right hemisphere, maximal right frontal region likely secondary to underlying stroke.  Additionally there is moderate diffuse encephalopathy, nonspecific etiology. No seizures or definite epileptiform discharges were seen throughout the recording. [place]    PHYSICAL EXAM General:  Alert, well-developed, well-nourished elderly Caucasian lady in no acute distress Respiratory:  Regular, unlabored respirations on room air  NEURO:  Mental Status: AA&Ox3 .  Gaze deviation to the right, can cross midline with effort Speech/Language: speech is without dysarthria or aphasia.  Fluency, and  comprehension intact.  Cranial Nerves:  II: PERRL. Visual fields showed decreased blink to threat on the left III, IV, VI: right gaze deviation able to cross midline with effort V: Sensation is intact to light touch and symmetrical to face with extinction to DSS on left side VII: left sided facial droop  VIII: hearing intact to voice. IX, X: Phonation is normal.  XII: tongue is midline without fasciculations. Motor: 5/5 strength to RUE and RLE, 1/5 to LUE, 1/5 to LLE Sensation- Intact to light touch on right and in LLE.  Impaired on LUE Extinction present to DSS on the left  Coordination: FTN intact on right Gait- deferred   ASSESSMENT/PLAN Ms. Steffani Bon is a 86 y.o. female with history of atrial fibrillation not on anticoagulation, HTN and syncope presenting with altered mental status, left sided facial droop, slurred speech and left sided weakness.  She was found to have an acute stroke in the right basal ganglia with hemorrhagic transformation.  She was noted  to be in a-fib with RVR and was initially treated with a diltiazem gtt then switched to amiodarone due to hypotension.  Stroke:  right MCA large infarct with hemorrhagic transformation likely secondary due to embolism in setting of atrial fibrillation not on anticoagulation CT head multiple acute hemorrhagic infarct in right basal ganglia CTA head & neck distal right M1 MCA stenosis and occlusion of distal right M2 MCA MRI large right MCA infarct with HT at right BG and caudate CT head 5/30 Relatively large Right MCA territory infarct with cytotoxic edema. Petechial hemorrhage in the right BG appears stable to slightly decreased since 07/03/2021. But intracranial mass effect has mildly progressed, including 2 mm of leftward midline shift now. CT 07/10/21 stable evolving right large MCA infarct and mass effect, HT less conspicuous as compared to prior 2D Echo EF 55%, moderately dilated left atrium, no atrial level shunt LDL  122 HgbA1c 5.7 VTE prophylaxis - heparin subcu aspirin 81 mg daily prior to admission, now on ASA 81.  Recommend to start eliquis 2.5mg  bid in 7 days at [facility] (07/16/21). Therapy recommendations:  SNF Disposition:  pending  Atrial fibrillation with RVR Patient is in a-fib RVR with rates up to the 160s Not on anticoagulation at home Cardiology on board, appreciate help Was initially treated with diltiazem, now on metoprolol 25 mg Q8->12.5mg  Q8 IV amiodarone switched to p.o. Now HR 100-120s  Vasovagal reaction  5/30 overnight bradycardia, hypotension with nauesa and diaphoresis  CT 07/10/21 stable evolving large infarct with HT, no significant change Recovery was quick and hypotension corrected with IV fluids.  Metoprolol was decreased from 25 mg q8 to 12.5 mg q8. Dr Debara Pickett cardiology considered vasovagal event  Hypertension Home meds:  carvedilol 6.25 mg BID, lisinopril 40 mg daily Stable on the low end Now on metoprolol  Long-term BP goal normotensive  Hyperlipidemia Home meds:  none LDL 122, goal < 70 Add atorvastatin 40 mg daily  Continue statin at discharge  Anemia  Hb 11.5-10.1-9.9-8.3 Check iron level, ferritin, B12 and folate Check stool occult blood CBC monitoring  Dysphagia Did not pass swallow Speech on board, agree with PEG as it will be some time before she can swallow safely  PEG done 5/30 with trauma service Continue tube feeding  Other Stroke Risk Factors Advanced Age >/= 23   Other Active Problems Syncope Leukocytosis WBC 14.1-> 9.9-> 8.5->10.6-> 7.8-> 7.3 resolved Elevated AST 85->52  Hospital day # 7   Patient seen and examined by NP/APP with MD. MD to update note as needed.   Janine Ores, DNP, FNP-BC [facility] Pager: [phone]  ATTENDING NOTE: I reviewed above note and agree with the assessment and plan. Pt was seen and examined.   No family at bedside, patient speech therapist and RN at bedside.  Patient awake alert,  able to eat applesauce with speech therapist.  Heart rate 110s, BP 100s, neuro stable unchanged.  Reported overnight episode of bradycardia, hypotension, nausea and diaphoresis.  EKG, CXR, CT head negative.  Concerning for vasovagal event.  Metoprolol down from 25 to 12.5 every 8 hours.  Cardiology recommendation appreciated.  Patient tolerating PEG tube without difficulty, recommend Eliquis 2.5 mg twice daily in 7 days at [facility].  Pending SNF placement.  For detailed assessment and plan, please refer to above as I have made changes wherever appropriate.   [name], MD PhD Stroke Neurology 07/10/2021 7:13 PM     To contact Stroke Continuity provider, please refer to [email or web address] After hours, contact  General Neurology

## 2021-07-10 NOTE — Progress Notes (Signed)
RN noted that patient suddenly became bradycardic on the monitor. Upon assessment of patient, patient was unresponsive and clammy/diaphoretic. She was not answering questions and would only moan when this RN questioned her. Patient was at baseline about 30 mins prior to becoming bradycardic as this RN was cleaning patient. Given patient's symptoms and sudden decrease in LOC, rapid response was called.

## 2021-07-10 NOTE — Progress Notes (Signed)
DAILY PROGRESS NOTE   [name] Date of Encounter: 07/10/2021 Cardiologist: Pixie Casino, MD  Chief Complaint   Nocturnal hypotension, bradycardia  Patient Profile   Gwendolyn [name] is a 86 y.o. female with a hx of atrial fib from 03/2017 placed on Eliquis but stopped sometime thereafter and  never returned for followup, hx of syncope who is being seen 07/05/2021 after admit for AMS found to have multiple acute hemorrhages in Rt basal ganglia with associated edema without mass effect for the evaluation of atrial fib at the request of Dr Juliann Mule.  Subjective   Asked to re-round on patient with afib, who developed bradycardia and hypotension overnight. I reviewed the telemetry monitor which demonstrated afib with slow venticular response in the 50's - there also appears to be some borderline ST elevation, however, this resolved and she has returned to afib with RVR. No EKG was performed overnight. Blood pressure at the time was in the 99991111 systolic, but has come back up to the A999333 systolic. She was described as unresponsive and clammy/diaphoretic overnight - rapid response was called. Metropolol dose ws reduced and IV fluids were given.  Objective   Vitals:   07/10/21 1213 07/10/21 1300 07/10/21 1400 07/10/21 1500  BP: 112/67     Pulse: (!) 113     Resp: 18 15 (!) 23 17  Temp: 98.7 F (37.1 C)     TempSrc: Oral     SpO2: 99%     Weight:      Height:        Intake/Output Summary (Last 24 hours) at 07/10/2021 1609 Last data filed at 07/10/2021 0800 Gross per 24 hour  Intake --  Output 500 ml  Net -500 ml   Filed Weights   07/06/21 0500 07/08/21 0400 07/09/21 0400  Weight: 60.7 kg 64.2 kg 61.4 kg    Physical Exam   General appearance: alert and no distress Lungs: clear to auscultation bilaterally Heart: irregularly irregular rhythm Extremities: extremities normal, atraumatic, no cyanosis or edema Neurologic: Mental status: left facial droop,  right gaze preference  Inpatient Medications    Scheduled Meds:  amiodarone  200 mg Per Tube BID   atorvastatin  40 mg Per Tube Daily   chlorhexidine  15 mL Mouth Rinse BID   feeding supplement (PROSource TF)  45 mL Per Tube BID   heparin injection (subcutaneous)  5,000 Units Subcutaneous Q12H   insulin aspart  0-9 Units Subcutaneous Q4H   insulin aspart  2 Units Subcutaneous Q4H   mouth rinse  15 mL Mouth Rinse q12n4p   metoprolol tartrate  12.5 mg Per Tube Q8H   multivitamin with minerals  1 tablet Per Tube Daily   pantoprazole sodium  40 mg Per Tube QHS   polyethylene glycol  17 g Per Tube Daily   sennosides  10 mL Per Tube BID    Continuous Infusions:  feeding supplement (OSMOLITE 1.5 CAL) 1,000 mL (07/10/21 0405)    PRN Meds: acetaminophen **OR** acetaminophen (TYLENOL) oral liquid 160 mg/5 mL **OR** acetaminophen, baclofen, labetalol   Labs   Results for orders placed or performed during the hospital encounter of 07/03/21 (from the past 48 hour(s))  Glucose, capillary     Status: Abnormal   Collection Time: 07/08/21  4:10 PM  Result Value Ref Range   Glucose-Capillary 127 (H) 70 - 99 mg/dL    Comment: Glucose reference range applies only to samples taken after fasting for at least 8 hours.  Glucose,  capillary     Status: Abnormal   Collection Time: 07/08/21  9:15 PM  Result Value Ref Range   Glucose-Capillary 158 (H) 70 - 99 mg/dL    Comment: Glucose reference range applies only to samples taken after fasting for at least 8 hours.  Glucose, capillary     Status: Abnormal   Collection Time: 07/08/21 11:25 PM  Result Value Ref Range   Glucose-Capillary 173 (H) 70 - 99 mg/dL    Comment: Glucose reference range applies only to samples taken after fasting for at least 8 hours.  Basic metabolic panel     Status: Abnormal   Collection Time: 07/09/21  2:19 AM  Result Value Ref Range   Sodium 133 (L) 135 - 145 mmol/L   Potassium 4.3 3.5 - 5.1 mmol/L   Chloride 103 98 -  111 mmol/L   CO2 23 22 - 32 mmol/L   Glucose, Bld 170 (H) 70 - 99 mg/dL    Comment: Glucose reference range applies only to samples taken after fasting for at least 8 hours.   BUN 27 (H) 8 - 23 mg/dL   Creatinine, Ser 5.61 0.44 - 1.00 mg/dL   Calcium 8.2 (L) 8.9 - 10.3 mg/dL   GFR, Estimated >53 >79 mL/min    Comment: (NOTE) Calculated using the CKD-EPI Creatinine Equation (2021)    Anion gap 7 5 - 15    Comment: Performed at [facility], [address], [place], [place] [address]  CBC     Status: Abnormal   Collection Time: 07/09/21  2:19 AM  Result Value Ref Range   WBC 7.8 4.0 - 10.5 K/uL   RBC 3.11 (L) 3.87 - 5.11 MIL/uL   Hemoglobin 9.9 (L) 12.0 - 15.0 g/dL   HCT 14.7 (L) 09.2 - 95.7 %   MCV 97.7 80.0 - 100.0 fL   MCH 31.8 26.0 - 34.0 pg   MCHC 32.6 30.0 - 36.0 g/dL   RDW 47.3 40.3 - 70.9 %   Platelets 203 150 - 400 K/uL   nRBC 0.0 0.0 - 0.2 %    Comment: Performed at [facility], [address], [place], [place] [address]  Glucose, capillary     Status: Abnormal   Collection Time: 07/09/21  4:44 AM  Result Value Ref Range   Glucose-Capillary 181 (H) 70 - 99 mg/dL    Comment: Glucose reference range applies only to samples taken after fasting for at least 8 hours.  Glucose, capillary     Status: Abnormal   Collection Time: 07/09/21  8:10 AM  Result Value Ref Range   Glucose-Capillary 197 (H) 70 - 99 mg/dL    Comment: Glucose reference range applies only to samples taken after fasting for at least 8 hours.  Glucose, capillary     Status: Abnormal   Collection Time: 07/09/21 12:40 PM  Result Value Ref Range   Glucose-Capillary 177 (H) 70 - 99 mg/dL    Comment: Glucose reference range applies only to samples taken after fasting for at least 8 hours.  Glucose, capillary     Status: Abnormal   Collection Time: 07/09/21  5:39 PM  Result Value Ref Range   Glucose-Capillary 137 (H) 70 - 99 mg/dL    Comment: Glucose reference range applies only to samples taken  after fasting for at least 8 hours.  Glucose, capillary     Status: Abnormal   Collection Time: 07/09/21  8:46 PM  Result Value Ref Range  Glucose-Capillary 137 (H) 70 - 99 mg/dL    Comment: Glucose reference range applies only to samples taken after fasting for at least 8 hours.  Glucose, capillary     Status: Abnormal   Collection Time: 07/09/21 11:20 PM  Result Value Ref Range   Glucose-Capillary 126 (H) 70 - 99 mg/dL    Comment: Glucose reference range applies only to samples taken after fasting for at least 8 hours.  Glucose, capillary     Status: Abnormal   Collection Time: 07/10/21 12:44 AM  Result Value Ref Range   Glucose-Capillary 116 (H) 70 - 99 mg/dL    Comment: Glucose reference range applies only to samples taken after fasting for at least 8 hours.  CBC with Differential/Platelet     Status: Abnormal   Collection Time: 07/10/21  1:10 AM  Result Value Ref Range   WBC 7.3 4.0 - 10.5 K/uL   RBC 2.44 (L) 3.87 - 5.11 MIL/uL   Hemoglobin 8.3 (L) 12.0 - 15.0 g/dL   HCT 24.2 (L) 36.0 - 46.0 %   MCV 99.2 80.0 - 100.0 fL   MCH 34.0 26.0 - 34.0 pg   MCHC 34.3 30.0 - 36.0 g/dL   RDW 13.5 11.5 - 15.5 %   Platelets 197 150 - 400 K/uL   nRBC 0.0 0.0 - 0.2 %   Neutrophils Relative % 72 %   Neutro Abs 5.2 1.7 - 7.7 K/uL   Lymphocytes Relative 13 %   Lymphs Abs 1.0 0.7 - 4.0 K/uL   Monocytes Relative 14 %   Monocytes Absolute 1.0 0.1 - 1.0 K/uL   Eosinophils Relative 0 %   Eosinophils Absolute 0.0 0.0 - 0.5 K/uL   Basophils Relative 0 %   Basophils Absolute 0.0 0.0 - 0.1 K/uL   Immature Granulocytes 1 %   Abs Immature Granulocytes 0.07 0.00 - 0.07 K/uL    Comment: Performed at [facility], [address] [place], [place] [address]  Comprehensive metabolic panel     Status: Abnormal   Collection Time: 07/10/21  1:10 AM  Result Value Ref Range   Sodium 139 135 - 145 mmol/L   Potassium 3.6 3.5 - 5.1 mmol/L   Chloride 111 98 - 111 mmol/L   CO2 23 22 - 32 mmol/L    Glucose, Bld 102 (H) 70 - 99 mg/dL    Comment: Glucose reference range applies only to samples taken after fasting for at least 8 hours.   BUN 31 (H) 8 - 23 mg/dL   Creatinine, Ser 0.72 0.44 - 1.00 mg/dL   Calcium 7.1 (L) 8.9 - 10.3 mg/dL   Total Protein 4.3 (L) 6.5 - 8.1 g/dL   Albumin 1.8 (L) 3.5 - 5.0 g/dL   AST 52 (H) 15 - 41 U/L   ALT 40 0 - 44 U/L   Alkaline Phosphatase 70 38 - 126 U/L   Total Bilirubin 0.2 (L) 0.3 - 1.2 mg/dL   GFR, Estimated >60 >60 mL/min    Comment: (NOTE) Calculated using the CKD-EPI Creatinine Equation (2021)    Anion gap 5 5 - 15    Comment: Performed at [facility], [place] [address] [place], [place] [address]  Magnesium     Status: None   Collection Time: 07/10/21  1:10 AM  Result Value Ref Range   Magnesium 1.7 1.7 - 2.4 mg/dL    Comment: Performed at [facility] [address] [place], [place] [address]  Glucose, capillary  Status: Abnormal   Collection Time: 07/10/21  3:54 AM  Result Value Ref Range   Glucose-Capillary 124 (H) 70 - 99 mg/dL    Comment: Glucose reference range applies only to samples taken after fasting for at least 8 hours.  Glucose, capillary     Status: Abnormal   Collection Time: 07/10/21  8:09 AM  Result Value Ref Range   Glucose-Capillary 126 (H) 70 - 99 mg/dL    Comment: Glucose reference range applies only to samples taken after fasting for at least 8 hours.  Glucose, capillary     Status: Abnormal   Collection Time: 07/10/21 12:14 PM  Result Value Ref Range   Glucose-Capillary 144 (H) 70 - 99 mg/dL    Comment: Glucose reference range applies only to samples taken after fasting for at least 8 hours.    ECG   N/A  Telemetry   Afib with RVR - Personally Reviewed  Radiology    CT HEAD WO CONTRAST (5MM)  Result Date: 07/10/2021 CLINICAL DATA:  Initial evaluation for acute altered mental status, stroke. EXAM: CT HEAD WITHOUT CONTRAST TECHNIQUE: Contiguous axial images were obtained from the base  of the skull through the vertex without intravenous contrast. RADIATION DOSE REDUCTION: This exam was performed according to the departmental dose-optimization program which includes automated exposure control, adjustment of the mA and/or kV according to patient size and/or use of iterative reconstruction technique. COMPARISON:  Prior CT from 07/08/2021. FINDINGS: Brain: Continued interval evolution of subacute right MCA distribution infarct, relatively stable in size and distribution as compared to previous exams associated regional mass effect with trace 2 mm right-to-left shift, relatively stable. No hydrocephalus or trapping. Basilar cisterns remain patent. Associated petechial hemorrhage at the right basal ganglia again noted, slightly less conspicuous as compared to previous exam. No new hemorrhage. No other new acute large vessel territory infarct. No other acute intracranial hemorrhage. Underlying atrophy with chronic small vessel ischemic disease again noted. No extra-axial fluid collection. No visible mass lesion. Vascular: No hyperdense vessel. Calcified atherosclerosis present about the skull base. Skull: Scalp soft tissues and calvarium demonstrate no new abnormality. Sinuses/Orbits: Globes orbital soft tissues within normal limits. Paranasal sinuses and mastoid air cells remain largely clear. Other: None. IMPRESSION: 1. Continued interval evolution of large subacute right MCA distribution infarct, relatively stable in size and distribution as compared to previous. Associated petechial hemorrhage at the right basal ganglia is slightly less conspicuous as compared to prior. No new hemorrhage. Associated regional mass effect with trace 2 mm right-to-left shift, relatively stable. No hydrocephalus or trapping. 2. No other new acute intracranial abnormality. Electronically Signed   By: Jeannine Boga M.D.   On: 07/10/2021 02:22   EEG adult  Result Date: 07/10/2021 Lora Havens, MD     07/10/2021   1:05 PM [name] MRN: [id number] Epilepsy Attending: Lora Havens Referring Physician/Provider: Lorenza Chick, MD Date: 07/10/2021 Duration: 23.50 mins Patient history: 86 year old female with right MCA infarct with an episode of right gaze preference and alteration of awareness.  EEG to evaluate for seizure. Level of alertness: Awake AEDs during EEG study: None Technical aspects: This EEG study was done with scalp electrodes positioned according to the 10-20 International system of electrode placement. Electrical activity was acquired at a sampling rate of 500Hz  and reviewed with a high frequency filter of 70Hz  and a low frequency filter of 1Hz . EEG data were recorded continuously and digitally stored. Description: No clear posterior dominant rhythm was seen.  EEG showed continuous generalized 3 to 6 Hz theta Parenta slowing admixed with 15 to 18 Hz on peak activity.  Additionally there is intermittent rhythmic sharply contoured 2 to 3 Hz delta slowing in right hemisphere, maximal bifrontal region.  Hyperventilation and photic stimulation were not performed.   ABNORMALITY -Continuous slow, generalized -Frontal intermittent rhythmic delta slow, right frontal region IMPRESSION: This study is suggestive of cortical dysfunction in right hemisphere, maximal right frontal region likely secondary to underlying stroke.  Additionally there is moderate diffuse encephalopathy, nonspecific etiology. No seizures or definite epileptiform discharges were seen throughout the recording. Priyanka Barbra Sarks    Cardiac Studies   LVEF 55% by echo  Assessment   Principal Problem:   Intracerebral hemorrhage (HCC) Active Problems:   Persistent atrial fibrillation (HCC)   Pressure injury of skin   Plan   Overnight noted to have bradycardia with nauesa and diaphoresis - rhythm still appeared to be afib with slow ventricular response -suspect this was a vagal episode. Recovery was quick and  hypotension corrected with IV fluids. Metoprolol was decreased from 25 mg q8 to 12.5 mg q8.  She is still in afib with RVR at times. Reasonable to continue current dose of metoprolol and amiodarone. Anticoagulation as per neurology when cleared from cerebral hemorrhage standpoint.    No further suggestions. Could be discharged from a cardiology standpoint. Available as needed -call with questions.  Time Spent Directly with Patient:  I have spent a total of 25 minutes with the patient reviewing hospital notes, telemetry, EKGs, labs and examining the patient as well as establishing an assessment and plan that was discussed personally with the patient.  > 50% of time was spent in direct patient care.  Length of Stay:  LOS: 7 days   Pixie Casino, MD, [facility], [place] Director of the [facility]  [facility] Diplomate of the American Board of Clinical Lipidology Attending Cardiologist  Direct Dial: [phone]  Fax: [phone]  Website:  [email or web address] [email or web address] Bela Nyborg 07/10/2021, 4:09 PM

## 2021-07-10 NOTE — Significant Event (Signed)
Rapid Response Event Note   Reason for Call :  Bradycardia, decreased LOC.  Per RN, pt was at her baseline 30 min prior to RRT call. Around 0040, pt became bradycardic on monitor and when pt was assessed her LOC was decreased.   Initial Focused Assessment:  Pt lying in bed with eyes open. She has a R sided fixed gaze, L hemianopia, L facial droop, L arm/leg paralysis, L sided sensory deficit, and aphasia, NIH-32, CBG-116. These s/s are consistent with her previous stroke except she is usually able to speak and cross midline with her eyes. Pupils 5, equal, and sluggish. Skin cool/clammy/diaphoretic.  T-98.6, HR-43, BP-83/54, RR-22, SpO2-95% on RA.   Interventions:  CBG-116 500cc NS bolus CT head STAT EEG STAT CBCD/CMP/Mg Plan of Care:  Once bolus started, pt HR increased to 104 and SBP came up to 90s(these are at pt baseline). Pt then began to speak and could cross midline with her eyes. Obtain head CT, EEG, and labs. Continue to monitor pt closely. Call RRT if further assistance needed.   Event Summary:   MD Notified: Dr. Curly Shores Call Time:0046 Arrival Time:0046 End Time:  Dillard Essex, RN

## 2021-07-10 NOTE — Progress Notes (Addendum)
Patient ID: Gwendolyn [name], female   DOB: [birth year], 86 y.o.   MRN: [id number]  Notified that the patient was briefly bradycardic, with charted blood pressure 120s/79, but then dropping to 80s/50s.  Additionally she had worsening examination with not responding to commands initially, and worsened right gaze preference, not crossing midline.  While discussing with rapid response nurse, patient's mental status improved back to baseline  Given her examination is already improving, doubt this represents a structural change, but given the severity of her stroke will obtain a head CT to confirm.  Given the extensive cortical involvement, seizure is a possibility so I will also obtain EEG.  Labs to look for toxic/metabolic etiologies.  This may also represent symptomatic hypoperfusion  Plan: -Head CT -EEG -Stat labs including magnesium, CBC, CMP, POC glucose  -Agree with fluid bolus  -Reduce metoprolol from 50 every 8 hours to 12.5 every 8 hours  Lesleigh Noe MD-PhD [facility] [phone] No charge note

## 2021-07-10 NOTE — Procedures (Signed)
[name]  MRN: [id number]  Epilepsy Attending: Lora Havens  Referring Physician/Provider: Lorenza Chick, MD Date: 07/10/2021 Duration: 23.50 mins  Patient history: 86 year old female with right MCA infarct with an episode of right gaze preference and alteration of awareness.  EEG to evaluate for seizure.  Level of alertness: Awake  AEDs during EEG study: None  Technical aspects: This EEG study was done with scalp electrodes positioned according to the 10-20 International system of electrode placement. Electrical activity was acquired at a sampling rate of 500Hz  and reviewed with a high frequency filter of 70Hz  and a low frequency filter of 1Hz . EEG data were recorded continuously and digitally stored.   Description: No clear posterior dominant rhythm was seen.  EEG showed continuous generalized 3 to 6 Hz theta Parenta slowing admixed with 15 to 18 Hz on peak activity.  Additionally there is intermittent rhythmic sharply contoured 2 to 3 Hz delta slowing in right hemisphere, maximal bifrontal region.  Hyperventilation and photic stimulation were not performed.     ABNORMALITY -Continuous slow, generalized -Frontal intermittent rhythmic delta slow, right frontal region  IMPRESSION: This study is suggestive of cortical dysfunction in right hemisphere, maximal right frontal region likely secondary to underlying stroke.  Additionally there is moderate diffuse encephalopathy, nonspecific etiology. No seizures or definite epileptiform discharges were seen throughout the recording.  Tyvon Eggenberger Barbra Sarks

## 2021-07-11 ENCOUNTER — Encounter (HOSPITAL_COMMUNITY): Payer: Self-pay | Admitting: Neurology

## 2021-07-11 DIAGNOSIS — R0689 Other abnormalities of breathing: Secondary | ICD-10-CM | POA: Diagnosis not present

## 2021-07-11 DIAGNOSIS — I82612 Acute embolism and thrombosis of superficial veins of left upper extremity: Secondary | ICD-10-CM | POA: Diagnosis not present

## 2021-07-11 DIAGNOSIS — J9601 Acute respiratory failure with hypoxia: Secondary | ICD-10-CM | POA: Diagnosis not present

## 2021-07-11 DIAGNOSIS — J90 Pleural effusion, not elsewhere classified: Secondary | ICD-10-CM | POA: Diagnosis not present

## 2021-07-11 DIAGNOSIS — J9811 Atelectasis: Secondary | ICD-10-CM | POA: Diagnosis not present

## 2021-07-11 DIAGNOSIS — Z741 Need for assistance with personal care: Secondary | ICD-10-CM | POA: Diagnosis not present

## 2021-07-11 DIAGNOSIS — R54 Age-related physical debility: Secondary | ICD-10-CM | POA: Diagnosis not present

## 2021-07-11 DIAGNOSIS — I10 Essential (primary) hypertension: Secondary | ICD-10-CM | POA: Diagnosis not present

## 2021-07-11 DIAGNOSIS — L89813 Pressure ulcer of head, stage 3: Secondary | ICD-10-CM | POA: Diagnosis not present

## 2021-07-11 DIAGNOSIS — E032 Hypothyroidism due to medicaments and other exogenous substances: Secondary | ICD-10-CM | POA: Diagnosis not present

## 2021-07-11 DIAGNOSIS — Z7401 Bed confinement status: Secondary | ICD-10-CM | POA: Diagnosis not present

## 2021-07-11 DIAGNOSIS — Z7901 Long term (current) use of anticoagulants: Secondary | ICD-10-CM | POA: Diagnosis not present

## 2021-07-11 DIAGNOSIS — D638 Anemia in other chronic diseases classified elsewhere: Secondary | ICD-10-CM | POA: Diagnosis not present

## 2021-07-11 DIAGNOSIS — R262 Difficulty in walking, not elsewhere classified: Secondary | ICD-10-CM | POA: Diagnosis not present

## 2021-07-11 DIAGNOSIS — L8990 Pressure ulcer of unspecified site, unspecified stage: Secondary | ICD-10-CM | POA: Diagnosis not present

## 2021-07-11 DIAGNOSIS — Z20822 Contact with and (suspected) exposure to covid-19: Secondary | ICD-10-CM | POA: Diagnosis not present

## 2021-07-11 DIAGNOSIS — R609 Edema, unspecified: Secondary | ICD-10-CM | POA: Diagnosis not present

## 2021-07-11 DIAGNOSIS — E785 Hyperlipidemia, unspecified: Secondary | ICD-10-CM | POA: Diagnosis not present

## 2021-07-11 DIAGNOSIS — E871 Hypo-osmolality and hyponatremia: Secondary | ICD-10-CM | POA: Diagnosis not present

## 2021-07-11 DIAGNOSIS — H61001 Unspecified perichondritis of right external ear: Secondary | ICD-10-CM | POA: Diagnosis not present

## 2021-07-11 DIAGNOSIS — I619 Nontraumatic intracerebral hemorrhage, unspecified: Secondary | ICD-10-CM | POA: Diagnosis not present

## 2021-07-11 DIAGNOSIS — I3139 Other pericardial effusion (noninflammatory): Secondary | ICD-10-CM | POA: Diagnosis not present

## 2021-07-11 DIAGNOSIS — G9341 Metabolic encephalopathy: Secondary | ICD-10-CM | POA: Diagnosis not present

## 2021-07-11 DIAGNOSIS — G459 Transient cerebral ischemic attack, unspecified: Secondary | ICD-10-CM | POA: Diagnosis not present

## 2021-07-11 DIAGNOSIS — I69354 Hemiplegia and hemiparesis following cerebral infarction affecting left non-dominant side: Secondary | ICD-10-CM | POA: Diagnosis not present

## 2021-07-11 DIAGNOSIS — D649 Anemia, unspecified: Secondary | ICD-10-CM | POA: Diagnosis not present

## 2021-07-11 DIAGNOSIS — E039 Hypothyroidism, unspecified: Secondary | ICD-10-CM | POA: Diagnosis not present

## 2021-07-11 DIAGNOSIS — L89890 Pressure ulcer of other site, unstageable: Secondary | ICD-10-CM | POA: Diagnosis not present

## 2021-07-11 DIAGNOSIS — E878 Other disorders of electrolyte and fluid balance, not elsewhere classified: Secondary | ICD-10-CM | POA: Diagnosis not present

## 2021-07-11 DIAGNOSIS — I6340 Cerebral infarction due to embolism of unspecified cerebral artery: Secondary | ICD-10-CM | POA: Diagnosis not present

## 2021-07-11 DIAGNOSIS — Z66 Do not resuscitate: Secondary | ICD-10-CM | POA: Diagnosis not present

## 2021-07-11 DIAGNOSIS — I69391 Dysphagia following cerebral infarction: Secondary | ICD-10-CM | POA: Diagnosis not present

## 2021-07-11 DIAGNOSIS — R2689 Other abnormalities of gait and mobility: Secondary | ICD-10-CM | POA: Diagnosis not present

## 2021-07-11 DIAGNOSIS — R000 Tachycardia, unspecified: Secondary | ICD-10-CM | POA: Diagnosis not present

## 2021-07-11 DIAGNOSIS — R600 Localized edema: Secondary | ICD-10-CM | POA: Diagnosis not present

## 2021-07-11 DIAGNOSIS — Z931 Gastrostomy status: Secondary | ICD-10-CM | POA: Diagnosis not present

## 2021-07-11 DIAGNOSIS — L89153 Pressure ulcer of sacral region, stage 3: Secondary | ICD-10-CM | POA: Diagnosis not present

## 2021-07-11 DIAGNOSIS — R4182 Altered mental status, unspecified: Secondary | ICD-10-CM | POA: Diagnosis not present

## 2021-07-11 DIAGNOSIS — M6281 Muscle weakness (generalized): Secondary | ICD-10-CM | POA: Diagnosis not present

## 2021-07-11 DIAGNOSIS — E782 Mixed hyperlipidemia: Secondary | ICD-10-CM | POA: Diagnosis not present

## 2021-07-11 DIAGNOSIS — L89892 Pressure ulcer of other site, stage 2: Secondary | ICD-10-CM | POA: Diagnosis not present

## 2021-07-11 DIAGNOSIS — R1312 Dysphagia, oropharyngeal phase: Secondary | ICD-10-CM | POA: Diagnosis not present

## 2021-07-11 DIAGNOSIS — I5033 Acute on chronic diastolic (congestive) heart failure: Secondary | ICD-10-CM | POA: Diagnosis not present

## 2021-07-11 DIAGNOSIS — Z7189 Other specified counseling: Secondary | ICD-10-CM | POA: Diagnosis not present

## 2021-07-11 DIAGNOSIS — I4891 Unspecified atrial fibrillation: Secondary | ICD-10-CM | POA: Diagnosis not present

## 2021-07-11 DIAGNOSIS — K5900 Constipation, unspecified: Secondary | ICD-10-CM | POA: Diagnosis not present

## 2021-07-11 DIAGNOSIS — E872 Acidosis, unspecified: Secondary | ICD-10-CM | POA: Diagnosis not present

## 2021-07-11 DIAGNOSIS — R0602 Shortness of breath: Secondary | ICD-10-CM | POA: Diagnosis not present

## 2021-07-11 DIAGNOSIS — R778 Other specified abnormalities of plasma proteins: Secondary | ICD-10-CM | POA: Diagnosis not present

## 2021-07-11 DIAGNOSIS — I110 Hypertensive heart disease with heart failure: Secondary | ICD-10-CM | POA: Diagnosis not present

## 2021-07-11 DIAGNOSIS — I6389 Other cerebral infarction: Secondary | ICD-10-CM | POA: Diagnosis not present

## 2021-07-11 DIAGNOSIS — I4819 Other persistent atrial fibrillation: Secondary | ICD-10-CM | POA: Diagnosis not present

## 2021-07-11 DIAGNOSIS — I501 Left ventricular failure: Secondary | ICD-10-CM | POA: Diagnosis not present

## 2021-07-11 DIAGNOSIS — L89150 Pressure ulcer of sacral region, unstageable: Secondary | ICD-10-CM | POA: Diagnosis not present

## 2021-07-11 LAB — IRON AND TIBC
Iron: 33 ug/dL (ref 28–170)
Saturation Ratios: 14 % (ref 10.4–31.8)
TIBC: 235 ug/dL — ABNORMAL LOW (ref 250–450)
UIBC: 202 ug/dL

## 2021-07-11 LAB — CBC
HCT: 27.5 % — ABNORMAL LOW (ref 36.0–46.0)
Hemoglobin: 8.8 g/dL — ABNORMAL LOW (ref 12.0–15.0)
MCH: 32.1 pg (ref 26.0–34.0)
MCHC: 32 g/dL (ref 30.0–36.0)
MCV: 100.4 fL — ABNORMAL HIGH (ref 80.0–100.0)
Platelets: 233 10*3/uL (ref 150–400)
RBC: 2.74 MIL/uL — ABNORMAL LOW (ref 3.87–5.11)
RDW: 13.5 % (ref 11.5–15.5)
WBC: 8 10*3/uL (ref 4.0–10.5)
nRBC: 0 % (ref 0.0–0.2)

## 2021-07-11 LAB — BASIC METABOLIC PANEL
Anion gap: 7 (ref 5–15)
BUN: 33 mg/dL — ABNORMAL HIGH (ref 8–23)
CO2: 24 mmol/L (ref 22–32)
Calcium: 8 mg/dL — ABNORMAL LOW (ref 8.9–10.3)
Chloride: 106 mmol/L (ref 98–111)
Creatinine, Ser: 0.74 mg/dL (ref 0.44–1.00)
GFR, Estimated: >60 mL/min (ref >60)
Glucose, Bld: 108 mg/dL — ABNORMAL HIGH (ref 70–99)
Potassium: 4.6 mmol/L (ref 3.5–5.1)
Sodium: 137 mmol/L (ref 135–145)

## 2021-07-11 LAB — GLUCOSE, CAPILLARY
Glucose-Capillary: 125 mg/dL — ABNORMAL HIGH (ref 70–99)
Glucose-Capillary: 146 mg/dL — ABNORMAL HIGH (ref 70–99)
Glucose-Capillary: 94 mg/dL (ref 70–99)
Glucose-Capillary: 98 mg/dL (ref 70–99)
Glucose-Capillary: 144 mg/dL — ABNORMAL HIGH (ref 70–99)

## 2021-07-11 LAB — FOLATE: Folate: 6.8 ng/mL (ref >5.9)

## 2021-07-11 LAB — VITAMIN B12: Vitamin B-12: 338 pg/mL (ref 180–914)

## 2021-07-11 LAB — FERRITIN: Ferritin: 516 ng/mL — ABNORMAL HIGH (ref 11–307)

## 2021-07-11 MED ORDER — AMIODARONE HCL 200 MG PO TABS: 200.0000 mg | ORAL_TABLET | Freq: Two times a day (BID) | ORAL | Status: DC

## 2021-07-11 MED ORDER — ASPIRIN 81 MG PO CHEW: 81.0000 mg | CHEWABLE_TABLET | Freq: Every day | ORAL | Status: AC

## 2021-07-11 MED ORDER — APIXABAN 2.5 MG PO TABS: 2.5000 mg | ORAL_TABLET | Freq: Two times a day (BID) | ORAL | Status: DC

## 2021-07-11 MED ORDER — APIXABAN 2.5 MG PO TABS: 2.5000 mg | ORAL_TABLET | Freq: Two times a day (BID) | ORAL | Status: AC

## 2021-07-11 MED ORDER — ATORVASTATIN CALCIUM 40 MG PO TABS: 40.0000 mg | ORAL_TABLET | Freq: Every day | ORAL | Status: AC

## 2021-07-11 MED ORDER — METOPROLOL TARTRATE 25 MG/10 ML ORAL SUSPENSION: 12.5000 mg | Freq: Three times a day (TID) | ORAL | Status: DC

## 2021-07-11 NOTE — Discharge Summary (Addendum)
Stroke Discharge Summary  Patient ID: Gwendolyn [name]   MRN: [id number]      DOB: 1935-10-13  Date of Admission: 07/03/2021 Date of Discharge: 07/11/2021  Attending Physician:  [name] MD Consultant(s):    cardiology and general surgery  Patient's PCP:  Janie Morning, DO  DISCHARGE DIAGNOSIS:   Principal Problem:   Stroke:  right MCA large infarct with hemorrhagic transformation likely secondary due to embolism in setting of atrial fibrillation not on anticoagulation  Active Problems:   Persistent atrial fibrillation with RVR   Vasovagal reaction   HTN   HLD      Allergies as of 07/11/2021   No Known Allergies      Medication List     STOP taking these medications    amLODipine 5 MG tablet Commonly known as: NORVASC   aspirin EC 81 MG tablet Replaced by: aspirin 81 MG chewable tablet   carvedilol 6.25 MG tablet Commonly known as: COREG   lisinopril 40 MG tablet Commonly known as: ZESTRIL       TAKE these medications    amiodarone 200 MG tablet Commonly known as: PACERONE Place 1 tablet (200 mg total) into feeding tube 2 (two) times daily.   apixaban 2.5 MG Tabs tablet Commonly known as: ELIQUIS Take 1 tablet (2.5 mg total) by mouth 2 (two) times daily. Start taking on: July 16, 2021   aspirin 81 MG chewable tablet Place 1 tablet (81 mg total) into feeding tube daily. Start taking on: July 12, 2021 Replaces: aspirin EC 81 MG tablet   atorvastatin 40 MG tablet Commonly known as: LIPITOR Place 1 tablet (40 mg total) into feeding tube daily. Start taking on: July 12, 2021   metoprolol tartrate 25 mg/10 mL Susp Commonly known as: LOPRESSOR Place 5 mLs (12.5 mg total) into feeding tube every 8 (eight) hours.   Vitamin D3 1.25 MG (50000 UT) Caps Take 50,000 Units by mouth every Sunday.        LABORATORY STUDIES CBC    Component Value Date/Time   WBC 8.0 07/11/2021 0249   RBC 2.74 (L) 07/11/2021 0249   HGB 8.8 (L) 07/11/2021 0249   HGB  12.9 04/01/2017 1000   HCT 27.5 (L) 07/11/2021 0249   HCT 37.7 04/01/2017 1000   PLT 233 07/11/2021 0249   PLT 193 04/01/2017 1000   MCV 100.4 (H) 07/11/2021 0249   MCV 101 (H) 04/01/2017 1000   MCH 32.1 07/11/2021 0249   MCHC 32.0 07/11/2021 0249   RDW 13.5 07/11/2021 0249   RDW 12.2 (L) 04/01/2017 1000   LYMPHSABS 1.0 07/10/2021 0110   MONOABS 1.0 07/10/2021 0110   EOSABS 0.0 07/10/2021 0110   BASOSABS 0.0 07/10/2021 0110   CMP    Component Value Date/Time   NA 137 07/11/2021 0249   NA 140 03/18/2017 1130   K 4.6 07/11/2021 0249   CL 106 07/11/2021 0249   CO2 24 07/11/2021 0249   GLUCOSE 108 (H) 07/11/2021 0249   BUN 33 (H) 07/11/2021 0249   BUN 27 03/18/2017 1130   CREATININE 0.74 07/11/2021 0249   CALCIUM 8.0 (L) 07/11/2021 0249   PROT 4.3 (L) 07/10/2021 0110   ALBUMIN 1.8 (L) 07/10/2021 0110   AST 52 (H) 07/10/2021 0110   ALT 40 07/10/2021 0110   ALKPHOS 70 07/10/2021 0110   BILITOT 0.2 (L) 07/10/2021 0110   GFRNONAA >60 07/11/2021 0249   GFRAA >60 10/26/2019 1137   COAGS Lab Results  Component Value  Date   INR 1.2 07/03/2021   INR 1.0 10/26/2019   Lipid Panel    Component Value Date/Time   CHOL 226 (H) 07/04/2021 0040   TRIG 80 07/04/2021 0040   HDL 88 07/04/2021 0040   CHOLHDL 2.6 07/04/2021 0040   VLDL 16 07/04/2021 0040   LDLCALC 122 (H) 07/04/2021 0040   HgbA1C  Lab Results  Component Value Date   HGBA1C 5.7 (H) 07/04/2021   Urinalysis    Component Value Date/Time   COLORURINE YELLOW 07/03/2021 1626   APPEARANCEUR CLEAR 07/03/2021 1626   LABSPEC 1.029 07/03/2021 1626   PHURINE 5.0 07/03/2021 1626   GLUCOSEU NEGATIVE 07/03/2021 1626   HGBUR MODERATE (A) 07/03/2021 1626   BILIRUBINUR NEGATIVE 07/03/2021 1626   KETONESUR 80 (A) 07/03/2021 1626   PROTEINUR 100 (A) 07/03/2021 1626   UROBILINOGEN 1.0 04/03/2010 1839   NITRITE NEGATIVE 07/03/2021 1626   LEUKOCYTESUR NEGATIVE 07/03/2021 1626   Urine Drug Screen     Component Value  Date/Time   LABOPIA NONE DETECTED 07/03/2021 1626   COCAINSCRNUR NONE DETECTED 07/03/2021 1626   LABBENZ NONE DETECTED 07/03/2021 1626   AMPHETMU NONE DETECTED 07/03/2021 1626   THCU NONE DETECTED 07/03/2021 1626   LABBARB NONE DETECTED 07/03/2021 1626    Alcohol Level    Component Value Date/Time   ETH <10 10/26/2019 1135     SIGNIFICANT DIAGNOSTIC STUDIES CT ANGIO HEAD NECK W WO CM  Result Date: 07/03/2021 CLINICAL DATA:  Transient ischemic attack (TIA); left facial droop; intracranial hemorrhage on CT head EXAM: CT ANGIOGRAPHY HEAD AND NECK TECHNIQUE: Multidetector CT imaging of the head and neck was performed using the standard protocol during bolus administration of intravenous contrast. Multiplanar CT image reconstructions and MIPs were obtained to evaluate the vascular anatomy. Carotid stenosis measurements (when applicable) are obtained utilizing NASCET criteria, using the distal internal carotid diameter as the denominator. RADIATION DOSE REDUCTION: This exam was performed according to the departmental dose-optimization program which includes automated exposure control, adjustment of the mA and/or kV according to patient size and/or use of iterative reconstruction technique. CONTRAST:  13mL OMNIPAQUE IOHEXOL 350 MG/ML SOLN COMPARISON:  None Available. FINDINGS: CTA NECK Aortic arch: Mild calcified plaque. Great vessel origins are patent. Right carotid system: Patent. Mild calcified plaque at the bifurcation and proximal internal carotid. No stenosis. Irregularity of the mid to distal cervical ICA with an undulating appearance. Left carotid system: Patent. Trace calcified plaque at the bifurcation. No stenosis. Irregularity of the mid to distal cervical ICA to lesser extent than on the right. Vertebral arteries: Patent and codominant. Trace calcified plaque at the right vertebral origin. Skeleton: Cervical spine degenerative changes. Other neck: Unremarkable. Upper chest: Partially imaged  small right pleural effusion with adjacent atelectasis. Review of the MIP images confirms the above findings CTA HEAD Anterior circulation: Intracranial internal carotid arteries are patent with calcified plaque causing mild stenosis. Small inferiorly directed infundibulum distal supraclinoid of the right ICA. A 1 mm inferiorly directed outpouching on the left is also noted without definite associated vessel origin. Anterior and middle cerebral arteries are patent. There is marked stenosis of the distal right M1 MCA as it bifurcates. There is moderate to marked stenosis of the proximal anterior and posterior divisions. Diminished flow is seen within distal vessels of the right MCA territory. There is occlusion of a distal right M2 branch (series 10, image 15). Posterior circulation: Intracranial vertebral arteries are patent. Basilar artery is patent. Major cerebellar artery origins are patent. Posterior cerebral arteries  are patent. Venous sinuses: Not well evaluated on this study. Review of the MIP images confirms the above findings IMPRESSION: Marked stenosis of the distal right M1 MCA as it bifurcates with moderate to marked stenosis of the proximal anterior and posterior divisions. There is occlusion of a distal right M2 MCA branch. No abnormal vascularity in area of hemorrhage on the prior noncontrast head CT. Given the vascular findings, this may reflect hemorrhagic transformation of an ischemic infarct. No occlusion or hemodynamically significant stenosis in the neck. There is irregularity of the right greater than left cervical internal carotid arteries suggesting an underlying vasculopathy such as fibromuscular dysplasia. 1 mm inferiorly directed outpouching from the distal supraclinoid left ICA may reflect an infundibulum or aneurysm. Partially imaged small right pleural effusion with adjacent atelectasis. Electronically Signed   By: Macy Mis M.D.   On: 07/03/2021 14:46   CT HEAD WO CONTRAST  (5MM)  Result Date: 07/10/2021 CLINICAL DATA:  Initial evaluation for acute altered mental status, stroke. EXAM: CT HEAD WITHOUT CONTRAST TECHNIQUE: Contiguous axial images were obtained from the base of the skull through the vertex without intravenous contrast. RADIATION DOSE REDUCTION: This exam was performed according to the departmental dose-optimization program which includes automated exposure control, adjustment of the mA and/or kV according to patient size and/or use of iterative reconstruction technique. COMPARISON:  Prior CT from 07/08/2021. FINDINGS: Brain: Continued interval evolution of subacute right MCA distribution infarct, relatively stable in size and distribution as compared to previous exams associated regional mass effect with trace 2 mm right-to-left shift, relatively stable. No hydrocephalus or trapping. Basilar cisterns remain patent. Associated petechial hemorrhage at the right basal ganglia again noted, slightly less conspicuous as compared to previous exam. No new hemorrhage. No other new acute large vessel territory infarct. No other acute intracranial hemorrhage. Underlying atrophy with chronic small vessel ischemic disease again noted. No extra-axial fluid collection. No visible mass lesion. Vascular: No hyperdense vessel. Calcified atherosclerosis present about the skull base. Skull: Scalp soft tissues and calvarium demonstrate no new abnormality. Sinuses/Orbits: Globes orbital soft tissues within normal limits. Paranasal sinuses and mastoid air cells remain largely clear. Other: None. IMPRESSION: 1. Continued interval evolution of large subacute right MCA distribution infarct, relatively stable in size and distribution as compared to previous. Associated petechial hemorrhage at the right basal ganglia is slightly less conspicuous as compared to prior. No new hemorrhage. Associated regional mass effect with trace 2 mm right-to-left shift, relatively stable. No hydrocephalus or  trapping. 2. No other new acute intracranial abnormality. Electronically Signed   By: Jeannine Boga M.D.   On: 07/10/2021 02:22   CT HEAD WO CONTRAST (5MM)  Result Date: 07/08/2021 CLINICAL DATA:  86 year old female code stroke presentation with right MCA branch occlusion, right MCA territory infarct with some hemorrhagic transformation. EXAM: CT HEAD WITHOUT CONTRAST TECHNIQUE: Contiguous axial images were obtained from the base of the skull through the vertex without intravenous contrast. RADIATION DOSE REDUCTION: This exam was performed according to the departmental dose-optimization program which includes automated exposure control, adjustment of the mA and/or kV according to patient size and/or use of iterative reconstruction technique. COMPARISON:  Brain MRI 07/04/2021 and earlier. FINDINGS: Brain: Patchy and confluent cytotoxic edema throughout much of the right MCA territory, configuration and extent stable from DWI abnormality on 07/04/2021. Right basal ganglia petechial hemorrhage appears stable to mildly regressed since the CT on 07/03/2021. No new hemorrhagic transformation identified. But mass effect on the right lateral ventricle has progressed since 07/03/2021, and  there is now trace leftward midline shift of 2 mm. No ventriculomegaly. No IVH. Basilar cisterns remain normal. Stable gray-white matter differentiation in the left hemisphere and posterior fossa. Vascular: Calcified atherosclerosis at the skull base. Skull: No acute osseous abnormality identified. Sinuses/Orbits: Visualized paranasal sinuses and mastoids are stable and well aerated. Other: Left nasoenteric tube in place. No acute orbit or scalp soft tissue finding. IMPRESSION: 1. Relatively large Right MCA territory infarct with cytotoxic edema. Petechial hemorrhage in the right basal ganglia appears stable to slightly decreased since 07/03/2021. But intracranial mass effect has mildly progressed, including 2 mm of leftward  midline shift now. 2. No other acute intracranial abnormality. Electronically Signed   By: Genevie Ann M.D.   On: 07/08/2021 05:41   CT HEAD WO CONTRAST  Result Date: 07/03/2021 CLINICAL DATA:  Intracranial hemorrhage, follow-up EXAM: CT HEAD WITHOUT CONTRAST TECHNIQUE: Contiguous axial images were obtained from the base of the skull through the vertex without intravenous contrast. RADIATION DOSE REDUCTION: This exam was performed according to the departmental dose-optimization program which includes automated exposure control, adjustment of the mA and/or kV according to patient size and/or use of iterative reconstruction technique. COMPARISON:  07/03/2021 12:01 p.m. FINDINGS: Brain: Redemonstrated acute hemorrhages in the right basal ganglia, with a right putaminal hemorrhage that measures approximately 2.6 x 0.9 x 1.8 cm (AP x TR x CC) (series 3, image 16 and series 5, image 33), previously 2.4 x 0.8 x 1.6 cm when remeasured similarly, and a smaller hemorrhage in the right caudate that measures approximately 1.6 x 1.1 x 0.9 cm (AP x TR x CC) (series 3, image 20 and series 5, image 33), previously 1.6 x 1.0 x 0.5 cm when remeasured similarly. Overall unchanged degree of edema, without significant mass effect. No evidence of large vascular territory infarct. No hydrocephalus or extra-axial collection. Periventricular white matter changes, likely the sequela of chronic small vessel ischemic disease. Vascular: No hyperdense vessel. Skull: No acute osseous abnormality. Sinuses/Orbits: No acute finding. Status post bilateral lens replacements. Other: The mastoids are well aerated. IMPRESSION: 1. Redemonstrated acute intraparenchymal hemorrhages in the right basal ganglia, which have both slightly increased in size compared to the prior exam, with overall unchanged edema and without significant mass effect. 2.   No additional acute intracranial process. These results will be called to the ordering clinician or  representative by the Radiologist Assistant, and communication documented in the PACS or [employer]. Electronically Signed   By: Merilyn Baba M.D.   On: 07/03/2021 22:29   CT HEAD WO CONTRAST (5MM)  Result Date: 07/03/2021 CLINICAL DATA:  Neuro deficit, acute, stroke suspected EXAM: CT HEAD WITHOUT CONTRAST TECHNIQUE: Contiguous axial images were obtained from the base of the skull through the vertex without intravenous contrast. RADIATION DOSE REDUCTION: This exam was performed according to the departmental dose-optimization program which includes automated exposure control, adjustment of the mA and/or kV according to patient size and/or use of iterative reconstruction technique. COMPARISON:  CT head May 01, 2021. FINDINGS: Brain: Multiple acute hemorrhages in the right basal ganglia, including a hemorrhage in the right putamen that measures approximately 2.9 x 0.7 x 1.8 cm and smaller hemorrhage in the right caudate that measures approximately 1.4 x 0.8 x 1.1 cm. Surrounding edema without significant mass effect. No evidence of acute large vascular territory infarct. Patchy white matter hypodensities, nonspecific but compatible with chronic microvascular ischemic disease. No hydrocephalus or obvious mass lesion. Vascular: Calcific intracranial atherosclerosis. Skull: No acute fracture. Sinuses/Orbits: Clear sinuses.  No acute orbital findings. Other: No mastoid effusions. IMPRESSION: Acute intraparenchymal hemorrhages within the right basal ganglia, detailed above. Associated edema without significant mass effect. Findings discussed with Dr. Irene Pap via telephone at 12:19 PM. Electronically Signed   By: Margaretha Sheffield M.D.   On: 07/03/2021 12:22   MR BRAIN WO CONTRAST  Result Date: 07/05/2021 CLINICAL DATA:  Altered mental status, left facial droop, slurred speech, left-sided weakness EXAM: MRI HEAD WITHOUT CONTRAST TECHNIQUE: Multiplanar, multiecho pulse sequences of the brain and surrounding  structures were obtained without intravenous contrast. COMPARISON:  No prior MRI, correlation is made with CT head 07/03/2021 FINDINGS: Brain: Large area of restricted diffusion with ADC correlate in the right MCA territory(series 5, images 13-36), primarily involving the right temporal lobe, right frontal lobe, and right basal ganglia with lesser involvement of the right parietal lobe, which also demonstrates some punctate foci (series 5, images 33 and 36). These areas are associated with T2 hyperintense signal, likely cytotoxic edema, with mass effect on the right lateral ventricle but no significant midline shift. Redemonstrated hemorrhagic transformation in the right putamen, measuring up to 2.4 x 0.6 x 1.7 cm, unchanged from the prior exam when accounting for differences in technique, and the smaller hemorrhage in the right caudate, which measures up to 1.7 x 0.9 x 0.9 cm, also likely unchanged from the prior exam when accounting for differences in technique. No additional areas of hemorrhage. No mass, hydrocephalus, or extra-axial collection. The basal cisterns are patent. Normal craniocervical junction. Vascular: Normal arterial flow voids. Skull and upper cervical spine: Normal marrow signal. Sinuses/Orbits: Mild mucosal thickening in the ethmoid air cells. Status post bilateral lens replacements. Other: The mastoids are well aerated. IMPRESSION: 1. Large acute to subacute infarct in the right MCA territory, primarily involving the right temporal lobe, right frontal lobe, and right basal ganglia, with lesser involvement of the right parietal lobe. These areas are associated with edema with mild mass effect on the right lateral ventricle but no evidence of midline shift or hydrocephalus. 2. Redemonstrated hemorrhagic areas in the right putamen and caudate, which appear unchanged compared to the 07/03/2021 head CT when accounting for differences in technique. No new areas of hemorrhage. Results were  communicated on 07/05/2021 at 12:23 am to provider BHAGAT via secure text paging. Electronically Signed   By: Merilyn Baba M.D.   On: 07/05/2021 00:23   DG Pelvis Portable  Result Date: 07/03/2021 CLINICAL DATA:  Altered mental status EXAM: PORTABLE PELVIS 1-2 VIEWS COMPARISON:  02/09/2020 FINDINGS: Gas tracks along the right groin region, probably from a hernia, correlate with visual inspection. Looking back at 12/30 1/21 there were loops of bowel in the right groin region reinforcing that this is probably from hernia. External suction catheter noted. No fracture identified. Scattered vascular calcifications noted. IMPRESSION: 1. Suspected right groin hernia with gas probably in bowel projecting over the right groin region. Correlate with physical exam. 2. No acute bony findings. Electronically Signed   By: Van Clines M.D.   On: 07/03/2021 13:17   DG CHEST PORT 1 VIEW  Result Date: 07/05/2021 CLINICAL DATA:  Leukocytosis. EXAM: PORTABLE CHEST 1 VIEW COMPARISON:  Chest radiograph May 25, 23. FINDINGS: Similar mild streaky left basilar opacities. No definite pleural effusions or pneumothorax. Similar enlarged cardiac silhouette. Enteric tube courses below the diaphragm with the tip outside the field of view. Contrast within bowel in the partially visualized left upper abdomen. IMPRESSION: Similar mild streaky left basilar opacities, favor atelectasis. Aspiration and/or infection is  not excluded. Electronically Signed   By: Margaretha Sheffield M.D.   On: 07/05/2021 10:01   DG Chest Portable 1 View  Result Date: 07/03/2021 CLINICAL DATA:  Altered mental status EXAM: PORTABLE CHEST 1 VIEW COMPARISON:  None Available. FINDINGS: Normal mediastinum and cardiac silhouette. Normal pulmonary vasculature. No evidence of effusion, infiltrate, or pneumothorax. Atherosclerotic calcification of the aorta. No acute bony abnormality. IMPRESSION: No acute cardiopulmonary process. Aortic Atherosclerosis  (ICD10-I70.0). Electronically Signed   By: Suzy Bouchard M.D.   On: 07/03/2021 13:08   DG Abd Portable 1V  Result Date: 07/04/2021 CLINICAL DATA:  Feeding tube placement EXAM: PORTABLE ABDOMEN - 1 VIEW COMPARISON:  None Available. FINDINGS: Weighted tip enteric feeding tube projects over the gastric body. Oral contrast material present in the distal small bowel and colon. Moderate volume of formed stool. No obstruction. Caseous calcification noted along the mitral valve annulus. Cardiomegaly. IMPRESSION: The tip of the feeding tube overlies the mid body of the stomach. Electronically Signed   By: Jacqulynn Cadet M.D.   On: 07/04/2021 15:01   DG Swallowing Func-Speech Pathology  Result Date: 07/10/2021 Table formatting from the original result was not included. Images from the original result were not included. Objective Swallowing Evaluation: Type of Study: MBS-Modified Barium Swallow Study  Patient Details Name: Gwendolyn [name] MRN: [id number] Date of Birth: 01/13/1936 Today's Date: 07/10/2021 Time: SLP Start Time (ACUTE ONLY): 19 -SLP Stop Time (ACUTE ONLY): 1030 SLP Time Calculation (min) (ACUTE ONLY): 25 min Past Medical History: Past Medical History: Diagnosis Date  A-fib ([place])   Atrial fibrillation ([place])   Hypertension   Loss of consciousness ([place])   Syncope  Past Surgical History: Past Surgical History: Procedure Laterality Date  BARTHOLIN GLAND CYST EXCISION    ESOPHAGOGASTRODUODENOSCOPY (EGD) WITH PROPOFOL N/A 07/08/2021  Procedure: ESOPHAGOGASTRODUODENOSCOPY (EGD) WITH PROPOFOL;  Surgeon: Georganna Skeans, MD;  Location: [facility];  Service: General;  Laterality: N/A;  PEG PLACEMENT N/A 07/08/2021  Procedure: PERCUTANEOUS ENDOSCOPIC GASTROSTOMY (PEG) PLACEMENT;  Surgeon: Georganna Skeans, MD;  Location: [facility] ENDOSCOPY;  Service: General;  Laterality: N/A; HPI: Patient is an 86 y.o. female with PMH: a fib not on anticougulation, HTN, syncope. She lives in a hotel and presented to [facility] ED after being  found by hotel staff with [facility]. Per report, last known well was two days prior. She presented to ED with left side weakness, left side facial droop and slurred speech. CT head showed acute right basal ganglia hemorrhage. She failed the [place] swallow screen with nursing and has been NPO. MBS on 5/26 recommending continue NPO but trials of honey thick liquids and puree solids with SLP.  Subjective: alert, oriented, head turned far over to right side  Recommendations for follow up therapy are one component of a multi-disciplinary discharge planning process, led by the attending physician.  Recommendations may be updated based on patient status, additional functional criteria and insurance authorization. Assessment / Plan / Recommendation   07/09/2021  11:38 AM Clinical Impressions Pt demonstrates moderate oropharyngeal dysphagia with significantly decreased base of tongue retraction and upper pharyngeal contriction leading to moderate to severe residue on the base of tongue and valleculae. Pt attempts to clear with several swallows, but with nectar there are instances of silent aspiration and penetration after the initial swallow and during successive swallow attempts. Pt is able to manage puree and honey thick liquids with extra time and effort, but will need further therapeutic trials before a diet can be considered given the severity of residue and effort  involved. Pts head is turned to the right and seems to improve when head is coaxed to midline. Exam was brief due to high heart rate and Afib. Pt recommended to have Cortrak placed with SLP f/u for PO trials.  SLP Visit Diagnosis Dysphagia, oropharyngeal phase (R13.12)     07/04/2021   1:00 PM Treatment Recommendations Treatment Recommendations F/U MBS in ___ days (Comment)     07/04/2021   1:00 PM Prognosis Prognosis for Safe Diet Advancement Good   07/04/2021   1:00 PM Diet Recommendations SLP Diet Recommendations NPO;Alternative means - temporary Medication  Administration Crushed with puree     07/09/2021  11:38 AM Other Recommendations Follow Up Recommendations Skilled nursing-short term rehab (<3 hours/day) Assistance recommended at discharge Frequent or constant Supervision/Assistance   07/04/2021   1:00 PM Frequency and Duration  Speech Therapy Frequency (ACUTE ONLY) min 2x/week Treatment Duration 2 weeks     07/04/2021   1:00 PM Oral Phase Oral Phase Impaired Oral - Honey Cup Lingual/palatal residue;Delayed oral transit;Decreased bolus cohesion;Reduced posterior propulsion;Left anterior bolus loss;Weak lingual manipulation Oral - Nectar Teaspoon Lingual/palatal residue;Delayed oral transit;Decreased bolus cohesion;Reduced posterior propulsion;Left anterior bolus loss;Weak lingual manipulation Oral - Nectar Straw Lingual/palatal residue;Delayed oral transit;Decreased bolus cohesion;Reduced posterior propulsion;Left anterior bolus loss;Weak lingual manipulation Oral - Puree Lingual/palatal residue;Delayed oral transit;Decreased bolus cohesion;Reduced posterior propulsion;Left anterior bolus loss;Weak lingual manipulation    07/04/2021   1:00 PM Pharyngeal Phase Pharyngeal Phase Impaired Pharyngeal- Honey Cup Reduced epiglottic inversion;Pharyngeal residue - valleculae;Pharyngeal residue - posterior pharnyx;Nasopharyngeal reflux;Reduced tongue base retraction Pharyngeal- Nectar Teaspoon Reduced epiglottic inversion;Pharyngeal residue - valleculae;Pharyngeal residue - posterior pharnyx;Nasopharyngeal reflux;Reduced tongue base retraction Pharyngeal- Nectar Straw Reduced epiglottic inversion;Pharyngeal residue - valleculae;Pharyngeal residue - posterior pharnyx;Nasopharyngeal reflux;Reduced tongue base retraction Pharyngeal- Puree Reduced epiglottic inversion;Pharyngeal residue - valleculae;Pharyngeal residue - posterior pharnyx;Nasopharyngeal reflux;Reduced tongue base retraction     View : No data to display.    DeBlois, Katherene Ponto 07/10/2021, 1:40 PM                      EEG adult  Result Date: 07/10/2021 Lora Havens, MD     07/10/2021  1:05 PM [name] MRN: [id number] Epilepsy Attending: Lora Havens Referring Physician/Provider: Lorenza Chick, MD Date: 07/10/2021 Duration: 23.50 mins Patient history: 86 year old female with right MCA infarct with an episode of right gaze preference and alteration of awareness.  EEG to evaluate for seizure. Level of alertness: Awake AEDs during EEG study: None Technical aspects: This EEG study was done with scalp electrodes positioned according to the 10-20 International system of electrode placement. Electrical activity was acquired at a sampling rate of 500Hz  and reviewed with a high frequency filter of 70Hz  and a low frequency filter of 1Hz . EEG data were recorded continuously and digitally stored. Description: No clear posterior dominant rhythm was seen.  EEG showed continuous generalized 3 to 6 Hz theta Parenta slowing admixed with 15 to 18 Hz on peak activity.  Additionally there is intermittent rhythmic sharply contoured 2 to 3 Hz delta slowing in right hemisphere, maximal bifrontal region.  Hyperventilation and photic stimulation were not performed.   ABNORMALITY -Continuous slow, generalized -Frontal intermittent rhythmic delta slow, right frontal region IMPRESSION: This study is suggestive of cortical dysfunction in right hemisphere, maximal right frontal region likely secondary to underlying stroke.  Additionally there is moderate diffuse encephalopathy, nonspecific etiology. No seizures or definite epileptiform discharges were seen throughout the recording. [place]   ECHOCARDIOGRAM  COMPLETE  Result Date: 07/04/2021    ECHOCARDIOGRAM REPORT   Patient Name:   [facility] [place] Date of Exam: 07/04/2021 Medical Rec #:  [id number]        Height:       61.0 in Accession #:    [id number]       Weight:       117.0 lb Date of Birth:  November 13, 1935       BSA:          1.504 m Patient Age:    60 years          BP:           86/68 mmHg Patient Gender: F                HR:           109 bpm. Exam Location:  Inpatient Procedure: 2D Echo, Cardiac Doppler and Color Doppler                                MODIFIED REPORT:  This report was modified by Cherlynn Kaiser MD on 07/04/2021 due to conclusion                                     added.  Indications:     Stroke  History:         Patient has prior history of Echocardiogram examinations, most                  recent 04/01/2017. Arrythmias:Atrial Fibrillation,                  Signs/Symptoms:Syncope; Risk Factors:Hypertension.  Sonographer:     Joette Catching RCS Referring Phys:  [place] Diagnosing Phys: Cherlynn Kaiser MD IMPRESSIONS  1. Left ventricular ejection fraction, by estimation, is 55% with beat to beat variability. The left ventricle has normal function. The left ventricle has no regional wall motion abnormalities. Left ventricular diastolic parameters are indeterminate.  2. Right ventricular systolic function is mildly reduced. The right ventricular size is normal. There is normal pulmonary artery systolic pressure. The estimated right ventricular systolic pressure is [id number] mmHg.  3. Left atrial size was moderately dilated.  4. Right atrial size was moderately dilated.  5. The mitral valve is degenerative. Trivial mitral valve regurgitation. No evidence of mitral stenosis. Severe mitral annular calcification.  6. The aortic valve is grossly normal. There is mild calcification of the aortic valve. There is mild thickening of the aortic valve. Aortic valve regurgitation is not visualized. Aortic valve sclerosis is present, with no evidence of aortic valve stenosis.  7. The inferior vena cava is normal in size with <50% respiratory variability, suggesting right atrial pressure of 8 mmHg. Conclusion(s)/Recommendation(s): No intracardiac source of embolism detected on this transthoracic study. Consider a transesophageal echocardiogram to exclude  cardiac source of embolism if clinically indicated. FINDINGS  Left Ventricle: Anomalous chord inserting into basal septum. Left ventricular ejection fraction, by estimation, is 55%. The left ventricle has normal function. The left ventricle has no regional wall motion abnormalities. The left ventricular internal cavity size was normal in size. There is no left ventricular hypertrophy. Left ventricular diastolic parameters are indeterminate. Right Ventricle: The right ventricular size is normal. No increase in right ventricular wall thickness. Right ventricular systolic function is mildly reduced. There  is normal pulmonary artery systolic pressure. The tricuspid regurgitant velocity is 1.99 m/s, and with an assumed right atrial pressure of 8 mmHg, the estimated right ventricular systolic pressure is [id number] mmHg. Left Atrium: Left atrial size was moderately dilated. Right Atrium: Right atrial size was moderately dilated. Pericardium: Trivial pericardial effusion is present. Mitral Valve: The mitral valve is degenerative in appearance. Severe mitral annular calcification. Trivial mitral valve regurgitation. No evidence of mitral valve stenosis. The mean mitral valve gradient is 1.2 mmHg with average heart rate of 136 bpm. Tricuspid Valve: The tricuspid valve is normal in structure. Tricuspid valve regurgitation is mild . No evidence of tricuspid stenosis. Aortic Valve: The aortic valve is grossly normal. There is mild calcification of the aortic valve. There is mild thickening of the aortic valve. Aortic valve regurgitation is not visualized. Aortic valve sclerosis is present, with no evidence of aortic valve stenosis. Aortic valve mean gradient measures 2.0 mmHg. Aortic valve peak gradient measures 3.8 mmHg. Aortic valve area, by VTI measures 1.64 cm. Pulmonic Valve: The pulmonic valve was normal in structure. Pulmonic valve regurgitation is trivial. No evidence of pulmonic stenosis. Aorta: The aortic root is normal in  size and structure. Venous: The inferior vena cava is normal in size with less than 50% respiratory variability, suggesting right atrial pressure of 8 mmHg. IAS/Shunts: No atrial level shunt detected by color flow Doppler.  LEFT VENTRICLE PLAX 2D LVIDd:         4.30 cm     Diastology LVIDs:         2.70 cm     LV e' lateral:   7.04 cm/s LV PW:         0.90 cm     LV E/e' lateral: 15.1 LV IVS:        1.00 cm LVOT diam:     1.70 cm LV SV:         29 LV SV Index:   19 LVOT Area:     2.27 cm  LV Volumes (MOD) LV vol d, MOD A2C: 43.8 ml LV vol d, MOD A4C: 39.7 ml LV vol s, MOD A4C: 17.1 ml LV SV MOD A4C:     39.7 ml RIGHT VENTRICLE             IVC RV Basal diam:  3.30 cm     IVC diam: 1.30 cm RV Mid diam:    2.70 cm RV S prime:     10.60 cm/s TAPSE (M-mode): 1.8 cm LEFT ATRIUM             Index        RIGHT ATRIUM           Index LA diam:        3.20 cm 2.13 cm/m   RA Area:     11.00 cm LA Vol (A2C):   24.2 ml 16.09 ml/m  RA Volume:   20.00 ml  13.30 ml/m LA Vol (A4C):   28.7 ml 19.08 ml/m LA Biplane Vol: 27.2 ml 18.09 ml/m  AORTIC VALVE                    PULMONIC VALVE AV Area (Vmax):    1.78 cm     PV Vmax:          1.16 m/s AV Area (Vmean):   1.58 cm     PV Peak grad:     5.4 mmHg AV Area (VTI):     1.64  cm     PR End Diast Vel: 3.64 msec AV Vmax:           98.00 cm/s AV Vmean:          75.200 cm/s AV VTI:            0.179 m AV Peak Grad:      3.8 mmHg AV Mean Grad:      2.0 mmHg LVOT Vmax:         76.70 cm/s LVOT Vmean:        52.500 cm/s LVOT VTI:          0.129 m LVOT/AV VTI ratio: 0.72  AORTA Ao Root diam: 2.60 cm Ao Asc diam:  3.00 cm MITRAL VALVE                TRICUSPID VALVE MV Area (PHT): 4.21 cm     TR Peak grad:   15.8 mmHg MV Mean grad:  1.2 mmHg     TR Vmax:        199.00 cm/s MV Decel Time: 180 msec MV E velocity: 106.00 cm/s  SHUNTS                             Systemic VTI:  0.13 m                             Systemic Diam: 1.70 cm Cherlynn Kaiser MD Electronically signed by Cherlynn Kaiser  MD Signature Date/Time: 07/04/2021/12:26:52 PM    Final (Updated)       HISTORY OF PRESENT ILLNESS Gwendolyn [name] is a 86 y.o. female with past medical history of A. Fib not on [facility] (patient states she has not taken Eliquis for years, was stopped by MD), HTN, syncope, and lives at a hotel, presents to the [facility] ED after being found by hotel staff with [place]. Per report LSN 2 days ago, she presents with left side weakness, and left facial droop and slurred speech. She is in A fib with RVR in The ED currently on a cardizem gtt. CT Head with acute right BG hemorrhage. She denies recent headache, falls, or head trauma. She states she is having a hard time using her left side I spoke with patient's niece Theola Sequin [phone], next of kin and updated her on patient. Patients sister who is listed in chart has Dementia.   LKW: 2 days ago  tpa given?: no, ICH  ICH Score: 1 Modified Rankin Scale: 0-Completely asymptomatic and back to baseline post- stroke    HOSPITAL COURSE Ms. Gwendolyn [name] is a 86 y.o. female with history of atrial fibrillation not on anticoagulation, HTN and syncope presenting with altered mental status, left sided facial droop, slurred speech and left sided weakness.  She was found to have an acute stroke in the right basal ganglia with hemorrhagic transformation.  She was noted to be in a-fib with RVR and was initially treated with a diltiazem gtt then switched to amiodarone due to hypotension. Rate controlled with metoprolol 12.5mg  (decreased after episode of arrhythmia )  Stroke:  right MCA large infarct with hemorrhagic transformation likely secondary due to embolism in setting of atrial fibrillation not on anticoagulation CT head multiple acute hemorrhagic infarct in right basal ganglia CTA head & neck distal right M1 MCA stenosis and occlusion of distal right M2 MCA MRI large right MCA infarct with HT at right  BG and caudate CT head 5/30 Relatively large Right MCA territory  infarct with cytotoxic edema. Petechial hemorrhage in the right BG appears stable to slightly decreased since 07/03/2021. But intracranial mass effect has mildly progressed, including 2 mm of leftward midline shift now. CT 07/10/21 stable evolving right large MCA infarct and mass effect, HT less conspicuous as compared to prior 2D Echo EF 55%, moderately dilated left atrium, no atrial level shunt LDL 122 HgbA1c 5.7 VTE prophylaxis - heparin subcu aspirin 81 mg daily prior to admission, now on ASA 81.  Recommend to start eliquis 2.5mg  bid in 7 days at [facility] (07/16/21). Therapy recommendations:  SNF Disposition:  pending   Atrial fibrillation with RVR Patient is in a-fib RVR with rates up to the 160s Not on anticoagulation at home Cardiology on board, appreciate help Was initially treated with diltiazem, now on metoprolol 25 mg Q8->12.5mg  Q8 IV amiodarone switched to p.o. Now HR 100-120s   Vasovagal reaction  5/30 overnight bradycardia, hypotension with nauesa and diaphoresis  CT 07/10/21 stable evolving large infarct with HT, no significant change Recovery was quick and hypotension corrected with IV fluids.  Metoprolol was decreased from 25 mg q8 to 12.5 mg q8. Dr Debara Pickett cardiology considered vasovagal event   Hypertension Home meds:  carvedilol 6.25 mg BID, lisinopril 40 mg daily Stable on the low end Now on metoprolol  Long-term BP goal normotensive   Hyperlipidemia Home meds:  none LDL 122, goal < 70 Add atorvastatin 40 mg daily  Continue statin at discharge   Anemia, more likely from chronic disease Hb 11.5->10.1->9.9->8.3->8.8 iron level 33, ferritin 516, B12 338 and folate 6.8 Stool occult blood pending CBC monitoring   Dysphagia Did not pass swallow Speech on board, agree with PEG as it will be some time before she can swallow safely  PEG done 5/30 with trauma service Continue tube feeding   Other Stroke Risk Factors Advanced Age >/= 11    Other Active  Problems Syncope Leukocytosis WBC 14.1-> 9.9-> 8.5->10.6-> 7.8-> 7.3 resolved Elevated AST 85->52  RN Pressure Injury Documentation: Pressure Injury 07/03/21 Sacrum Medial Deep Tissue Pressure Injury - Purple or maroon localized area of discolored intact skin or blood-filled blister due to damage of underlying soft tissue from pressure and/or shear. (Active)  07/03/21 1642  Location: Sacrum  Location Orientation: Medial  Staging: Deep Tissue Pressure Injury - Purple or maroon localized area of discolored intact skin or blood-filled blister due to damage of underlying soft tissue from pressure and/or shear.  Wound Description (Comments):   Present on Admission: Yes  Dressing Type Foam - Lift dressing to assess site every shift 07/11/21 0830     Pressure Injury 07/05/21 Foot Anterior;Left Stage 2 -  Partial thickness loss of dermis presenting as a shallow open injury with a red, pink wound bed without slough. (Active)  07/05/21 1830  Location: Foot  Location Orientation: Anterior;Left  Staging: Stage 2 -  Partial thickness loss of dermis presenting as a shallow open injury with a red, pink wound bed without slough.  Wound Description (Comments):   Present on Admission: Yes  Dressing Type Gauze (Comment) 07/11/21 0830     DISCHARGE EXAM Blood pressure 128/82, pulse (!) 117, temperature 98.6 F (37 C), temperature source Oral, resp. rate 20, height 5\' 1"  (1.549 m), weight 61.4 kg, SpO2 97 %.  PHYSICAL EXAM General:  Alert, well-developed, well-nourished elderly Caucasian lady in no acute distress Respiratory:  Regular, unlabored respirations on room air   NEURO:  Mental Status: AA&Ox3 .  Gaze deviation to the right, can cross midline with effort Speech/Language: speech is without dysarthria or aphasia.  Fluency, and comprehension intact.   Cranial Nerves:  II: PERRL. Visual fields showed decreased blink to threat on the left III, IV, VI: right gaze deviation able to cross midline  with effort V: Sensation is intact to light touch and symmetrical to face with extinction to DSS on left side VII: left sided facial droop  VIII: hearing intact to voice. IX, X: Phonation is normal.  XII: tongue is midline without fasciculations. Motor: 5/5 strength to RUE and RLE, 1/5 to LUE, 1/5 to LLE Sensation- Intact to light touch on right and in LLE.  Impaired on LUE Extinction present to DSS on the left  Coordination: FTN intact on right Gait- deferred  Discharge Diet       Diet   DIET - DYS 1 Room service appropriate? No; Fluid consistency: Pudding Thick   liquids  DISCHARGE PLAN Disposition:  SNF Now on ASA 81. Please start eliquis 2.5mg  on 07/16/2021 if neuro stable.  Ongoing stroke risk factor control by Primary Care Physician at time of discharge Follow-up PCP Janie Morning, DO in 2 weeks. Follow-up in [facility] in 4 weeks, office to schedule an appointment.   40 minutes were spent preparing discharge.  Patient seen and examined by NP/APP with MD. MD to update note as needed.   Janine Ores, DNP, FNP-BC [facility] Pager: [phone]   ATTENDING NOTE: I reviewed above note and agree with the assessment and plan. Pt was seen and examined.   No acute event overnight. Neuro stable. Still has RVR, but BP improving. Hb stable now 8.8, more like anemia of chronic disease. Continue TF. Now on ASA, plan for eliquis in 5-7 days for stroke prevention. OK to d/c to SNF today, follow up at [facility] in 4 weeks.   For detailed assessment and plan, please refer to above as I have made changes wherever appropriate.   [name], MD PhD Stroke Neurology 07/11/2021 2:09 PM

## 2021-07-11 NOTE — Progress Notes (Signed)
Occupational Therapy Treatment Patient Details Name: Gwendolyn [name] MRN: [id number] DOB: [birth year] Today's Date: 07/11/2021   History of present illness 86 y.o. female presents to [facility] hospital on 07/03/2021 with AMS, L weakness, slurred speech. Pt in afib with RVR in ED. CT head with acute R BG hemorrhage. PMH includes afib, HTN, syncope, COVID 3/23   OT comments  Pt able to turn her head and eyes to midline and maintain for few seconds to search for visual targets on L. Max assist needed to cross midline to locate L UE with R. Multimodal cues to wash entire face. +2 total assist for rolling for pericare and positioning onto L side in attempt to relieve pressure on R ear. Slight flexor tone noted in L UE with yawning.    Recommendations for follow up therapy are one component of a multi-disciplinary discharge planning process, led by the attending physician.  Recommendations may be updated based on patient status, additional functional criteria and insurance authorization.    Follow Up Recommendations  Skilled nursing-short term rehab (<3 hours/day)    Assistance Recommended at Discharge Frequent or constant Supervision/Assistance  Patient can return home with the following  Two people to help with bathing/dressing/bathroom;Two people to help with walking and/or transfers;Assistance with cooking/housework;Assistance with feeding;Assist for transportation;Direct supervision/assist for financial management;Direct supervision/assist for medications management;Help with stairs or ramp for entrance   Equipment Recommendations  Other (comment) (defer to next venue)    Recommendations for Other Services      Precautions / Restrictions Precautions Precautions: Fall Precaution Comments: left neglect, left dense hemiplegia; PEG with ABD binder placed 5/30 Restrictions Weight Bearing Restrictions: No       Mobility Bed Mobility Overal bed mobility: Needs Assistance Bed Mobility:  Rolling Rolling: +2 for physical assistance, Total assist         General bed mobility comments: pt without initiation to roll for pericare and positioning on L side    Transfers                   General transfer comment: deferred, will need maximove     Balance                                           ADL either performed or assessed with clinical judgement   ADL Overall ADL's : Needs assistance/impaired     Grooming: Wash/dry hands;Bed level;Moderate assistance Grooming Details (indicate cue type and reason): multimodal cues to wash L side of face, pt with difficulty crossing midline with R hand                                    Extremity/Trunk Assessment Upper Extremity Assessment Upper Extremity Assessment: LUE deficits/detail LUE Deficits / Details: dense hemiplegia, currently with full PROM, slight flexor tone with yawning LUE Sensation: decreased proprioception;decreased light touch            Vision   Additional Comments: worked on turning eyes toward midline, able to maintain for seconds only   Perception     Praxis      Cognition Arousal/Alertness: Awake/alert Behavior During Therapy: Flat affect Overall Cognitive Status: Impaired/Different from baseline Area of Impairment: Following commands, Problem solving, Attention, Safety/judgement  Current Attention Level: Sustained   Following Commands: Follows one step commands with increased time Safety/Judgement: Decreased awareness of deficits   Problem Solving: Slow processing, Decreased initiation, Difficulty sequencing, Requires verbal cues, Requires tactile cues          Exercises Other Exercises Other Exercises: pt requiring max assist to reach across midline to locate L UE    Shoulder Instructions       General Comments      Pertinent Vitals/ Pain       Pain Assessment Pain Assessment: Faces Faces Pain Scale: Hurts  even more Pain Location: R ear Pain Descriptors / Indicators: Discomfort Pain Intervention(s): Repositioned  Home Living                                          Prior Functioning/Environment              Frequency  Min 2X/week        Progress Toward Goals  OT Goals(current goals can now be found in the care plan section)  Progress towards OT goals: Progressing toward goals  Acute Rehab OT Goals OT Goal Formulation: With patient Time For Goal Achievement: 07/19/21 Potential to Achieve Goals: Fair  Plan Discharge plan remains appropriate;Frequency remains appropriate    Co-evaluation                 AM-PAC OT "6 Clicks" Daily Activity     Outcome Measure   Help from another person eating meals?: Total Help from another person taking care of personal grooming?: A Lot   Help from another person bathing (including washing, rinsing, drying)?: Total Help from another person to put on and taking off regular upper body clothing?: Total Help from another person to put on and taking off regular lower body clothing?: Total 6 Click Score: 6    End of Session    OT Visit Diagnosis: Muscle weakness (generalized) (M62.81);Low vision, both eyes (H54.2);Other symptoms and signs involving cognitive function;Hemiplegia and hemiparesis Hemiplegia - Right/Left: Left Hemiplegia - dominant/non-dominant: Non-Dominant   Activity Tolerance Patient tolerated treatment well   Patient Left in bed;with call bell/phone within reach;with bed alarm set   Nurse Communication          Time: 0936-1000 OT Time Calculation (min): 24 min  Charges: OT General Charges $OT Visit: 1 Visit OT Treatments $Neuromuscular Re-education: 23-37 mins  Martie Round, OTR/L Acute Rehabilitation Services Pager: [phone] Office: [phone]  Evern Bio 07/11/2021, 11:33 AM

## 2021-07-11 NOTE — Progress Notes (Signed)
Speech Language Pathology Treatment: Dysphagia  Patient Details Name: Gwendolyn [name] MRN: [id number] DOB: 1935/12/25 Today's Date: 07/11/2021 Time: 8527-7824 SLP Time Calculation (min) (ACUTE ONLY): 18 min  Assessment / Plan / Recommendation Clinical Impression  Pt is able to self feed, sustaining gaze at midline, through completion of a 4 oz pudding cup. No signs of aspiration. Set up assist and one verbal reminder needed. Pt may continue pureed/pudding thick diet. Will f/u next week for potential to advance as progress is made.   HPI HPI: Patient is an 86 y.o. female with PMH: a fib not on anticougulation, HTN, syncope. She lives in a hotel and presented to [facility] ED after being found by hotel staff with AMS. Per report, last known well was two days prior. She presented to ED with left side weakness, left side facial droop and slurred speech. CT head showed acute right basal ganglia hemorrhage. She failed the Yale swallow screen with nursing and has been NPO. MBS on 5/26 recommending continue NPO but trials of honey thick liquids and puree solids with SLP.      SLP Plan  Continue with current plan of care      Recommendations for follow up therapy are one component of a multi-disciplinary discharge planning process, led by the attending physician.  Recommendations may be updated based on patient status, additional functional criteria and insurance authorization.    Recommendations  Diet recommendations: Dysphagia 1 (puree);Pudding-thick liquid Liquids provided via: Teaspoon Medication Administration: Via alternative means Supervision: Staff to assist with self feeding Compensations: Slow rate;Small sips/bites                Oral Care Recommendations: Oral care BID Follow Up Recommendations: Skilled nursing-short term rehab (<3 hours/day) Assistance recommended at discharge: Frequent or constant Supervision/Assistance SLP Visit Diagnosis: Dysphagia, oropharyngeal phase  (R13.12) Plan: Continue with current plan of care           [name], Riley Nearing  07/11/2021, 11:24 AM

## 2021-07-11 NOTE — Progress Notes (Signed)
Physical Therapy Treatment Patient Details Name: Gwendolyn [name] MRN: [id number] DOB: [birth year] Today's Date: 07/11/2021   History of Present Illness 86 y.o. female presents to [facility] hospital on 07/03/2021 with AMS, L weakness, slurred speech. Pt in afib with RVR in ED. CT head with acute R BG hemorrhage. 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 Pt resting with head turned to the rt and rt lateral flexion. Pt able to bring head toward midline but not to midline with cues. Requires assist to turn to midline. Focused on sitting balance and reaching to facilitate midline in sitting. Pt incontinent of stool resulting in end of session. Continue to recommend SNF.    Recommendations for follow up therapy are one component of a multi-disciplinary discharge planning process, led by the attending physician.  Recommendations may be updated based on patient status, additional functional criteria and insurance authorization.  Follow Up Recommendations  Skilled nursing-short term rehab (<3 hours/day)     Assistance Recommended at Discharge Frequent or constant Supervision/Assistance  Patient can return home with the following Two people to help with walking and/or transfers;Two people to help with bathing/dressing/bathroom;Assistance with cooking/housework;Assistance with feeding;Direct supervision/assist for medications management;Direct supervision/assist for financial management;Assist for transportation;Help with stairs or ramp for entrance   Equipment Recommendations  Other (comment) (Defer to next venue)    Recommendations for Other Services       Precautions / Restrictions Precautions Precautions: Fall Precaution Comments: left neglect, left dense hemiplegia; PEG with ABD binder placed 5/30 Restrictions Weight Bearing Restrictions: No     Mobility  Bed Mobility Overal bed mobility: Needs Assistance Bed  Mobility: Rolling, Supine to Sit, Sit to Supine Rolling: +2 for physical assistance, Total assist   Supine to sit: +2 for physical assistance, Total assist Sit to supine: +2 for physical assistance, Total assist   General bed mobility comments: Assist for all aspects. Pt assist with movemuent of RLE off of bed    Transfers                   General transfer comment: deferred, will need maximove    Ambulation/Gait                   Stairs             Wheelchair Mobility    Modified Rankin (Stroke Patients Only)       Balance Overall balance assessment: Needs assistance Sitting-balance support: Single extremity supported, Feet supported Sitting balance-Leahy Scale: Poor Sitting balance - Comments: min to mod assist for static sitting x 10. Worked on reaching with RUE forward and to the right to encourage trunk to midline Postural control: Left lateral lean, Posterior lean                                  Cognition Arousal/Alertness: Awake/alert Behavior During Therapy: Flat affect Overall Cognitive Status: Impaired/Different from baseline Area of Impairment: Following commands, Problem solving, Attention, Safety/judgement                   Current Attention Level: Sustained   Following Commands: Follows one step commands with increased time Safety/Judgement: Decreased awareness of deficits   Problem Solving: Slow processing, Decreased initiation, Difficulty sequencing, Requires verbal cues, Requires tactile cues          Exercises  General Comments        Pertinent Vitals/Pain Pain Assessment Pain Assessment: Faces Faces Pain Scale: Hurts even more Pain Location: R ear Pain Descriptors / Indicators: Discomfort Pain Intervention(s): Repositioned    Home Living                          Prior Function            PT Goals (current goals can now be found in the care plan section) Progress  towards PT goals: Progressing toward goals    Frequency    Min 2X/week      PT Plan Current plan remains appropriate;Frequency needs to be updated    Co-evaluation              AM-PAC PT "6 Clicks" Mobility   Outcome Measure  Help needed turning from your back to your side while in a flat bed without using bedrails?: Total Help needed moving from lying on your back to sitting on the side of a flat bed without using bedrails?: Total Help needed moving to and from a bed to a chair (including a wheelchair)?: Total Help needed standing up from a chair using your arms (e.g., wheelchair or bedside chair)?: Total Help needed to walk in hospital room?: Total Help needed climbing 3-5 steps with a railing? : Total 6 Click Score: 6    End of Session   Activity Tolerance: Patient tolerated treatment well Patient left: in bed;with call bell/phone within reach;with bed alarm set Nurse Communication: Other (comment) (Pt incontinent of stool and needed to be cleaned up) PT Visit Diagnosis: Unsteadiness on feet (R26.81);Muscle weakness (generalized) (M62.81);Difficulty in walking, not elsewhere classified (R26.2);Other symptoms and signs involving the nervous system (R29.898)     Time: [id number] PT Time Calculation (min) (ACUTE ONLY): 16 min  Charges:  $Therapeutic Activity: 8-22 mins                     [place] [facility] [place] 07/11/2021, 3:40 PM

## 2021-07-11 NOTE — TOC Transition Note (Signed)
Transition of Care [facility]) - CM/SW Discharge Note   Patient Details  Name: Gwendolyn [name] MRN: [id number] Date of Birth: [birth year]  Transition of Care [facility]) CM/SW Contact:  Mearl Latin, LCSW Phone Number: 07/11/2021, 2:18 PM   Clinical Narrative:    Patient will DC to: [facility] Anticipated DC date: 07/11/21 Family notified: Niece, Lobbyist by: Sharin Mons   Per MD patient ready for DC to [place]. RN to call report prior to discharge [phone]). RN, patient, patient's family, and facility notified of DC. Discharge Summary and FL2 sent to facility. DC packet on chart. Ambulance transport requested for patient.   CSW will sign off for now as social work intervention is no longer needed. Please consult [place] again if new needs arise.     Final next level of care: Skilled Nursing Facility Barriers to Discharge: Barriers Resolved   Patient Goals and CMS Choice Patient states their goals for this hospitalization and ongoing recovery are:: SNF CMS Medicare.gov Compare Post Acute Care list provided to:: Patient Choice offered to / list presented to : Patient  Discharge Placement   Existing PASRR number confirmed : 07/11/21          Patient chooses bed at: [facility] Patient to be transferred to facility by: [facility] Name of family member notified: Niece Patient and family notified of of transfer: 07/11/21  Discharge Plan and Services In-house Referral: Clinical Social Work                                   Social Determinants of Health (SDOH) Interventions     Readmission Risk Interventions     View : No data to display.

## 2021-07-11 NOTE — Progress Notes (Signed)
Patient awake, alert and oriented. No distress noted. IV removed from right forearm, purewick removed, PEG tube disconnected from tube feeding, flushed and clamped. Report called to [place], Therapist, sports at [place] at 2130. Patient discharged via stretcher by [facility].

## 2021-07-11 NOTE — TOC Transition Note (Signed)
Transition of Care [facility]) - CM/SW Discharge Note   Patient Details  Name: Gwendolyn [name] MRN: [id number] Date of Birth: 12-21-1935  Transition of Care [facility]) CM/SW Contact:  Benard Halsted, LCSW Phone Number: 07/11/2021, 2:15 PM   Clinical Narrative:    CSW met with patient. She reported preference for [facility]. [facility] able to accept patient today.    Final next level of care: Skilled Nursing Facility Barriers to Discharge: Barriers Resolved   Patient Goals and CMS Choice Patient states their goals for this hospitalization and ongoing recovery are:: SNF CMS Medicare.gov Compare Post Acute Care list provided to:: Patient Choice offered to / list presented to : Patient  Discharge Placement   Existing PASRR number confirmed : 07/11/21          Patient chooses bed at: [facility] Patient to be transferred to facility by: [place] Name of family member notified: Niece Patient and family notified of of transfer: 07/11/21  Discharge Plan and Services In-house Referral: Clinical Social Work                                   Social Determinants of Health (SDOH) Interventions     Readmission Risk Interventions     View : No data to display.

## 2021-07-14 DIAGNOSIS — L89150 Pressure ulcer of sacral region, unstageable: Secondary | ICD-10-CM | POA: Diagnosis not present

## 2021-07-14 DIAGNOSIS — L89890 Pressure ulcer of other site, unstageable: Secondary | ICD-10-CM | POA: Diagnosis not present

## 2021-07-15 DIAGNOSIS — I4819 Other persistent atrial fibrillation: Secondary | ICD-10-CM | POA: Diagnosis not present

## 2021-07-15 DIAGNOSIS — E785 Hyperlipidemia, unspecified: Secondary | ICD-10-CM | POA: Diagnosis not present

## 2021-07-15 DIAGNOSIS — I619 Nontraumatic intracerebral hemorrhage, unspecified: Secondary | ICD-10-CM | POA: Diagnosis not present

## 2021-07-15 DIAGNOSIS — I6340 Cerebral infarction due to embolism of unspecified cerebral artery: Secondary | ICD-10-CM | POA: Diagnosis not present

## 2021-07-21 DIAGNOSIS — L89892 Pressure ulcer of other site, stage 2: Secondary | ICD-10-CM | POA: Diagnosis not present

## 2021-07-21 DIAGNOSIS — L89150 Pressure ulcer of sacral region, unstageable: Secondary | ICD-10-CM | POA: Diagnosis not present

## 2021-07-21 DIAGNOSIS — L8990 Pressure ulcer of unspecified site, unspecified stage: Secondary | ICD-10-CM | POA: Diagnosis not present

## 2021-07-21 DIAGNOSIS — L89890 Pressure ulcer of other site, unstageable: Secondary | ICD-10-CM | POA: Diagnosis not present

## 2021-07-23 DIAGNOSIS — R2689 Other abnormalities of gait and mobility: Secondary | ICD-10-CM | POA: Diagnosis not present

## 2021-07-23 DIAGNOSIS — I69354 Hemiplegia and hemiparesis following cerebral infarction affecting left non-dominant side: Secondary | ICD-10-CM | POA: Diagnosis not present

## 2021-07-23 DIAGNOSIS — I4819 Other persistent atrial fibrillation: Secondary | ICD-10-CM | POA: Diagnosis not present

## 2021-07-23 DIAGNOSIS — I6340 Cerebral infarction due to embolism of unspecified cerebral artery: Secondary | ICD-10-CM | POA: Diagnosis not present

## 2021-07-23 DIAGNOSIS — D649 Anemia, unspecified: Secondary | ICD-10-CM | POA: Diagnosis not present

## 2021-07-23 DIAGNOSIS — R1312 Dysphagia, oropharyngeal phase: Secondary | ICD-10-CM | POA: Diagnosis not present

## 2021-07-23 DIAGNOSIS — M6281 Muscle weakness (generalized): Secondary | ICD-10-CM | POA: Diagnosis not present

## 2021-07-28 DIAGNOSIS — L89150 Pressure ulcer of sacral region, unstageable: Secondary | ICD-10-CM | POA: Diagnosis not present

## 2021-07-28 DIAGNOSIS — L89890 Pressure ulcer of other site, unstageable: Secondary | ICD-10-CM | POA: Diagnosis not present

## 2021-07-28 DIAGNOSIS — L8990 Pressure ulcer of unspecified site, unspecified stage: Secondary | ICD-10-CM | POA: Diagnosis not present

## 2021-07-28 DIAGNOSIS — L89892 Pressure ulcer of other site, stage 2: Secondary | ICD-10-CM | POA: Diagnosis not present

## 2021-07-30 DIAGNOSIS — R1312 Dysphagia, oropharyngeal phase: Secondary | ICD-10-CM | POA: Diagnosis not present

## 2021-07-30 DIAGNOSIS — I619 Nontraumatic intracerebral hemorrhage, unspecified: Secondary | ICD-10-CM | POA: Diagnosis not present

## 2021-07-30 DIAGNOSIS — D649 Anemia, unspecified: Secondary | ICD-10-CM | POA: Diagnosis not present

## 2021-07-30 DIAGNOSIS — R262 Difficulty in walking, not elsewhere classified: Secondary | ICD-10-CM | POA: Diagnosis not present

## 2021-07-30 DIAGNOSIS — I69354 Hemiplegia and hemiparesis following cerebral infarction affecting left non-dominant side: Secondary | ICD-10-CM | POA: Diagnosis not present

## 2021-07-30 DIAGNOSIS — M6281 Muscle weakness (generalized): Secondary | ICD-10-CM | POA: Diagnosis not present

## 2021-07-30 DIAGNOSIS — I4819 Other persistent atrial fibrillation: Secondary | ICD-10-CM | POA: Diagnosis not present

## 2021-08-04 ENCOUNTER — Other Ambulatory Visit: Payer: Self-pay | Admitting: *Deleted

## 2021-08-06 DIAGNOSIS — M6281 Muscle weakness (generalized): Secondary | ICD-10-CM | POA: Diagnosis not present

## 2021-08-06 DIAGNOSIS — I4819 Other persistent atrial fibrillation: Secondary | ICD-10-CM | POA: Diagnosis not present

## 2021-08-06 DIAGNOSIS — I619 Nontraumatic intracerebral hemorrhage, unspecified: Secondary | ICD-10-CM | POA: Diagnosis not present

## 2021-08-06 DIAGNOSIS — I69354 Hemiplegia and hemiparesis following cerebral infarction affecting left non-dominant side: Secondary | ICD-10-CM | POA: Diagnosis not present

## 2021-08-06 DIAGNOSIS — D649 Anemia, unspecified: Secondary | ICD-10-CM | POA: Diagnosis not present

## 2021-08-06 DIAGNOSIS — R262 Difficulty in walking, not elsewhere classified: Secondary | ICD-10-CM | POA: Diagnosis not present

## 2021-08-06 DIAGNOSIS — R1312 Dysphagia, oropharyngeal phase: Secondary | ICD-10-CM | POA: Diagnosis not present

## 2021-08-11 DIAGNOSIS — L89150 Pressure ulcer of sacral region, unstageable: Secondary | ICD-10-CM | POA: Diagnosis not present

## 2021-08-11 DIAGNOSIS — L89890 Pressure ulcer of other site, unstageable: Secondary | ICD-10-CM | POA: Diagnosis not present

## 2021-08-11 DIAGNOSIS — L89892 Pressure ulcer of other site, stage 2: Secondary | ICD-10-CM | POA: Diagnosis not present

## 2021-08-13 DIAGNOSIS — M6281 Muscle weakness (generalized): Secondary | ICD-10-CM | POA: Diagnosis not present

## 2021-08-13 DIAGNOSIS — I4819 Other persistent atrial fibrillation: Secondary | ICD-10-CM | POA: Diagnosis not present

## 2021-08-13 DIAGNOSIS — R262 Difficulty in walking, not elsewhere classified: Secondary | ICD-10-CM | POA: Diagnosis not present

## 2021-08-13 DIAGNOSIS — I69354 Hemiplegia and hemiparesis following cerebral infarction affecting left non-dominant side: Secondary | ICD-10-CM | POA: Diagnosis not present

## 2021-08-13 DIAGNOSIS — R1312 Dysphagia, oropharyngeal phase: Secondary | ICD-10-CM | POA: Diagnosis not present

## 2021-08-13 DIAGNOSIS — I619 Nontraumatic intracerebral hemorrhage, unspecified: Secondary | ICD-10-CM | POA: Diagnosis not present

## 2021-08-13 DIAGNOSIS — D649 Anemia, unspecified: Secondary | ICD-10-CM | POA: Diagnosis not present

## 2021-08-18 DIAGNOSIS — L89890 Pressure ulcer of other site, unstageable: Secondary | ICD-10-CM | POA: Diagnosis not present

## 2021-08-18 DIAGNOSIS — L8990 Pressure ulcer of unspecified site, unspecified stage: Secondary | ICD-10-CM | POA: Diagnosis not present

## 2021-08-18 DIAGNOSIS — L89892 Pressure ulcer of other site, stage 2: Secondary | ICD-10-CM | POA: Diagnosis not present

## 2021-08-18 DIAGNOSIS — I619 Nontraumatic intracerebral hemorrhage, unspecified: Secondary | ICD-10-CM | POA: Diagnosis not present

## 2021-08-18 DIAGNOSIS — E785 Hyperlipidemia, unspecified: Secondary | ICD-10-CM | POA: Diagnosis not present

## 2021-08-18 DIAGNOSIS — I6340 Cerebral infarction due to embolism of unspecified cerebral artery: Secondary | ICD-10-CM | POA: Diagnosis not present

## 2021-08-18 DIAGNOSIS — L89150 Pressure ulcer of sacral region, unstageable: Secondary | ICD-10-CM | POA: Diagnosis not present

## 2021-08-20 DIAGNOSIS — I619 Nontraumatic intracerebral hemorrhage, unspecified: Secondary | ICD-10-CM | POA: Diagnosis not present

## 2021-08-20 DIAGNOSIS — I4819 Other persistent atrial fibrillation: Secondary | ICD-10-CM | POA: Diagnosis not present

## 2021-08-20 DIAGNOSIS — I69354 Hemiplegia and hemiparesis following cerebral infarction affecting left non-dominant side: Secondary | ICD-10-CM | POA: Diagnosis not present

## 2021-08-20 DIAGNOSIS — M6281 Muscle weakness (generalized): Secondary | ICD-10-CM | POA: Diagnosis not present

## 2021-08-20 DIAGNOSIS — R1312 Dysphagia, oropharyngeal phase: Secondary | ICD-10-CM | POA: Diagnosis not present

## 2021-08-20 DIAGNOSIS — R262 Difficulty in walking, not elsewhere classified: Secondary | ICD-10-CM | POA: Diagnosis not present

## 2021-08-20 DIAGNOSIS — D649 Anemia, unspecified: Secondary | ICD-10-CM | POA: Diagnosis not present

## 2021-08-25 DIAGNOSIS — L89153 Pressure ulcer of sacral region, stage 3: Secondary | ICD-10-CM | POA: Diagnosis not present

## 2021-08-25 DIAGNOSIS — L89892 Pressure ulcer of other site, stage 2: Secondary | ICD-10-CM | POA: Diagnosis not present

## 2021-08-26 ENCOUNTER — Other Ambulatory Visit: Payer: Self-pay

## 2021-08-26 ENCOUNTER — Inpatient Hospital Stay (HOSPITAL_COMMUNITY)
Admission: EM | Admit: 2021-08-26 | Discharge: 2021-09-09 | DRG: 308 | Disposition: A | Discharge status: Skilled Nursing Facility | Payer: Medicare Other | Source: Skilled Nursing Facility | Attending: Internal Medicine | Admitting: Internal Medicine

## 2021-08-26 ENCOUNTER — Emergency Department (HOSPITAL_COMMUNITY): Payer: Medicare Other

## 2021-08-26 ENCOUNTER — Encounter (HOSPITAL_COMMUNITY): Payer: Self-pay

## 2021-08-26 DIAGNOSIS — R58 Hemorrhage, not elsewhere classified: Secondary | ICD-10-CM | POA: Diagnosis present

## 2021-08-26 DIAGNOSIS — I5033 Acute on chronic diastolic (congestive) heart failure: Secondary | ICD-10-CM | POA: Diagnosis not present

## 2021-08-26 DIAGNOSIS — I501 Left ventricular failure: Secondary | ICD-10-CM | POA: Diagnosis not present

## 2021-08-26 DIAGNOSIS — H61001 Unspecified perichondritis of right external ear: Secondary | ICD-10-CM | POA: Diagnosis not present

## 2021-08-26 DIAGNOSIS — D649 Anemia, unspecified: Secondary | ICD-10-CM | POA: Diagnosis not present

## 2021-08-26 DIAGNOSIS — E032 Hypothyroidism due to medicaments and other exogenous substances: Secondary | ICD-10-CM | POA: Diagnosis present

## 2021-08-26 DIAGNOSIS — R54 Age-related physical debility: Secondary | ICD-10-CM | POA: Diagnosis present

## 2021-08-26 DIAGNOSIS — R4182 Altered mental status, unspecified: Secondary | ICD-10-CM | POA: Diagnosis not present

## 2021-08-26 DIAGNOSIS — R778 Other specified abnormalities of plasma proteins: Secondary | ICD-10-CM | POA: Diagnosis not present

## 2021-08-26 DIAGNOSIS — R0602 Shortness of breath: Principal | ICD-10-CM

## 2021-08-26 DIAGNOSIS — E039 Hypothyroidism, unspecified: Secondary | ICD-10-CM | POA: Diagnosis not present

## 2021-08-26 DIAGNOSIS — E871 Hypo-osmolality and hyponatremia: Secondary | ICD-10-CM | POA: Diagnosis present

## 2021-08-26 DIAGNOSIS — Z7901 Long term (current) use of anticoagulants: Secondary | ICD-10-CM

## 2021-08-26 DIAGNOSIS — M2550 Pain in unspecified joint: Secondary | ICD-10-CM | POA: Diagnosis not present

## 2021-08-26 DIAGNOSIS — I69328 Other speech and language deficits following cerebral infarction: Secondary | ICD-10-CM | POA: Diagnosis not present

## 2021-08-26 DIAGNOSIS — Z801 Family history of malignant neoplasm of trachea, bronchus and lung: Secondary | ICD-10-CM

## 2021-08-26 DIAGNOSIS — Z833 Family history of diabetes mellitus: Secondary | ICD-10-CM

## 2021-08-26 DIAGNOSIS — E878 Other disorders of electrolyte and fluid balance, not elsewhere classified: Secondary | ICD-10-CM | POA: Diagnosis not present

## 2021-08-26 DIAGNOSIS — K5900 Constipation, unspecified: Secondary | ICD-10-CM | POA: Diagnosis not present

## 2021-08-26 DIAGNOSIS — R600 Localized edema: Secondary | ICD-10-CM | POA: Diagnosis not present

## 2021-08-26 DIAGNOSIS — I3139 Other pericardial effusion (noninflammatory): Secondary | ICD-10-CM | POA: Diagnosis present

## 2021-08-26 DIAGNOSIS — Z7982 Long term (current) use of aspirin: Secondary | ICD-10-CM

## 2021-08-26 DIAGNOSIS — R000 Tachycardia, unspecified: Secondary | ICD-10-CM | POA: Diagnosis not present

## 2021-08-26 DIAGNOSIS — M6281 Muscle weakness (generalized): Secondary | ICD-10-CM | POA: Diagnosis not present

## 2021-08-26 DIAGNOSIS — Z8261 Family history of arthritis: Secondary | ICD-10-CM

## 2021-08-26 DIAGNOSIS — Z7401 Bed confinement status: Secondary | ICD-10-CM | POA: Diagnosis not present

## 2021-08-26 DIAGNOSIS — I4819 Other persistent atrial fibrillation: Secondary | ICD-10-CM | POA: Diagnosis not present

## 2021-08-26 DIAGNOSIS — I69354 Hemiplegia and hemiparesis following cerebral infarction affecting left non-dominant side: Secondary | ICD-10-CM

## 2021-08-26 DIAGNOSIS — E782 Mixed hyperlipidemia: Secondary | ICD-10-CM | POA: Diagnosis present

## 2021-08-26 DIAGNOSIS — E872 Acidosis, unspecified: Secondary | ICD-10-CM | POA: Diagnosis present

## 2021-08-26 DIAGNOSIS — I4891 Unspecified atrial fibrillation: Secondary | ICD-10-CM | POA: Diagnosis present

## 2021-08-26 DIAGNOSIS — Z9181 History of falling: Secondary | ICD-10-CM

## 2021-08-26 DIAGNOSIS — I69391 Dysphagia following cerebral infarction: Secondary | ICD-10-CM

## 2021-08-26 DIAGNOSIS — R41841 Cognitive communication deficit: Secondary | ICD-10-CM | POA: Diagnosis not present

## 2021-08-26 DIAGNOSIS — I6340 Cerebral infarction due to embolism of unspecified cerebral artery: Secondary | ICD-10-CM | POA: Diagnosis not present

## 2021-08-26 DIAGNOSIS — G9341 Metabolic encephalopathy: Secondary | ICD-10-CM | POA: Diagnosis present

## 2021-08-26 DIAGNOSIS — T462X5A Adverse effect of other antidysrhythmic drugs, initial encounter: Secondary | ICD-10-CM | POA: Diagnosis present

## 2021-08-26 DIAGNOSIS — Z79899 Other long term (current) drug therapy: Secondary | ICD-10-CM

## 2021-08-26 DIAGNOSIS — R1310 Dysphagia, unspecified: Secondary | ICD-10-CM | POA: Diagnosis present

## 2021-08-26 DIAGNOSIS — Z66 Do not resuscitate: Secondary | ICD-10-CM | POA: Diagnosis not present

## 2021-08-26 DIAGNOSIS — Z931 Gastrostomy status: Secondary | ICD-10-CM

## 2021-08-26 DIAGNOSIS — I619 Nontraumatic intracerebral hemorrhage, unspecified: Secondary | ICD-10-CM | POA: Diagnosis not present

## 2021-08-26 DIAGNOSIS — I82612 Acute embolism and thrombosis of superficial veins of left upper extremity: Secondary | ICD-10-CM | POA: Diagnosis present

## 2021-08-26 DIAGNOSIS — I10 Essential (primary) hypertension: Secondary | ICD-10-CM | POA: Diagnosis not present

## 2021-08-26 DIAGNOSIS — L89813 Pressure ulcer of head, stage 3: Secondary | ICD-10-CM | POA: Diagnosis not present

## 2021-08-26 DIAGNOSIS — Z7189 Other specified counseling: Secondary | ICD-10-CM | POA: Diagnosis not present

## 2021-08-26 DIAGNOSIS — J9811 Atelectasis: Secondary | ICD-10-CM | POA: Diagnosis not present

## 2021-08-26 DIAGNOSIS — R293 Abnormal posture: Secondary | ICD-10-CM | POA: Diagnosis not present

## 2021-08-26 DIAGNOSIS — J9601 Acute respiratory failure with hypoxia: Secondary | ICD-10-CM | POA: Diagnosis not present

## 2021-08-26 DIAGNOSIS — Z20822 Contact with and (suspected) exposure to covid-19: Secondary | ICD-10-CM | POA: Diagnosis present

## 2021-08-26 DIAGNOSIS — K4090 Unilateral inguinal hernia, without obstruction or gangrene, not specified as recurrent: Secondary | ICD-10-CM | POA: Diagnosis present

## 2021-08-26 DIAGNOSIS — I110 Hypertensive heart disease with heart failure: Secondary | ICD-10-CM | POA: Diagnosis not present

## 2021-08-26 DIAGNOSIS — Z741 Need for assistance with personal care: Secondary | ICD-10-CM | POA: Diagnosis not present

## 2021-08-26 DIAGNOSIS — R0689 Other abnormalities of breathing: Secondary | ICD-10-CM | POA: Diagnosis not present

## 2021-08-26 DIAGNOSIS — Z820 Family history of epilepsy and other diseases of the nervous system: Secondary | ICD-10-CM

## 2021-08-26 DIAGNOSIS — J90 Pleural effusion, not elsewhere classified: Secondary | ICD-10-CM | POA: Diagnosis not present

## 2021-08-26 DIAGNOSIS — M6259 Muscle wasting and atrophy, not elsewhere classified, multiple sites: Secondary | ICD-10-CM | POA: Diagnosis not present

## 2021-08-26 DIAGNOSIS — I82409 Acute embolism and thrombosis of unspecified deep veins of unspecified lower extremity: Secondary | ICD-10-CM | POA: Diagnosis not present

## 2021-08-26 DIAGNOSIS — R609 Edema, unspecified: Secondary | ICD-10-CM | POA: Diagnosis not present

## 2021-08-26 LAB — I-STAT CHEM 8, ED
BUN: 33 mg/dL — ABNORMAL HIGH (ref 8–23)
Calcium, Ion: 1 mmol/L — ABNORMAL LOW (ref 1.15–1.40)
Chloride: 86 mmol/L — ABNORMAL LOW (ref 98–111)
Creatinine, Ser: 0.7 mg/dL (ref 0.44–1.00)
Glucose, Bld: 128 mg/dL — ABNORMAL HIGH (ref 70–99)
HCT: 24 % — ABNORMAL LOW (ref 36.0–46.0)
Hemoglobin: 8.2 g/dL — ABNORMAL LOW (ref 12.0–15.0)
Potassium: 4.7 mmol/L (ref 3.5–5.1)
Sodium: 116 mmol/L — CL (ref 135–145)
TCO2: 24 mmol/L (ref 22–32)

## 2021-08-26 LAB — I-STAT VENOUS BLOOD GAS, ED
Acid-Base Excess: 0 mmol/L (ref 0.0–2.0)
Acid-Base Excess: 2 mmol/L (ref 0.0–2.0)
Bicarbonate: 24.5 mmol/L (ref 20.0–28.0)
Bicarbonate: 25.2 mmol/L (ref 20.0–28.0)
Calcium, Ion: 1.02 mmol/L — ABNORMAL LOW (ref 1.15–1.40)
Calcium, Ion: 1.02 mmol/L — ABNORMAL LOW (ref 1.15–1.40)
HCT: 25 % — ABNORMAL LOW (ref 36.0–46.0)
HCT: 19 % — ABNORMAL LOW (ref 36.0–46.0)
Hemoglobin: 8.5 g/dL — ABNORMAL LOW (ref 12.0–15.0)
Hemoglobin: 6.5 g/dL — CL (ref 12.0–15.0)
O2 Saturation: 100 %
O2 Saturation: 99 %
Potassium: 4.7 mmol/L (ref 3.5–5.1)
Potassium: 4.6 mmol/L (ref 3.5–5.1)
Sodium: 116 mmol/L — CL (ref 135–145)
Sodium: 118 mmol/L — CL (ref 135–145)
TCO2: 26 mmol/L (ref 22–32)
TCO2: 26 mmol/L (ref 22–32)
pCO2, Ven: 38.2 mmHg — ABNORMAL LOW (ref 44–60)
pCO2, Ven: 29.8 mmHg — ABNORMAL LOW (ref 44–60)
pH, Ven: 7.415 (ref 7.25–7.43)
pH, Ven: 7.535 — ABNORMAL HIGH (ref 7.25–7.43)
pO2, Ven: 177 mmHg — ABNORMAL HIGH (ref 32–45)
pO2, Ven: 111 mmHg — ABNORMAL HIGH (ref 32–45)

## 2021-08-26 LAB — LACTIC ACID, PLASMA
Lactic Acid, Venous: 2.1 mmol/L (ref 0.5–1.9)
Lactic Acid, Venous: 1.8 mmol/L (ref 0.5–1.9)

## 2021-08-26 LAB — COMPREHENSIVE METABOLIC PANEL
ALT: 134 U/L — ABNORMAL HIGH (ref 0–44)
AST: 86 U/L — ABNORMAL HIGH (ref 15–41)
Albumin: 2.7 g/dL — ABNORMAL LOW (ref 3.5–5.0)
Alkaline Phosphatase: 220 U/L — ABNORMAL HIGH (ref 38–126)
Anion gap: 14 (ref 5–15)
BUN: 37 mg/dL — ABNORMAL HIGH (ref 8–23)
CO2: 21 mmol/L — ABNORMAL LOW (ref 22–32)
Calcium: 8.3 mg/dL — ABNORMAL LOW (ref 8.9–10.3)
Chloride: 84 mmol/L — ABNORMAL LOW (ref 98–111)
Creatinine, Ser: 0.81 mg/dL (ref 0.44–1.00)
GFR, Estimated: >60 mL/min (ref >60)
Glucose, Bld: 119 mg/dL — ABNORMAL HIGH (ref 70–99)
Potassium: 4.8 mmol/L (ref 3.5–5.1)
Sodium: 119 mmol/L — CL (ref 135–145)
Total Bilirubin: 0.7 mg/dL (ref 0.3–1.2)
Total Protein: 7 g/dL (ref 6.5–8.1)

## 2021-08-26 LAB — CBC WITH DIFFERENTIAL/PLATELET
Abs Immature Granulocytes: 0.26 10*3/uL — ABNORMAL HIGH (ref 0.00–0.07)
Basophils Absolute: 0 10*3/uL (ref 0.0–0.1)
Basophils Relative: 0 %
Eosinophils Absolute: 0.1 10*3/uL (ref 0.0–0.5)
Eosinophils Relative: 1 %
HCT: 24 % — ABNORMAL LOW (ref 36.0–46.0)
Hemoglobin: 7.7 g/dL — ABNORMAL LOW (ref 12.0–15.0)
Immature Granulocytes: 2 %
Lymphocytes Relative: 6 %
Lymphs Abs: 0.8 10*3/uL (ref 0.7–4.0)
MCH: 30.4 pg (ref 26.0–34.0)
MCHC: 32.1 g/dL (ref 30.0–36.0)
MCV: 94.9 fL (ref 80.0–100.0)
Monocytes Absolute: 1.4 10*3/uL — ABNORMAL HIGH (ref 0.1–1.0)
Monocytes Relative: 10 %
Neutro Abs: 11 10*3/uL — ABNORMAL HIGH (ref 1.7–7.7)
Neutrophils Relative %: 81 %
Platelets: 272 10*3/uL (ref 150–400)
RBC: 2.53 MIL/uL — ABNORMAL LOW (ref 3.87–5.11)
RDW: 15.9 % — ABNORMAL HIGH (ref 11.5–15.5)
WBC: 13.5 10*3/uL — ABNORMAL HIGH (ref 4.0–10.5)
nRBC: 0 % (ref 0.0–0.2)

## 2021-08-26 LAB — BRAIN NATRIURETIC PEPTIDE: B Natriuretic Peptide: 437.4 pg/mL — ABNORMAL HIGH (ref 0.0–100.0)

## 2021-08-26 LAB — BASIC METABOLIC PANEL
Anion gap: 12 (ref 5–15)
BUN: 35 mg/dL — ABNORMAL HIGH (ref 8–23)
CO2: 23 mmol/L (ref 22–32)
Calcium: 8 mg/dL — ABNORMAL LOW (ref 8.9–10.3)
Chloride: 85 mmol/L — ABNORMAL LOW (ref 98–111)
Creatinine, Ser: 0.94 mg/dL (ref 0.44–1.00)
GFR, Estimated: >60 mL/min (ref >60)
Glucose, Bld: 111 mg/dL — ABNORMAL HIGH (ref 70–99)
Potassium: 4.8 mmol/L (ref 3.5–5.1)
Sodium: 120 mmol/L — ABNORMAL LOW (ref 135–145)

## 2021-08-26 LAB — CBC
HCT: 19.6 % — ABNORMAL LOW (ref 36.0–46.0)
Hemoglobin: 6.6 g/dL — CL (ref 12.0–15.0)
MCH: 31 pg (ref 26.0–34.0)
MCHC: 33.7 g/dL (ref 30.0–36.0)
MCV: 92 fL (ref 80.0–100.0)
Platelets: 237 10*3/uL (ref 150–400)
RBC: 2.13 MIL/uL — ABNORMAL LOW (ref 3.87–5.11)
RDW: 16 % — ABNORMAL HIGH (ref 11.5–15.5)
WBC: 9.7 10*3/uL (ref 4.0–10.5)
nRBC: 0 % (ref 0.0–0.2)

## 2021-08-26 LAB — PREPARE RBC (CROSSMATCH)

## 2021-08-26 LAB — TROPONIN I (HIGH SENSITIVITY)
Troponin I (High Sensitivity): 24 ng/L — ABNORMAL HIGH (ref <18)
Troponin I (High Sensitivity): 22 ng/L — ABNORMAL HIGH (ref <18)

## 2021-08-26 LAB — SODIUM, URINE, RANDOM: Sodium, Ur: 23 mmol/L

## 2021-08-26 LAB — OSMOLALITY, URINE: Osmolality, Ur: 279 mOsm/kg — ABNORMAL LOW (ref 300–900)

## 2021-08-26 MED ORDER — SODIUM CHLORIDE 0.9% IV SOLUTION: Freq: Once | INTRAVENOUS | Status: AC

## 2021-08-26 MED ORDER — ALBUTEROL SULFATE (2.5 MG/3ML) 0.083% IN NEBU
10.0000 mg/h | INHALATION_SOLUTION | Freq: Once | RESPIRATORY_TRACT | Status: AC
  Administered 2021-08-26: 10 mg/h via RESPIRATORY_TRACT
  Filled 2021-08-26: qty 3

## 2021-08-26 MED ORDER — FUROSEMIDE 10 MG/ML IJ SOLN
40.0000 mg | Freq: Two times a day (BID) | INTRAMUSCULAR | Status: DC
  Administered 2021-08-26 – 2021-09-01 (×12): 40 mg via INTRAVENOUS
  Filled 2021-08-26 (×14): qty 4

## 2021-08-26 MED ORDER — IPRATROPIUM BROMIDE 0.02 % IN SOLN
0.5000 mg | Freq: Once | RESPIRATORY_TRACT | Status: AC
  Administered 2021-08-26: 0.5 mg via RESPIRATORY_TRACT
  Filled 2021-08-26: qty 2.5

## 2021-08-26 MED ORDER — IOHEXOL 350 MG/ML SOLN
60.0000 mL | Freq: Once | INTRAVENOUS | Status: AC | PRN
  Administered 2021-08-26: 60 mL via INTRAVENOUS

## 2021-08-26 MED ORDER — METOPROLOL TARTRATE 5 MG/5ML IV SOLN
2.5000 mg | Freq: Once | INTRAVENOUS | Status: AC
  Administered 2021-08-26: 2.5 mg via INTRAVENOUS
  Filled 2021-08-26: qty 5

## 2021-08-26 NOTE — ED Triage Notes (Signed)
[facility] reports pt coming from [facility]. Staff states pt c/o sob. One duoneb at site and EMS gave another one. Also notes new swelling in her hands and feet. 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

## 2021-08-26 NOTE — Progress Notes (Signed)
VAST consulted to obtain 20G IV access for study. Pt's left arm with abrasion and swollen. Assessed right arm utilizing ultrasound; attempted to place IV x 1 unsuccessful. Vessels are too small and too deep. Placed 2nd assessment request for VAST.

## 2021-08-26 NOTE — ED Provider Notes (Signed)
Care assumed from [place], [place] at shift change, please see their note for full detail, but in brief Gwendolyn [name] is a 86 y.o. female presents with shortness of breath.  Plan: Obtain CTA, wait for return of labs and then admit  Physical Exam  BP (!) 159/91   Pulse (!) 125   Temp (!) 97 F (36.1 C) (Oral)   Resp (!) 23   Ht 5\' 1"  (1.549 m)   Wt 61.2 kg   SpO2 100%   BMI 25.51 kg/m   Physical Exam  Procedures  Procedures  ED Course / MDM   Clinical Course as of 08/26/21 2223  Tue Aug 26, 2021  1351 [phone] - nurse off floor and will call back [BM]  1406 respiratory [BM]  1412 Jasmin Carlton RN. [BM]  1427 Patient on BiPAP reports feeling improved.  Heart rate 120 bpm.  SPO2 98%. [BM]  1753 Spoke with Dr 07-27-1981. She recommends Lasix 40mg  IV q12 for hyervolemic hyponatremia [EC]    Clinical Course User Index [BM] Lacy Duverney, PA-C [EC] , PA-C   Medical Decision Making Amount and/or Complexity of Data Reviewed Labs: ordered. Radiology: ordered.  Risk Prescription drug management. Decision regarding hospitalization.   Labs show sodium of 116 and chest xray concerning for bilateral effusions. Consulted with nephrology and discussed lab findings and plan and Dr Bill Salinas recommends 40mg  Lasix IV q12. Pt maintained on bipap here in the emergency department. There was difficulty with IV access. Both ED RN and IV team were unable to establish access for quite some time but eventually IV access was obtained. CTA concerning for bilateral pleural effusions, no evidence of PE.  I consulted with hospitalist and spoke with Dr Janell Quiet who recommends 2.5 mg metoprolol for her tachycardia and repeat VBG. He agrees to admit patient.        Lacy Duverney 08/26/21 2226    Delight Ovens, MD 08/26/21 2235

## 2021-08-26 NOTE — ED Notes (Signed)
Patient transported to and from CT with RT and RN. Patient A&O, returned to room with monitoring devices attached at this time.

## 2021-08-26 NOTE — Progress Notes (Signed)
Pt transported on bipap to ct and back with no complications.

## 2021-08-26 NOTE — ED Notes (Signed)
IV team at bedside 

## 2021-08-26 NOTE — ED Provider Notes (Signed)
[facility] EMERGENCY DEPARTMENT Provider Note   CSN: [id number] Arrival date & time: 08/26/21  1321     History  Chief Complaint  Patient presents with   Shortness of Breath    Gwendolyn [name] is a 86 y.o. 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  Patient arrives via EMS today for evaluation of shortness of breath, history obtained primarily from RN at bedside who received report from [facility] who are no longer at bedside.  Patient with shortness of breath x1 day received DuoNeb at facility with minimal improvement.  Additionally noted swelling to hands and feet which is new for the patient.  Patient received 2 DuoNebs by EMS.  On evaluation patient reports that she is feeling short of breath she denies any other specific complaint, only answers in one-word yes or no questions  Level 5 caveat acuity of condition HPI     Home Medications Prior to Admission medications   Medication Sig Start Date End Date Taking? Authorizing Provider  amiodarone (PACERONE) 200 MG tablet Place 1 tablet (200 mg total) into feeding tube 2 (two) times daily. 07/11/21   Elmer Picker, NP  apixaban (ELIQUIS) 2.5 MG TABS tablet Take 1 tablet (2.5 mg total) by mouth 2 (two) times daily. 07/16/21   Elmer Picker, NP  aspirin 81 MG chewable tablet Place 1 tablet (81 mg total) into feeding tube daily. 07/12/21   Elmer Picker, NP  atorvastatin (LIPITOR) 40 MG tablet Place 1 tablet (40 mg total) into feeding tube daily. 07/12/21   Elmer Picker, NP  Cholecalciferol (VITAMIN D3) 50000 units CAPS Take 50,000 Units by mouth every Sunday.    [provider]  metoprolol tartrate (LOPRESSOR) 25 mg/10 mL SUSP Place 5 mLs (12.5 mg total) into feeding tube every 8 (eight) hours. 07/11/21   Elmer Picker, NP      Allergies    Patient has no known allergies.    Review of Systems   Review of Systems  Unable to perform  ROS: Acuity of condition    Physical Exam Updated Vital Signs BP (!) 159/91   Pulse (!) 125   Temp (!) 97 F (36.1 C) (Oral)   Resp (!) 23   Ht 5\' 1"  (1.549 m)   Wt 61.2 kg   SpO2 100%   BMI 25.51 kg/m  Physical Exam Constitutional:      General: She is not in acute distress.    Appearance: Normal appearance. She is well-developed. She is obese. She is ill-appearing. She is not diaphoretic.  HENT:     Head: Normocephalic and atraumatic.  Eyes:     General: Vision grossly intact. Gaze aligned appropriately.     Pupils: Pupils are equal, round, and reactive to light.  Neck:     Trachea: Trachea and phonation normal.  Cardiovascular:     Rate and Rhythm: Tachycardia present. Rhythm irregular.  Pulmonary:     Effort: Tachypnea present.     Breath sounds: Decreased air movement present. Wheezing and rhonchi present.  Abdominal:     General: There is no distension.     Palpations: Abdomen is soft.     Tenderness: There is no abdominal tenderness. There is no guarding or rebound.  Musculoskeletal:        General: Normal range of motion.     Cervical back: Normal range of motion.     Right lower leg: 2+ Edema present.     Left  lower leg: 2+ Edema present.  Skin:    General: Skin is warm and dry.  Neurological:     Mental Status: She is alert.     GCS: GCS eye subscore is 4. GCS verbal subscore is 5. GCS motor subscore is 6.     Comments: Speech is clear and goal oriented, follows commands Major Cranial nerves without deficit, no facial droop Moves extremities without ataxia, coordination intact  Psychiatric:        Behavior: Behavior normal.     ED Results / Procedures / Treatments   Labs (all labs ordered are listed, but only abnormal results are displayed) Labs Reviewed  CBC WITH DIFFERENTIAL/PLATELET - Abnormal; Notable for the following components:      Result Value   WBC 13.5 (*)    RBC 2.53 (*)    Hemoglobin 7.7 (*)    HCT 24.0 (*)    RDW 15.9 (*)     Neutro Abs 11.0 (*)    Monocytes Absolute 1.4 (*)    Abs Immature Granulocytes 0.26 (*)    All other components within normal limits  I-STAT CHEM 8, ED - Abnormal; Notable for the following components:   Sodium 116 (*)    Chloride 86 (*)    BUN 33 (*)    Glucose, Bld 128 (*)    Calcium, Ion 1.00 (*)    Hemoglobin 8.2 (*)    HCT 24.0 (*)    All other components within normal limits  I-STAT VENOUS BLOOD GAS, ED - Abnormal; Notable for the following components:   pCO2, Ven 38.2 (*)    pO2, Ven 177 (*)    Sodium 116 (*)    Calcium, Ion 1.02 (*)    HCT 25.0 (*)    Hemoglobin 8.5 (*)    All other components within normal limits  RESP PANEL BY RT-PCR (FLU A&B, COVID) ARPGX2  CULTURE, BLOOD (ROUTINE X 2)  CULTURE, BLOOD (ROUTINE X 2)  COMPREHENSIVE METABOLIC PANEL  BRAIN NATRIURETIC PEPTIDE  LACTIC ACID, PLASMA  LACTIC ACID, PLASMA  SODIUM, URINE, RANDOM  OSMOLALITY, URINE  TROPONIN I (HIGH SENSITIVITY)  TROPONIN I (HIGH SENSITIVITY)    EKG None  Radiology DG Chest Portable 1 View  Result Date: 08/26/2021 CLINICAL DATA:  Dyspnea EXAM: PORTABLE CHEST 1 VIEW COMPARISON:  Radiographs 07/05/2021 FINDINGS: Enlarged cardiomediastinal silhouette. New hazy airspace and interstitial opacities greatest in the lung bases. New layering small-moderate left small right pleural effusions. Aortic calcification. Mitral annular calcification. IMPRESSION: 1. Findings favored to represent pulmonary edema though pneumonia is difficult to exclude. 2. Small-moderate left and probable small right pleural effusions. 3. Stable cardiomegaly. Electronically Signed   By: Minerva Fester M.D.   On: 08/26/2021 13:52    Procedures .Critical Care  Performed by: Bill Salinas, PA-C Authorized by: Bill Salinas, PA-C   Critical care provider statement:    Critical care time (minutes):  40   Critical care was necessary to treat or prevent imminent or life-threatening deterioration of the following  conditions:  Respiratory failure   Critical care was time spent personally by me on the following activities:  Development of treatment plan with patient or surrogate, discussions with consultants, evaluation of patient's response to treatment, examination of patient, ordering and review of laboratory studies, ordering and review of radiographic studies, ordering and performing treatments and interventions, pulse oximetry, re-evaluation of patient's condition and review of old charts     Medications Ordered in ED Medications  albuterol (PROVENTIL) (2.5 MG/3ML)  0.083% nebulizer solution (10 mg/hr Nebulization Given 08/26/21 1429)  ipratropium (ATROVENT) nebulizer solution 0.5 mg (0.5 mg Nebulization Given 08/26/21 1429)    ED Course/ Medical Decision Making/ A&P Clinical Course as of 08/26/21 1524  Tue Aug 26, 2021  1351 (336) [phone] - nurse off floor and will call back [BM]  1406 respiratory [BM]  1412 Jasmin Carlton RN. [BM]  1427 Patient on BiPAP reports feeling improved.  Heart rate 120 bpm.  SPO2 98%. [BM]    Clinical Course User Index [BM] Elizabeth Palau                           Medical Decision Making 86 year old female with history as above presented for shortness of breath and extremity swelling.  Patient presented from nursing facility today symptoms appear to have began this morning with no clear inciting event.  On initial evaluation patient is tachypneic and tachycardic, reports feeling somewhat better on nonrebreather after receiving 3 DuoNebs PTA.  Patient was then transition to BiPAP and reports significant improvement of symptoms.    Labs including CBC, CMP, i-STAT VBG, i-STAT Chem-8, troponin, BNP, COVID/influenza panel, lactic and blood cultures.  Chest x-ray was ordered along with EKG.  Overall given history lower suspicion for infectious process at this point.  Initial concern possible new CHF versus COPD.  Patient on BiPAP and receiving continuous  nebulizer.  Amount and/or Complexity of Data Reviewed Independent Historian:     Details: I spoke with [facility] RN, Donette Larry.  She advises patient was in normal state of health yesterday, this morning on evaluation patient was short of breath and not answering questions appropriately they noted extremity edema.  She reports that patient has been compliant with all medications including metoprolol and amiodarone.  They sent patient to the ER for further treatment.  No additional history provided. External Data Reviewed: notes.    Details: . Reviewed discharge summary from admission on 07/03/2021.  She had been admitted for stroke with right MCA infarct with hemorrhagic transformation suspected due to embolism in the setting of atrial fibrillation, not on anticoagulation.  Appears neurology recommended that the patient start Eliquis 2.5 mg twice daily at [facility] in June.  She was noted to have atrial fibrillation with RVR with a rate in the 160s.  Patient was initially treated with diltiazem but was transitioned to metoprolol 25 mg Q8 down to 12.5 mg Q8.  She was also placed on p.o. amiodarone.  Patient also appears to be on carvedilol and lisinopril for hypertension. Labs: ordered.    Details: I-STAT Chem-8 and VBG show hyponatremia of 116.,  Hypochloremia of 86.  CBC shows leukocytosis of 13.5, anemia of 7.7. Radiology: ordered.    Details: I personally reviewed patient's 1 view chest x-ray, appears as pulmonary edema.  Risk Prescription drug management. Risk Details: I reassessed patient multiple times, she remains on BiPAP, she reports she is feeling somewhat better, she answers simple questions appropriately and follows commands.  SPO2 98%.  I-STAT labs showed hyponatremia, this was discussed with Dr. Posey Rea will await CMP and discussed with admitting team prior to additional treatments, patient's mental status currently intact, no seizure.  Additionally patient is fluid overloaded on  appearance.  Majority of labs are still pending, a CT angio PE study was also ordered given sudden onset of symptoms.  Care handoff given to [facility] PA-C at shift change.  Plan of care is to await pending studies,  reassess and then admit.  Final disposition per oncoming team.  Patient was seen and evaluated by attending physician Dr.Kommor during this visit who agrees with management and plan.    Note: Portions of this report may have been transcribed using voice recognition software. Every effort was made to ensure accuracy; however, inadvertent computerized transcription errors may still be present.         Final Clinical Impression(s) / ED Diagnoses Final diagnoses:  None    Rx / DC Orders ED Discharge Orders     None         Elizabeth Palau 08/26/21 1532    Kommor, Wyn Forster, MD 08/26/21 1739

## 2021-08-27 ENCOUNTER — Inpatient Hospital Stay (HOSPITAL_COMMUNITY): Payer: Medicare Other

## 2021-08-27 ENCOUNTER — Encounter (HOSPITAL_COMMUNITY): Payer: Self-pay | Admitting: Internal Medicine

## 2021-08-27 DIAGNOSIS — I82409 Acute embolism and thrombosis of unspecified deep veins of unspecified lower extremity: Secondary | ICD-10-CM

## 2021-08-27 DIAGNOSIS — J90 Pleural effusion, not elsewhere classified: Secondary | ICD-10-CM

## 2021-08-27 DIAGNOSIS — I5033 Acute on chronic diastolic (congestive) heart failure: Secondary | ICD-10-CM

## 2021-08-27 DIAGNOSIS — I501 Left ventricular failure: Secondary | ICD-10-CM

## 2021-08-27 DIAGNOSIS — I4891 Unspecified atrial fibrillation: Secondary | ICD-10-CM | POA: Diagnosis not present

## 2021-08-27 DIAGNOSIS — R4182 Altered mental status, unspecified: Secondary | ICD-10-CM | POA: Diagnosis not present

## 2021-08-27 DIAGNOSIS — E782 Mixed hyperlipidemia: Secondary | ICD-10-CM

## 2021-08-27 DIAGNOSIS — E871 Hypo-osmolality and hyponatremia: Secondary | ICD-10-CM

## 2021-08-27 DIAGNOSIS — R778 Other specified abnormalities of plasma proteins: Secondary | ICD-10-CM

## 2021-08-27 DIAGNOSIS — E872 Acidosis, unspecified: Secondary | ICD-10-CM

## 2021-08-27 DIAGNOSIS — G9341 Metabolic encephalopathy: Secondary | ICD-10-CM | POA: Diagnosis not present

## 2021-08-27 DIAGNOSIS — R0602 Shortness of breath: Secondary | ICD-10-CM

## 2021-08-27 DIAGNOSIS — R600 Localized edema: Secondary | ICD-10-CM

## 2021-08-27 DIAGNOSIS — D649 Anemia, unspecified: Secondary | ICD-10-CM

## 2021-08-27 LAB — URINALYSIS, ROUTINE W REFLEX MICROSCOPIC
Bilirubin Urine: NEGATIVE
Glucose, UA: NEGATIVE mg/dL
Hgb urine dipstick: NEGATIVE
Ketones, ur: NEGATIVE mg/dL
Leukocytes,Ua: NEGATIVE
Nitrite: NEGATIVE
Protein, ur: NEGATIVE mg/dL
Specific Gravity, Urine: 1.012 (ref 1.005–1.030)
pH: 5 (ref 5.0–8.0)

## 2021-08-27 LAB — CBC
HCT: 25.7 % — ABNORMAL LOW (ref 36.0–46.0)
HCT: 25.6 % — ABNORMAL LOW (ref 36.0–46.0)
HCT: 26.2 % — ABNORMAL LOW (ref 36.0–46.0)
Hemoglobin: 8.6 g/dL — ABNORMAL LOW (ref 12.0–15.0)
Hemoglobin: 8.6 g/dL — ABNORMAL LOW (ref 12.0–15.0)
Hemoglobin: 8.7 g/dL — ABNORMAL LOW (ref 12.0–15.0)
MCH: 30.6 pg (ref 26.0–34.0)
MCH: 30.2 pg (ref 26.0–34.0)
MCH: 29.5 pg (ref 26.0–34.0)
MCHC: 33.5 g/dL (ref 30.0–36.0)
MCHC: 33.6 g/dL (ref 30.0–36.0)
MCHC: 33.2 g/dL (ref 30.0–36.0)
MCV: 91.5 fL (ref 80.0–100.0)
MCV: 89.8 fL (ref 80.0–100.0)
MCV: 88.8 fL (ref 80.0–100.0)
Platelets: 249 10*3/uL (ref 150–400)
Platelets: 240 10*3/uL (ref 150–400)
Platelets: 256 10*3/uL (ref 150–400)
RBC: 2.81 MIL/uL — ABNORMAL LOW (ref 3.87–5.11)
RBC: 2.85 MIL/uL — ABNORMAL LOW (ref 3.87–5.11)
RBC: 2.95 MIL/uL — ABNORMAL LOW (ref 3.87–5.11)
RDW: 16.2 % — ABNORMAL HIGH (ref 11.5–15.5)
RDW: 16.7 % — ABNORMAL HIGH (ref 11.5–15.5)
RDW: 16.6 % — ABNORMAL HIGH (ref 11.5–15.5)
WBC: 10.1 10*3/uL (ref 4.0–10.5)
WBC: 10.2 10*3/uL (ref 4.0–10.5)
WBC: 10.4 10*3/uL (ref 4.0–10.5)
nRBC: 0 % (ref 0.0–0.2)
nRBC: 0 % (ref 0.0–0.2)
nRBC: 0 % (ref 0.0–0.2)

## 2021-08-27 LAB — ECHOCARDIOGRAM COMPLETE
AR max vel: 1.77 cm2
AV Peak grad: 5.8 mmHg
Ao pk vel: 1.2 m/s
Area-P 1/2: 5.5 cm2
Calc EF: 56.2 %
Height: 61 in
MV M vel: 4.61 m/s
MV Peak grad: 85 mmHg
S' Lateral: 2.5 cm
Single Plane A2C EF: 56 %
Single Plane A4C EF: 56.5 %
Weight: 2160 oz

## 2021-08-27 LAB — BLOOD CULTURE ID PANEL (REFLEXED) - BCID2

## 2021-08-27 LAB — RESP PANEL BY RT-PCR (FLU A&B, COVID) ARPGX2
Influenza A by PCR: NEGATIVE
Influenza B by PCR: NEGATIVE
SARS Coronavirus 2 by RT PCR: NEGATIVE

## 2021-08-27 LAB — BASIC METABOLIC PANEL
Anion gap: 16 — ABNORMAL HIGH (ref 5–15)
Anion gap: 15 (ref 5–15)
Anion gap: 11 (ref 5–15)
BUN: 33 mg/dL — ABNORMAL HIGH (ref 8–23)
BUN: 30 mg/dL — ABNORMAL HIGH (ref 8–23)
BUN: 28 mg/dL — ABNORMAL HIGH (ref 8–23)
CO2: 24 mmol/L (ref 22–32)
CO2: 22 mmol/L (ref 22–32)
CO2: 24 mmol/L (ref 22–32)
Calcium: 8.5 mg/dL — ABNORMAL LOW (ref 8.9–10.3)
Calcium: 8.1 mg/dL — ABNORMAL LOW (ref 8.9–10.3)
Calcium: 8.4 mg/dL — ABNORMAL LOW (ref 8.9–10.3)
Chloride: 83 mmol/L — ABNORMAL LOW (ref 98–111)
Chloride: 86 mmol/L — ABNORMAL LOW (ref 98–111)
Chloride: 90 mmol/L — ABNORMAL LOW (ref 98–111)
Creatinine, Ser: 0.99 mg/dL (ref 0.44–1.00)
Creatinine, Ser: 0.9 mg/dL (ref 0.44–1.00)
Creatinine, Ser: 0.89 mg/dL (ref 0.44–1.00)
GFR, Estimated: 57 mL/min — ABNORMAL LOW (ref >60)
GFR, Estimated: >60 mL/min (ref >60)
GFR, Estimated: >60 mL/min (ref >60)
Glucose, Bld: 117 mg/dL — ABNORMAL HIGH (ref 70–99)
Glucose, Bld: 98 mg/dL (ref 70–99)
Glucose, Bld: 84 mg/dL (ref 70–99)
Potassium: 4.8 mmol/L (ref 3.5–5.1)
Potassium: 4.4 mmol/L (ref 3.5–5.1)
Potassium: 4 mmol/L (ref 3.5–5.1)
Sodium: 123 mmol/L — ABNORMAL LOW (ref 135–145)
Sodium: 123 mmol/L — ABNORMAL LOW (ref 135–145)
Sodium: 125 mmol/L — ABNORMAL LOW (ref 135–145)

## 2021-08-27 LAB — TSH: TSH: 22.629 u[IU]/mL — ABNORMAL HIGH (ref 0.350–4.500)

## 2021-08-27 LAB — ABO/RH: ABO/RH(D): O POS

## 2021-08-27 LAB — MAGNESIUM: Magnesium: 1.9 mg/dL (ref 1.7–2.4)

## 2021-08-27 MED ORDER — DIGOXIN 0.25 MG/ML IJ SOLN
0.2500 mg | Freq: Once | INTRAMUSCULAR | Status: AC
  Administered 2021-08-28: 0.25 mg via INTRAVENOUS
  Filled 2021-08-27: qty 1

## 2021-08-27 MED ORDER — AMIODARONE HCL 200 MG PO TABS
200.0000 mg | ORAL_TABLET | Freq: Two times a day (BID) | ORAL | Status: DC
  Administered 2021-08-27: 200 mg
  Filled 2021-08-27: qty 1

## 2021-08-27 MED ORDER — ACETAMINOPHEN 325 MG PO TABS
650.0000 mg | ORAL_TABLET | Freq: Four times a day (QID) | ORAL | Status: DC | PRN
  Administered 2021-09-05 – 2021-09-07 (×4): 650 mg via ORAL
  Filled 2021-08-27 (×4): qty 2

## 2021-08-27 MED ORDER — ACETAMINOPHEN 650 MG RE SUPP: 650.0000 mg | Freq: Four times a day (QID) | RECTAL | Status: DC | PRN

## 2021-08-27 MED ORDER — IPRATROPIUM-ALBUTEROL 0.5-2.5 (3) MG/3ML IN SOLN
3.0000 mL | RESPIRATORY_TRACT | Status: DC | PRN
  Administered 2021-09-01 – 2021-09-04 (×4): 3 mL via RESPIRATORY_TRACT
  Filled 2021-08-27 (×4): qty 3

## 2021-08-27 MED ORDER — ONDANSETRON HCL 4 MG/2ML IJ SOLN: 4.0000 mg | Freq: Four times a day (QID) | INTRAMUSCULAR | Status: DC | PRN

## 2021-08-27 MED ORDER — AMIODARONE LOAD VIA INFUSION
150.0000 mg | Freq: Once | INTRAVENOUS | Status: DC
  Filled 2021-08-27: qty 83.34

## 2021-08-27 MED ORDER — ATORVASTATIN CALCIUM 40 MG PO TABS
40.0000 mg | ORAL_TABLET | Freq: Every day | ORAL | Status: DC
Start: 2021-08-27 — End: 2021-09-09
  Administered 2021-08-27 – 2021-09-09 (×14): 40 mg
  Filled 2021-08-27 (×14): qty 1

## 2021-08-27 MED ORDER — AMIODARONE HCL IN DEXTROSE 360-4.14 MG/200ML-% IV SOLN
60.0000 mg/h | INTRAVENOUS | Status: DC
Start: 2021-08-27 — End: 2021-08-27

## 2021-08-27 MED ORDER — POLYETHYLENE GLYCOL 3350 17 G PO PACK: 17.0000 g | PACK | Freq: Every day | ORAL | Status: DC | PRN

## 2021-08-27 MED ORDER — AMIODARONE HCL IN DEXTROSE 360-4.14 MG/200ML-% IV SOLN
30.0000 mg/h | INTRAVENOUS | Status: DC
Start: 2021-08-27 — End: 2021-08-27

## 2021-08-27 MED ORDER — METOPROLOL TARTRATE 5 MG/5ML IV SOLN
5.0000 mg | Freq: Four times a day (QID) | INTRAVENOUS | Status: DC
  Administered 2021-08-27 – 2021-08-30 (×11): 5 mg via INTRAVENOUS
  Filled 2021-08-27 (×12): qty 5

## 2021-08-27 MED ORDER — ONDANSETRON HCL 4 MG PO TABS: 4.0000 mg | ORAL_TABLET | Freq: Four times a day (QID) | ORAL | Status: DC | PRN

## 2021-08-27 MED ORDER — METOPROLOL TARTRATE 5 MG/5ML IV SOLN
2.5000 mg | Freq: Four times a day (QID) | INTRAVENOUS | Status: DC
  Administered 2021-08-27 (×2): 2.5 mg via INTRAVENOUS
  Filled 2021-08-27 (×2): qty 5

## 2021-08-27 MED ORDER — FUROSEMIDE 10 MG/ML IJ SOLN
40.0000 mg | Freq: Once | INTRAMUSCULAR | Status: AC
  Administered 2021-08-27: 40 mg via INTRAVENOUS
  Filled 2021-08-27: qty 4

## 2021-08-27 MED ORDER — IPRATROPIUM-ALBUTEROL 0.5-2.5 (3) MG/3ML IN SOLN
3.0000 mL | Freq: Four times a day (QID) | RESPIRATORY_TRACT | Status: DC
  Administered 2021-08-27 – 2021-08-28 (×8): 3 mL via RESPIRATORY_TRACT
  Filled 2021-08-27 (×8): qty 3

## 2021-08-27 MED ORDER — DIGOXIN 0.25 MG/ML IJ SOLN
0.2500 mg | Freq: Once | INTRAMUSCULAR | Status: AC
  Administered 2021-08-27: 0.25 mg via INTRAVENOUS
  Filled 2021-08-27: qty 1

## 2021-08-27 NOTE — ED Notes (Signed)
1st unit PRBC infusing  with no adverse , afebrile/respirations unlabored , IV site intact , rate increased to 200 ml/hr.

## 2021-08-27 NOTE — Assessment & Plan Note (Addendum)
Echocardiogram with preserved LV systolic function with EF 50 to 55%, with mild reduced RV systolic function. RVSP 32.4  Pericardial effusion.   CT chest with bilateral ground glass opacities.  Bilateral pleural effusions and compression atelectasis.   Patient received IV furosemide and one dose of metolazone.  Slowly her volume status, improved, by the time of her discharge, she had negative fluid balance -11,296, with significant improvement in her oxygenation and edema.   Plan to continue diuresis with SGLT 2 inh and  furosemide 40 mg daily  Holding on RAS inhibition due to risk of hypotension. Patient with poor prognosis due to neurologic impairment.

## 2021-08-27 NOTE — Progress Notes (Signed)
BIPAP ordered PRN. No unit in room at this time. Patient is stable and breathing well. Will monitor through the night.

## 2021-08-27 NOTE — Progress Notes (Signed)
Left UE venous duplex study completed. Please see CV Proc for preliminary results.  Rydge Texidor BS, RVT 08/27/2021 9:07 AM

## 2021-08-27 NOTE — Assessment & Plan Note (Signed)
   Slightly elevated serial troponins with flat trajectory of elevation  Likely secondary to underlying illness, plaque rupture is unlikely  Monitoring patient on telemetry  

## 2021-08-27 NOTE — Consult Note (Signed)
WOC Nurse Consult Note: Patient receiving care in [facility] ED023 Reason for Consult: RFA wound Wound type: Type 1 skin tear with hematoma on LFA Pressure Injury POA: NA Measurement: 5 x 4 Wound bed: Red Drainage (amount, consistency, odor) Bloody Periwound: erythematous Dressing procedure/placement/frequency: Clean gently with NS, pat dry then apply a cut to fit piece of Xeroform Gauze, cover with 4x4 and wrap with Kerlix. Change daily.  Monitor the wound area(s) for worsening of condition such as: Signs/symptoms of infection, increase in size, development of or worsening of odor, development of pain, or increased pain at the affected locations.   Notify the medical team if any of these develop.  Thank you for the consult. WOC nurse will not follow at this time.   Please re-consult the WOC team if needed.  Renaldo Reel Katrinka Blazing, MSN, RN, CMSRN, Angus Seller, [facility] Wound Treatment Associate Pager [phone]

## 2021-08-27 NOTE — Assessment & Plan Note (Addendum)
See assessment and plan above 

## 2021-08-27 NOTE — ED Notes (Signed)
Transported to CT scan

## 2021-08-27 NOTE — Progress Notes (Signed)
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 In the ED she was found to be in A-fib with RVR and respiratory distress.  She was placed on BiPAP for increased work of breathing.  Due to volume overload patient was given IV Lasix. Patient was also found to be in metabolic encephalopathy.  Vitals:   08/27/21 1230 08/27/21 1300  BP: (!) 145/94 (!) 123/99  Pulse: (!) 131 (!) 101  Resp: (!) 22 (!) 24  Temp:  98.2 F (36.8 C)  SpO2: 99% 99%      A/P Atrial fibrillation with RVR Acute on chronic diastolic CHF Acute metabolic encephalopathy Hyponatremia Normocytic anemia Lactic acidosis Troponin elevation Pulmonary edema Edema of left upper extremity  Patient started on amiodarone, IV metoprolol Anticoagulation on hold for now CT head negative for acute intracranial hemorrhage Consult cardiology for further recommendations  Metabolic encephalopathy -Likely multifactorial from A-fib, hyponatremia, CHF -We will continue to monitor patient's mental status  S/p 1 unit PRBC for anemia Anticoagulation on hold as above   Sodium 123, likely hypervolemic hyponatremia -Continue diuresis with IV Lasix  No evidence of DVT in the left upper extremity -Finding consistent with indeterminate superficial vein thrombosis involving the left basilic vein    Meredeth Ide [facility]

## 2021-08-27 NOTE — Assessment & Plan Note (Signed)
   Lactic acidosis likely secondary to transient hypoxia in the setting of pulm edema and bilateral pleural effusions  Treating underlying condition with intermittent BiPAP, supplemental oxygen and intravenous diuretics  Monitoring serial lactic acid levels to ensure downtrending and resolution.

## 2021-08-27 NOTE — ED Notes (Signed)
Patient signed consent form for blood transfusion . 

## 2021-08-27 NOTE — Assessment & Plan Note (Addendum)
Patient required IV metoprolol last night, looks that she missed on enteral dose during the day.   This am HR in the 70.s continue with atrial fibrillation rhythm.   Continue anticoagulation with apixaban.

## 2021-08-27 NOTE — Progress Notes (Signed)
EEG complete - results pending 

## 2021-08-27 NOTE — Procedures (Signed)
[name]  MRN: [id number]  Epilepsy Attending: Charlsie Quest  Referring Physician/Provider: Marinda Elk, MD  Date: 08/27/2021 Duration: 22.02 mins  Patient history: 85yo f with ams. EEG to evaluate for seizure  Level of alertness: Awake  AEDs during EEG study: None  Technical aspects: This EEG study was done with scalp electrodes positioned according to the 10-20 International system of electrode placement. Electrical activity was acquired at a sampling rate of 500Hz  and reviewed with a high frequency filter of 70Hz  and a low frequency filter of 1Hz . EEG data were recorded continuously and digitally stored.   Description: No clear posterior dominant rhythm was seen. EEG showed continuous generalized 3 to 6 Hz theta-delta slowing.  Hyperventilation and photic stimulation were not performed.     ABNORMALITY -Continuous slow, generalized  IMPRESSION: This study is suggestive of moderate diffuse encephalopathy, nonspecific etiology. No seizures or epileptiform discharges were seen throughout the recording.  Marla Pouliot 

## 2021-08-27 NOTE — ED Notes (Signed)
ED TO INPATIENT HANDOFF REPORT  ED Nurse Name and Phone #: [phone]   S Name/Age/Gender Gwendolyn [name] 86 y.o. female Room/Bed: 008C/008C  Code Status   Code Status: Full Code  Home/SNF/Other Skilled nursing facility Patient oriented to:   Is this baseline? Yes   Triage Complete: Triage complete  Chief Complaint Atrial fibrillation with rapid ventricular response (HCC) [I48.91]  Triage Note [facility] reports pt coming from [facility]. Staff states pt c/o sob. One duoneb at site and EMS gave another one. Also notes new swelling in her hands and feet. 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   Allergies No Known Allergies  Level of Care/Admitting Diagnosis ED Disposition     ED Disposition  Admit   Condition  --   Comment  Hospital Area: [facility] [[id number]]  Level of Care: Progressive [102]  Admit to Progressive based on following criteria: CARDIOVASCULAR & THORACIC of moderate stability with acute coronary syndrome symptoms/low risk myocardial infarction/hypertensive urgency/arrhythmias/heart failure potentially compromising stability and stable post cardiovascular intervention patients.  May admit patient to [facility] or [facility] if equivalent level of care is available:: No  Covid Evaluation: Asymptomatic - no recent exposure (last 10 days) testing not required  Diagnosis: Atrial fibrillation with rapid ventricular response [facility]) [[id number]]  Admitting Physician: Marinda Elk [[id number]]  Attending Physician: Marinda Elk [[id number]]  Certification:: I certify this patient will need inpatient services for at least 2 midnights  Estimated Length of Stay: 3          B Medical/Surgery History Past Medical History:  Diagnosis Date   A-fib [facility])    Atrial fibrillation (HCC)    Hypertension    Loss of consciousness (HCC)    Syncope    Past Surgical History:  Procedure Laterality Date   BARTHOLIN GLAND CYST EXCISION      ESOPHAGOGASTRODUODENOSCOPY (EGD) WITH PROPOFOL N/A 07/08/2021   Procedure: ESOPHAGOGASTRODUODENOSCOPY (EGD) WITH PROPOFOL;  Surgeon: Violeta Gelinas, MD;  Location: [facility] ENDOSCOPY;  Service: General;  Laterality: N/A;   PEG PLACEMENT N/A 07/08/2021   Procedure: PERCUTANEOUS ENDOSCOPIC GASTROSTOMY (PEG) PLACEMENT;  Surgeon: Violeta Gelinas, MD;  Location: [facility] ENDOSCOPY;  Service: General;  Laterality: N/A;     A IV Location/Drains/Wounds Patient Lines/Drains/Airways Status     Active Line/Drains/Airways     Name Placement date Placement time Site Days   Peripheral IV 08/26/21 22 G Anterior;Right Forearm 08/26/21  1427  Forearm  1   Peripheral IV 08/26/21 20 G 2.5" Anterior;Right;Upper Arm 08/26/21  1845  Arm  1   Gastrostomy/Enterostomy Percutaneous endoscopic gastrostomy (PEG) 24 Fr. LUQ 07/08/21  1155  LUQ  50   Pressure Injury 07/03/21 Sacrum Medial Deep Tissue Pressure Injury - Purple or maroon localized area of discolored intact skin or blood-filled blister due to damage of underlying soft tissue from pressure and/or shear. 07/03/21  1642  -- 55   Pressure Injury 07/05/21 Foot Anterior;Left Stage 2 -  Partial thickness loss of dermis presenting as a shallow open injury with a red, pink wound bed without slough. 07/05/21  1830  -- 53            Intake/Output Last 24 hours  Intake/Output Summary (Last 24 hours) at 08/27/2021 1338 Last data filed at 08/27/2021 0604 Gross per 24 hour  Intake 300 ml  Output 1000 ml  Net -700 ml    Labs/Imaging Results for orders placed or performed during the hospital encounter of 08/26/21 (from the past 48 hour(s))  Comprehensive metabolic panel     Status: Abnormal   Collection Time: 08/26/21  2:28 PM  Result Value Ref Range   Sodium 119 (LL) 135 - 145 mmol/L    Comment: CRITICAL RESULT CALLED TO, READ BACK BY AND VERIFIED WITH P. BLANCHARD RN, [phone], 08/26/21, EADEDOKUN   Potassium 4.8 3.5 - 5.1 mmol/L   Chloride 84 (L) 98 - 111 mmol/L   CO2 21  (L) 22 - 32 mmol/L   Glucose, Bld 119 (H) 70 - 99 mg/dL    Comment: Glucose reference range applies only to samples taken after fasting for at least 8 hours.   BUN 37 (H) 8 - 23 mg/dL   Creatinine, Ser 5.40 0.44 - 1.00 mg/dL   Calcium 8.3 (L) 8.9 - 10.3 mg/dL   Total Protein 7.0 6.5 - 8.1 g/dL   Albumin 2.7 (L) 3.5 - 5.0 g/dL   AST 86 (H) 15 - 41 U/L   ALT 134 (H) 0 - 44 U/L   Alkaline Phosphatase 220 (H) 38 - 126 U/L   Total Bilirubin 0.7 0.3 - 1.2 mg/dL   GFR, Estimated >98 >11 mL/min    Comment: (NOTE) Calculated using the CKD-EPI Creatinine Equation (2021)    Anion gap 14 5 - 15    Comment: Performed at [facility], [address] [place], [place] [address]  CBC with Differential     Status: Abnormal   Collection Time: 08/26/21  2:28 PM  Result Value Ref Range   WBC 13.5 (H) 4.0 - 10.5 K/uL   RBC 2.53 (L) 3.87 - 5.11 MIL/uL   Hemoglobin 7.7 (L) 12.0 - 15.0 g/dL   HCT 29.5 (L) 62.1 - 30.8 %   MCV 94.9 80.0 - 100.0 fL   MCH 30.4 26.0 - 34.0 pg   MCHC 32.1 30.0 - 36.0 g/dL   RDW 65.7 (H) 84.6 - 96.2 %   Platelets 272 150 - 400 K/uL   nRBC 0.0 0.0 - 0.2 %   Neutrophils Relative % 81 %   Neutro Abs 11.0 (H) 1.7 - 7.7 K/uL   Lymphocytes Relative 6 %   Lymphs Abs 0.8 0.7 - 4.0 K/uL   Monocytes Relative 10 %   Monocytes Absolute 1.4 (H) 0.1 - 1.0 K/uL   Eosinophils Relative 1 %   Eosinophils Absolute 0.1 0.0 - 0.5 K/uL   Basophils Relative 0 %   Basophils Absolute 0.0 0.0 - 0.1 K/uL   Immature Granulocytes 2 %   Abs Immature Granulocytes 0.26 (H) 0.00 - 0.07 K/uL    Comment: Performed at [facility], [address], [place], [place] [address]  Brain natriuretic peptide     Status: Abnormal   Collection Time: 08/26/21  2:28 PM  Result Value Ref Range   B Natriuretic Peptide 437.4 (H) 0.0 - 100.0 pg/mL    Comment: Performed at [facility], [address] [place], [place] [address]  Troponin I (High Sensitivity)     Status: Abnormal   Collection Time:  08/26/21  2:28 PM  Result Value Ref Range   Troponin I (High Sensitivity) 24 (H) <18 ng/L    Comment: (NOTE) Elevated high sensitivity troponin I (hsTnI) values and significant  changes across serial measurements may suggest ACS but many other  chronic and acute conditions are known to elevate hsTnI results.  Refer to the Links section for chest pain algorithms and additional  guidance. Performed at [facility], [address]  [place], [place] [address]   Lactic acid, plasma     Status: Abnormal   Collection Time: 08/26/21  2:28 PM  Result Value Ref Range   Lactic Acid, Venous 2.1 (HH) 0.5 - 1.9 mmol/L    Comment: CRITICAL RESULT CALLED TO, READ BACK BY AND VERIFIED WITH P. . BLANCHARD RN, [id number], 08/26/21, EADEDOKUN Performed at [facility], [address] [place], [place] [address]   Blood culture (routine x 2)     Status: None (Preliminary result)   Collection Time: 08/26/21  2:28 PM   Specimen: BLOOD RIGHT WRIST  Result Value Ref Range   Specimen Description BLOOD RIGHT WRIST    Special Requests      BOTTLES DRAWN AEROBIC AND ANAEROBIC Blood Culture adequate volume   Culture      NO GROWTH < 24 HOURS Performed at [facility], [address], [place], [place] [address]    Report Status PENDING   Blood culture (routine x 2)     Status: None (Preliminary result)   Collection Time: 08/26/21  2:42 PM   Specimen: BLOOD LEFT ARM  Result Value Ref Range   Specimen Description BLOOD LEFT ARM    Special Requests      BOTTLES DRAWN AEROBIC AND ANAEROBIC Blood Culture adequate volume   Culture      NO GROWTH < 12 HOURS Performed at [facility], [address] [place], [place] [address]    Report Status PENDING   I-stat chem 8, ED (not at [facility] or [facility])     Status: Abnormal   Collection Time: 08/26/21  2:45 PM  Result Value Ref Range   Sodium 116 (LL) 135 - 145 mmol/L   Potassium 4.7 3.5 - 5.1 mmol/L   Chloride 86 (L) 98 - 111 mmol/L   BUN 33 (H) 8 -  23 mg/dL   Creatinine, Ser 6.57 0.44 - 1.00 mg/dL   Glucose, Bld 846 (H) 70 - 99 mg/dL    Comment: Glucose reference range applies only to samples taken after fasting for at least 8 hours.   Calcium, Ion 1.00 (L) 1.15 - 1.40 mmol/L   TCO2 24 22 - 32 mmol/L   Hemoglobin 8.2 (L) 12.0 - 15.0 g/dL   HCT 96.2 (L) 95.2 - 84.1 %   Comment NOTIFIED PHYSICIAN   I-Stat venous blood gas, [facility] ED only)     Status: Abnormal   Collection Time: 08/26/21  2:45 PM  Result Value Ref Range   pH, Ven 7.415 7.25 - 7.43   pCO2, Ven 38.2 (L) 44 - 60 mmHg   pO2, Ven 177 (H) 32 - 45 mmHg   Bicarbonate 24.5 20.0 - 28.0 mmol/L   TCO2 26 22 - 32 mmol/L   O2 Saturation 100 %   Acid-Base Excess 0.0 0.0 - 2.0 mmol/L   Sodium 116 (LL) 135 - 145 mmol/L   Potassium 4.7 3.5 - 5.1 mmol/L   Calcium, Ion 1.02 (L) 1.15 - 1.40 mmol/L   HCT 25.0 (L) 36.0 - 46.0 %   Hemoglobin 8.5 (L) 12.0 - 15.0 g/dL   Sample type VENOUS    Comment NOTIFIED PHYSICIAN   Troponin I (High Sensitivity)     Status: Abnormal   Collection Time: 08/26/21  5:00 PM  Result Value Ref Range   Troponin I (High Sensitivity) 22 (H) <18 ng/L    Comment: (NOTE) Elevated high sensitivity troponin I (hsTnI) values and significant  changes across serial measurements may suggest  ACS but many other  chronic and acute conditions are known to elevate hsTnI results.  Refer to the "Links" section for chest pain algorithms and additional  guidance. Performed at [facility], [address], [place], [place] [address]   Sodium, urine, random     Status: None   Collection Time: 08/26/21  9:12 PM  Result Value Ref Range   Sodium, Ur 23 mmol/L    Comment: Performed at [facility], [address] [place], [place] [address]  Osmolality, urine     Status: Abnormal   Collection Time: 08/26/21  9:12 PM  Result Value Ref Range   Osmolality, Ur 279 (L) 300 - 900 mOsm/kg    Comment: Performed at [facility], [address], [place], [place]  [address]  Lactic acid, plasma     Status: None   Collection Time: 08/26/21 10:30 PM  Result Value Ref Range   Lactic Acid, Venous 1.8 0.5 - 1.9 mmol/L    Comment: Performed at [facility], [address] [place], [place] [address]  I-Stat venous blood gas, [facility] ED only)     Status: Abnormal   Collection Time: 08/26/21 10:39 PM  Result Value Ref Range   pH, Ven 7.535 (H) 7.25 - 7.43   pCO2, Ven 29.8 (L) 44 - 60 mmHg   pO2, Ven 111 (H) 32 - 45 mmHg   Bicarbonate 25.2 20.0 - 28.0 mmol/L   TCO2 26 22 - 32 mmol/L   O2 Saturation 99 %   Acid-Base Excess 2.0 0.0 - 2.0 mmol/L   Sodium 118 (LL) 135 - 145 mmol/L   Potassium 4.6 3.5 - 5.1 mmol/L   Calcium, Ion 1.02 (L) 1.15 - 1.40 mmol/L   HCT 19.0 (L) 36.0 - 46.0 %   Hemoglobin 6.5 (LL) 12.0 - 15.0 g/dL   Sample type VENOUS   Resp Panel by RT-PCR (Flu A&B, Covid) Anterior Nasal Swab     Status: None   Collection Time: 08/26/21 11:18 PM   Specimen: Anterior Nasal Swab  Result Value Ref Range   SARS Coronavirus 2 by RT PCR NEGATIVE NEGATIVE    Comment: (NOTE) SARS-CoV-2 target nucleic acids are NOT DETECTED.  The SARS-CoV-2 RNA is generally detectable in upper respiratory specimens during the acute phase of infection. The lowest concentration of SARS-CoV-2 viral copies this assay can detect is 138 copies/mL. A negative result does not preclude SARS-Cov-2 infection and should not be used as the sole basis for treatment or other patient management decisions. A negative result may occur with  improper specimen collection/handling, submission of specimen other than nasopharyngeal swab, presence of viral mutation(s) within the areas targeted by this assay, and inadequate number of viral copies(<138 copies/mL). A negative result must be combined with clinical observations, patient history, and epidemiological information. The expected result is Negative.  Fact Sheet for Patients:  BloggerCourse.com  Fact Sheet  for Healthcare Providers:  SeriousBroker.it  This test is no t yet approved or cleared by the [place] FDA and  has been authorized for detection and/or diagnosis of SARS-CoV-2 by FDA under an Emergency Use Authorization (EUA). This EUA will remain  in effect (meaning this test can be used) for the duration of the COVID-19 declaration under Section 564(b)(1) of the Act, 21 U.S.C.section 360bbb-3(b)(1), unless the authorization is terminated  or revoked sooner.       Influenza A by PCR NEGATIVE NEGATIVE   Influenza B by PCR NEGATIVE NEGATIVE  Comment: (NOTE) The Xpert Xpress SARS-CoV-2/FLU/RSV plus assay is intended as an aid in the diagnosis of influenza from Nasopharyngeal swab specimens and should not be used as a sole basis for treatment. Nasal washings and aspirates are unacceptable for Xpert Xpress SARS-CoV-2/FLU/RSV testing.  Fact Sheet for Patients: BloggerCourse.com  Fact Sheet for Healthcare Providers: SeriousBroker.it  This test is not yet approved or cleared by the [place] FDA and has been authorized for detection and/or diagnosis of SARS-CoV-2 by FDA under an Emergency Use Authorization (EUA). This EUA will remain in effect (meaning this test can be used) for the duration of the COVID-19 declaration under Section 564(b)(1) of the Act, 21 U.S.C. section 360bbb-3(b)(1), unless the authorization is terminated or revoked.  Performed at [facility] Lab, [address] [place], [place] [address]   TSH     Status: Abnormal   Collection Time: 08/26/21 11:25 PM  Result Value Ref Range   TSH 22.629 (H) 0.350 - 4.500 uIU/mL    Comment: Performed by a 3rd Generation assay with a functional sensitivity of <=0.01 uIU/mL. Performed at [facility], [address], [place], [place] [address]   Basic metabolic panel     Status: Abnormal   Collection Time: 08/26/21 11:27 PM   Result Value Ref Range   Sodium 120 (L) 135 - 145 mmol/L   Potassium 4.8 3.5 - 5.1 mmol/L   Chloride 85 (L) 98 - 111 mmol/L   CO2 23 22 - 32 mmol/L   Glucose, Bld 111 (H) 70 - 99 mg/dL    Comment: Glucose reference range applies only to samples taken after fasting for at least 8 hours.   BUN 35 (H) 8 - 23 mg/dL   Creatinine, Ser 0.98 0.44 - 1.00 mg/dL   Calcium 8.0 (L) 8.9 - 10.3 mg/dL   GFR, Estimated >11 >91 mL/min    Comment: (NOTE) Calculated using the CKD-EPI Creatinine Equation (2021)    Anion gap 12 5 - 15    Comment: Performed at [facility] Lab, [address] [place], [place] [address]  CBC     Status: Abnormal   Collection Time: 08/26/21 11:27 PM  Result Value Ref Range   WBC 9.7 4.0 - 10.5 K/uL   RBC 2.13 (L) 3.87 - 5.11 MIL/uL   Hemoglobin 6.6 (LL) 12.0 - 15.0 g/dL    Comment: REPEATED TO VERIFY THIS CRITICAL RESULT HAS VERIFIED AND BEEN CALLED TO BOBBY SANGALANG, RN BY HAYLEE HOWARD ON 07 18 2023 AT 2341, AND HAS BEEN READ BACK.     HCT 19.6 (L) 36.0 - 46.0 %   MCV 92.0 80.0 - 100.0 fL   MCH 31.0 26.0 - 34.0 pg   MCHC 33.7 30.0 - 36.0 g/dL   RDW 56.2 (H) 13.0 - 86.5 %   Platelets 237 150 - 400 K/uL   nRBC 0.0 0.0 - 0.2 %    Comment: Performed at [facility], [address], [place], [place] [address]  ABO/Rh     Status: None   Collection Time: 08/26/21 11:39 PM  Result Value Ref Range   ABO/RH(D)      O POS Performed at [facility], [address], [place], [place] [address]   Type and screen [facility]     Status: None (Preliminary result)   Collection Time: 08/26/21 11:40 PM  Result Value Ref Range   ABO/RH(D) O POS    Antibody Screen NEG  Sample Expiration 08/29/2021,2359    Unit Number [id number]    Blood Component Type RED CELLS,LR    Unit division 00    Status of Unit ISSUED    Transfusion Status OK TO TRANSFUSE    Crossmatch Result      Compatible Performed at [facility], [address],  [place], [place] [address]   Prepare RBC (crossmatch)     Status: None   Collection Time: 08/27/21 12:00 AM  Result Value Ref Range   Order Confirmation      ORDER PROCESSED BY BLOOD BANK Performed at [facility], [address], [place], [place] [address]   Urinalysis, Routine w reflex microscopic Urine, Clean Catch     Status: None   Collection Time: 08/27/21  1:35 AM  Result Value Ref Range   Color, Urine YELLOW YELLOW   APPearance CLEAR CLEAR   Specific Gravity, Urine 1.012 1.005 - 1.030   pH 5.0 5.0 - 8.0   Glucose, UA NEGATIVE NEGATIVE mg/dL   Hgb urine dipstick NEGATIVE NEGATIVE   Bilirubin Urine NEGATIVE NEGATIVE   Ketones, ur NEGATIVE NEGATIVE mg/dL   Protein, ur NEGATIVE NEGATIVE mg/dL   Nitrite NEGATIVE NEGATIVE   Leukocytes,Ua NEGATIVE NEGATIVE    Comment: Performed at [facility] Lab, [address] [place], [place] [address]  Magnesium     Status: None   Collection Time: 08/27/21  4:05 AM  Result Value Ref Range   Magnesium 1.9 1.7 - 2.4 mg/dL    Comment: Performed at [facility], [address] [place], [place] [address]  CBC     Status: Abnormal   Collection Time: 08/27/21  4:05 AM  Result Value Ref Range   WBC 10.1 4.0 - 10.5 K/uL   RBC 2.81 (L) 3.87 - 5.11 MIL/uL   Hemoglobin 8.6 (L) 12.0 - 15.0 g/dL    Comment: REPEATED TO VERIFY POST TRANSFUSION SPECIMEN    HCT 25.7 (L) 36.0 - 46.0 %   MCV 91.5 80.0 - 100.0 fL   MCH 30.6 26.0 - 34.0 pg   MCHC 33.5 30.0 - 36.0 g/dL   RDW 29.5 (H) 28.4 - 13.2 %   Platelets 249 150 - 400 K/uL   nRBC 0.0 0.0 - 0.2 %    Comment: Performed at [facility] [place] [facility], [address], [place], [place] [address]  Basic metabolic panel     Status: Abnormal   Collection Time: 08/27/21  4:05 AM  Result Value Ref Range   Sodium 123 (L) 135 - 145 mmol/L   Potassium 4.8 3.5 - 5.1 mmol/L   Chloride 83 (L) 98 - 111 mmol/L   CO2 24 22 - 32 mmol/L   Glucose, Bld 117 (H) 70 - 99 mg/dL    Comment: Glucose reference range  applies only to samples taken after fasting for at least 8 hours.   BUN 33 (H) 8 - 23 mg/dL   Creatinine, Ser 2.72 0.44 - 1.00 mg/dL   Calcium 8.5 (L) 8.9 - 10.3 mg/dL   GFR, Estimated 57 (L) >60 mL/min    Comment: (NOTE) Calculated using the CKD-EPI Creatinine Equation (2021)    Anion gap 16 (H) 5 - 15    Comment: Performed at [facility] Lab, [address] [place], [place] [address]  CBC     Status: Abnormal   Collection Time: 08/27/21 12:40 PM  Result Value Ref Range   WBC 10.2 4.0 - 10.5 K/uL  RBC 2.85 (L) 3.87 - 5.11 MIL/uL   Hemoglobin 8.6 (L) 12.0 - 15.0 g/dL   HCT 25.4 (L) 27.0 - 62.3 %   MCV 89.8 80.0 - 100.0 fL   MCH 30.2 26.0 - 34.0 pg   MCHC 33.6 30.0 - 36.0 g/dL   RDW 76.2 (H) 83.1 - 51.7 %   Platelets 240 150 - 400 K/uL   nRBC 0.0 0.0 - 0.2 %    Comment: Performed at [facility] Lab, [address] [place], [place] [address]  Basic metabolic panel     Status: Abnormal   Collection Time: 08/27/21 12:40 PM  Result Value Ref Range   Sodium 123 (L) 135 - 145 mmol/L   Potassium 4.4 3.5 - 5.1 mmol/L   Chloride 86 (L) 98 - 111 mmol/L   CO2 22 22 - 32 mmol/L   Glucose, Bld 98 70 - 99 mg/dL    Comment: Glucose reference range applies only to samples taken after fasting for at least 8 hours.   BUN 30 (H) 8 - 23 mg/dL   Creatinine, Ser 3.71 0.44 - 1.00 mg/dL   Calcium 8.1 (L) 8.9 - 10.3 mg/dL   GFR, Estimated >06 >26 mL/min    Comment: (NOTE) Calculated using the CKD-EPI Creatinine Equation (2021)    Anion gap 15 5 - 15    Comment: Performed at [facility] Lab, [address], [place], [place] [address]   VAS [place] UPPER EXTREMITY VENOUS DUPLEX  Result Date: 08/27/2021 UPPER VENOUS STUDY  Patient Name:  MAURYA Schaumburg  Date of Exam:   08/27/2021 Medical Rec #: [id number]         Accession #:    [id number] Date of Birth: 10/10/1935        Patient Gender: F Patient Age:   42 years Exam Location:  [facility] Procedure:      VAS [place] UPPER EXTREMITY VENOUS  DUPLEX Referring Phys: Shauna Hugh --------------------------------------------------------------------------------  Indications: "DVT" Comparison Study: No previous exam noted. Performing Technologist: Magdalene River BS, RVT  Examination Guidelines: A complete evaluation includes B-mode imaging, spectral Doppler, color Doppler, and power Doppler as needed of all accessible portions of each vessel. Bilateral testing is considered an integral part of a complete examination. Limited examinations for reoccurring indications may be performed as noted.  Right Findings: +----------+------------+---------+-----------+----------+-------+ RIGHT     CompressiblePhasicitySpontaneousPropertiesSummary +----------+------------+---------+-----------+----------+-------+ Subclavian    Full       Yes       Yes                      +----------+------------+---------+-----------+----------+-------+  Left Findings: +----------+------------+---------+-----------+----------+-------+ LEFT      CompressiblePhasicitySpontaneousPropertiesSummary +----------+------------+---------+-----------+----------+-------+ IJV           Full       Yes       Yes                      +----------+------------+---------+-----------+----------+-------+ Subclavian    Full       Yes       Yes                      +----------+------------+---------+-----------+----------+-------+ Axillary      Full       Yes       Yes                      +----------+------------+---------+-----------+----------+-------+ Brachial  Full                                          +----------+------------+---------+-----------+----------+-------+ Radial        Full                                          +----------+------------+---------+-----------+----------+-------+ Ulnar         Full                                          +----------+------------+---------+-----------+----------+-------+ Cephalic       Full                                          +----------+------------+---------+-----------+----------+-------+ Basilic     Partial      Yes       Yes                      +----------+------------+---------+-----------+----------+-------+ The left basilic vein at the mid upper arm is partially compressible with no obstruction of flow throughout.  Summary:  Right: No evidence of thrombosis in the subclavian.  Left: No evidence of deep vein thrombosis in the upper extremity. Findings consistent with age indeterminate superficial vein thrombosis involving the left basilic vein. Wall thickening noted in the basilic at the mid and distal upper arm.  *See table(s) above for measurements and observations.  Diagnosing physician: Sherald Hess MD Electronically signed by Sherald Hess MD on 08/27/2021 at 1:01:53 PM.    Final    ECHOCARDIOGRAM COMPLETE  Result Date: 08/27/2021    ECHOCARDIOGRAM REPORT   Patient Name:   [facility] Date of Exam: 08/27/2021 Medical Rec #:  [id number]        Height:       61.0 in Accession #:    [id number]       Weight:       135.0 lb Date of Birth:  1935-04-28       BSA:          1.598 m Patient Age:    85 years         BP:           131/83 mmHg Patient Gender: F                HR:           113 bpm. Exam Location:  Inpatient Procedure: 2D Echo, Cardiac Doppler and Color Doppler Indications:    CHF  History:        Patient has prior history of Echocardiogram examinations.                 Arrythmias:Atrial Fibrillation; Risk Factors:Hypertension.  Sonographer:    Cleatis Polka Referring Phys: [id number] Deno Lunger SHAL[place]B IMPRESSIONS  1. Left ventricular ejection fraction, by estimation, is 50 to 55%. The left ventricle has low normal function. The left ventricle has no regional wall motion abnormalities. Diastolic function indeterminant due to Afib.  2. Right ventricular systolic function is mildly reduced. The right ventricular size is normal. There  is normal pulmonary  artery systolic pressure. The estimated right ventricular systolic pressure is 32.4 mmHg.  3. Left atrial size was mild to moderately dilated.  4. Right atrial size was mildly dilated.  5. There is a moderate sized, circumferential perficardial effusion present that appears largest around the RA/RV base. The pericardial effusion is circumferential. There is no evidence of cardiac tamponade. There is prominent epicardial fat present.  6. The mitral valve is degenerative. Trivial mitral valve regurgitation. Severe mitral annular calcification.  7. The aortic valve is tricuspid. There is moderate calcification of the aortic valve. There is moderate thickening of the aortic valve. Aortic valve regurgitation is not visualized. Aortic valve sclerosis/calcification is present, without any evidence of aortic stenosis.  8. The inferior vena cava is dilated in size with >50% respiratory variability, suggesting right atrial pressure of 8 mmHg. Comparison(s): Compared to prior TTE, the pericardial effusion is now moderate (previously trivial). Otherwise, there is no significant change. FINDINGS  Left Ventricle: Left ventricular ejection fraction, by estimation, is 50 to 55%. The left ventricle has low normal function. The left ventricle has no regional wall motion abnormalities. The left ventricular internal cavity size was normal in size. There is no left ventricular hypertrophy. Diastolic function indeterminant due to Afib. Right Ventricle: The right ventricular size is normal. No increase in right ventricular wall thickness. Right ventricular systolic function is mildly reduced. There is normal pulmonary artery systolic pressure. The tricuspid regurgitant velocity is 2.47 m/s, and with an assumed right atrial pressure of 8 mmHg, the estimated right ventricular systolic pressure is 32.4 mmHg. Left Atrium: Left atrial size was mild to moderately dilated. Right Atrium: Right atrial size was mildly dilated. Pericardium: There is  very prominent epicardial fat present. There is a moderate sized, circumferential perficardial effusion present that appears largest around the RA/RV base. The pericardial effusion is circumferential. There is no evidence of cardiac  tamponade. Mitral Valve: The mitral valve is degenerative in appearance. There is mild thickening of the mitral valve leaflet(s). There is mild calcification of the mitral valve leaflet(s). Severe mitral annular calcification. Trivial mitral valve regurgitation. Tricuspid Valve: The tricuspid valve is normal in structure. Tricuspid valve regurgitation is mild. Aortic Valve: The aortic valve is tricuspid. There is moderate calcification of the aortic valve. There is moderate thickening of the aortic valve. Aortic valve regurgitation is not visualized. Aortic valve sclerosis/calcification is present, without any  evidence of aortic stenosis. Aortic valve peak gradient measures 5.8 mmHg. Pulmonic Valve: The pulmonic valve was normal in structure. Pulmonic valve regurgitation is trivial. Aorta: The aortic root and ascending aorta are structurally normal, with no evidence of dilitation. Venous: The inferior vena cava is dilated in size with greater than 50% respiratory variability, suggesting right atrial pressure of 8 mmHg. IAS/Shunts: The atrial septum is grossly normal. Additional Comments: There is a small pleural effusion in the left lateral region.  LEFT VENTRICLE PLAX 2D LVIDd:         3.30 cm     Diastology LVIDs:         2.50 cm     LV e' medial:    7.07 cm/s LV PW:         1.20 cm     LV E/e' medial:  14.9 LV IVS:        1.00 cm     LV e' lateral:   7.72 cm/s LVOT diam:     1.80 cm     LV E/e' lateral: 13.6 LV  SV:         32 LV SV Index:   20 LVOT Area:     2.54 cm  LV Volumes (MOD) LV vol d, MOD A2C: 47.3 ml LV vol d, MOD A4C: 40.7 ml LV vol s, MOD A2C: 20.8 ml LV vol s, MOD A4C: 17.7 ml LV SV MOD A2C:     26.5 ml LV SV MOD A4C:     40.7 ml LV SV MOD BP:      24.9 ml RIGHT  VENTRICLE            IVC RV Basal diam:  2.80 cm    IVC diam: 2.00 cm RV Mid diam:    1.90 cm RV S prime:     9.25 cm/s TAPSE (M-mode): 1.2 cm LEFT ATRIUM             Index        RIGHT ATRIUM           Index LA diam:        3.70 cm 2.32 cm/m   RA Area:     15.50 cm LA Vol (A2C):   48.6 ml 30.41 ml/m  RA Volume:   36.20 ml  22.65 ml/m LA Vol (A4C):   49.8 ml 31.16 ml/m LA Biplane Vol: 50.6 ml 31.66 ml/m  AORTIC VALVE AV Area (Vmax): 1.77 cm AV Vmax:        120.00 cm/s AV Peak Grad:   5.8 mmHg LVOT Vmax:      83.60 cm/s LVOT Vmean:     59.200 cm/s LVOT VTI:       0.125 m  AORTA Ao Root diam: 3.00 cm Ao Asc diam:  2.80 cm MITRAL VALVE                TRICUSPID VALVE MV Area (PHT): 5.50 cm     TR Peak grad:   24.4 mmHg MV Decel Time: 138 msec     TR Vmax:        247.00 cm/s MR Peak grad: 85.0 mmHg MR Vmax:      461.00 cm/s   SHUNTS MV E velocity: 105.00 cm/s  Systemic VTI:  0.12 m                             Systemic Diam: 1.80 cm Laurance Flatten MD Electronically signed by Laurance Flatten MD Signature Date/Time: 08/27/2021/12:08:36 PM    Final    CT HEAD WO CONTRAST ( )  Result Date: 08/27/2021 CLINICAL DATA:  Mental status change with unknown cause EXAM: CT HEAD WITHOUT CONTRAST TECHNIQUE: Contiguous axial images were obtained from the base of the skull through the vertex without intravenous contrast. RADIATION DOSE REDUCTION: This exam was performed according to the departmental dose-optimization program which includes automated exposure control, adjustment of the mA and/or kV according to patient size and/or use of iterative reconstruction technique. 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 High-density along the surface of the cortex is attributed to most likely mineralization, also seen in the basal ganglia. No new area of infarct. No hematoma is suspected. Cerebral volume loss. No hydrocephalus Vascular: No hyperdense vessel  or unexpected calcification. Skull: Normal. Negative for fracture or focal lesion. Sinuses/Orbits: No acute finding. Other: Intravenous gas asymmetric to the right paravertebral plexus and temporal fossa, usually from IV access. IMPRESSION: 1. No acute finding. 2. Continued evolution of the remote  right MCA territory infarct. Electronically Signed   By: Tiburcio Pea M.D.   On: 08/27/2021 05:06   CT Angio Chest PE W and/or Wo Contrast  Result Date: 08/26/2021 CLINICAL DATA:  Concern for pulmonary embolism. EXAM: CT ANGIOGRAPHY CHEST WITH CONTRAST TECHNIQUE: Multidetector CT imaging of the chest was performed using the standard protocol during bolus administration of intravenous contrast. Multiplanar CT image reconstructions and MIPs were obtained to evaluate the vascular anatomy. RADIATION DOSE REDUCTION: This exam was performed according to the departmental dose-optimization program which includes automated exposure control, adjustment of the mA and/or kV according to patient size and/or use of iterative reconstruction technique. CONTRAST:  60mL OMNIPAQUE IOHEXOL 350 MG/ML SOLN COMPARISON:  Chest radiograph dated 08/26/2021. FINDINGS: Cardiovascular: Borderline cardiomegaly. Small pericardial effusion measuring 7 mm in thickness. There is calcification of the mitral annulus. Coronary vascular calcifications. And mild atherosclerotic calcification of the thoracic aorta. No aneurysmal dilatation. No pulmonary artery embolus identified. Mediastinum/Nodes: Right hilar adenopathy measures 9 mm short axis. The esophagus is grossly unremarkable. No mediastinal fluid collection. Lungs/Pleura: Moderate bilateral pleural effusions with partial compressive atelectasis of the lower lobes versus pneumonia. There is diffuse interstitial and interlobular septal prominence consistent with edema. Scattered clusters of ground-glass density primarily throughout the right upper lobe, likely edema. Pneumonia is not excluded.  There is no pneumothorax. The central airways are patent. Upper Abdomen: No acute abnormality. Musculoskeletal: Degenerative changes of the spine. No acute osseous pathology. Review of the MIP images confirms the above findings. IMPRESSION: 1. No CT evidence of pulmonary artery embolus. 2. Moderate bilateral pleural effusions with compressive atelectasis of the lower lobes versus pneumonia. 3. Scattered clusters of ground-glass density primarily throughout the right upper lobe, likely edema. Pneumonia is not excluded. 4. Borderline cardiomegaly with small pericardial effusion. 5. Aortic Atherosclerosis (ICD10-I70.0). Electronically Signed   By: Elgie Collard M.D.   On: 08/26/2021 21:11   DG Chest Portable 1 View  Result Date: 08/26/2021 CLINICAL DATA:  Dyspnea EXAM: PORTABLE CHEST 1 VIEW COMPARISON:  Radiographs 07/05/2021 FINDINGS: Enlarged cardiomediastinal silhouette. New hazy airspace and interstitial opacities greatest in the lung bases. New layering small-moderate left small right pleural effusions. Aortic calcification. Mitral annular calcification. IMPRESSION: 1. Findings favored to represent pulmonary edema though pneumonia is difficult to exclude. 2. Small-moderate left and probable small right pleural effusions. 3. Stable cardiomegaly. Electronically Signed   By: Minerva Fester M.D.   On: 08/26/2021 13:52    Pending Labs Unresulted Labs (From admission, onward)     Start     Ordered   08/27/21 0600  CBC  Now then every 6 hours,   R     Question:  Specimen collection method  Answer:  IV Team=IV Team collect   08/27/21 0014   08/27/21 0600  Basic metabolic panel  Now then every 6 hours,   R     Question:  Specimen collection method  Answer:  IV Team=IV Team collect   08/27/21 0014   08/26/21 2348  Pathologist smear review  Add-on,   AD       Question:  Specimen collection method  Answer:  IV Team=IV Team collect   08/26/21 2347   08/26/21 2340  Protime-INR  Once,   R        08/26/21  2340   08/26/21 2340  APTT  Once,   R        08/26/21 2340            Vitals/Pain Today's Vitals  08/27/21 1030 08/27/21 1045 08/27/21 1230 08/27/21 1300  BP:   (!) 145/94 (!) 123/99  Pulse: (!) 123 (!) 121 (!) 131 (!) 101  Resp: (!) 23 19 (!) 22 (!) 24  Temp:    98.2 F (36.8 C)  TempSrc:    Axillary  SpO2: 99% 99% 99% 99%  Weight:      Height:      PainSc:        Isolation Precautions No active isolations  Medications Medications  furosemide (LASIX) injection 40 mg (40 mg Intravenous Given 08/27/21 0605)  acetaminophen (TYLENOL) tablet 650 mg (has no administration in time range)    Or  acetaminophen (TYLENOL) suppository 650 mg (has no administration in time range)  polyethylene glycol (MIRALAX / GLYCOLAX) packet 17 g (has no administration in time range)  ondansetron (ZOFRAN) tablet 4 mg (has no administration in time range)    Or  ondansetron (ZOFRAN) injection 4 mg (has no administration in time range)  amiodarone (PACERONE) tablet 200 mg (200 mg Per Tube Given 08/27/21 1234)  atorvastatin (LIPITOR) tablet 40 mg (40 mg Per Tube Given 08/27/21 1234)  metoprolol tartrate (LOPRESSOR) injection 2.5 mg (2.5 mg Intravenous Given 08/27/21 1240)  ipratropium-albuterol (DUONEB) 0.5-2.5 (3) MG/3ML nebulizer solution 3 mL (has no administration in time range)  ipratropium-albuterol (DUONEB) 0.5-2.5 (3) MG/3ML nebulizer solution 3 mL (3 mLs Nebulization Given 08/27/21 0815)  albuterol (PROVENTIL) (2.5 MG/3ML) 0.083% nebulizer solution (10 mg/hr Nebulization Given 08/26/21 1429)  ipratropium (ATROVENT) nebulizer solution 0.5 mg (0.5 mg Nebulization Given 08/26/21 1429)  iohexol (OMNIPAQUE) 350 MG/ML injection 60 mL (60 mLs Intravenous Contrast Given 08/26/21 2050)  metoprolol tartrate (LOPRESSOR) injection 2.5 mg (2.5 mg Intravenous Given 08/26/21 2230)  0.9 %  sodium chloride infusion (Manually program via Guardrails IV Fluids) (0 mLs Intravenous Stopped 08/27/21 1334)  furosemide  (LASIX) injection 40 mg (40 mg Intravenous Given 08/27/21 0240)    Mobility Non ambulatory  High Fall Risk   Focused Assessments Cardiac Assessment Handoff:    Lab Results  Component Value Date   CKTOTAL 4,387 (H) 07/04/2021   CKMB 1.2 04/04/2010   TROPONINI 0.03        NO INDICATION OF MYOCARDIAL INJURY. 04/04/2010   No results found for: "DDIMER" Does the Patient currently have chest pain? No    R Recommendations: See Admitting Provider Note  Report given to:   Additional Notes:

## 2021-08-27 NOTE — Assessment & Plan Note (Signed)
   Patient exhibiting substantial left upper extremity weakness in addition to her bilateral lower extremity edema  This is more likely dependent edema due to patient's dense left-sided hemiparesis  We will obtain an ultrasound of the left upper extremity to ensure there is no evidence of DVT although this is unlikely.

## 2021-08-27 NOTE — H&P (Signed)
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  Of note, patient was hospitalized at [facility] on the neurology service from 6/25 until 6/2.  Patient initially presented with confusion, left-sided facial droop, slurred speech and dense left-sided weakness.  Patient was found to have an acute stroke in the right basal ganglia with hemorrhagic transformation.  Patient is also noted to be in atrial fibrillation with rapid ventricular spots.  Patient initially managed with diltiazem infusion and later switched amiodarone and low-dose metoprolol.  Due to secondary dysphagia a PEG tube was placed.  Patient clinically improved and patient was eventually discharged [facility] on 6/2 with the patient being discharged on aspirin 81 mg daily with the addition of 2.5 mg of Eliquis twice daily on 6/7.  Patient is a poor historian due to severe lethargy.  Majority the history has been obtained from discussions with the emergency department staff, review of EMS notes and review of ED notes.   Patient has been experiencing increasing shortness of breath over the past several days.    As symptoms continue to worsen patient also developed associated bilateral lower extremity edema followed by edema.   Patient has not exhibited any concurrent fever, chest pain, sick  contacts or contacts with confirmed COVID-19 infection.  Due to progressively worsening symptoms EMS was contacted who promptly came to evaluate the patient and brought her into [facility] emergency department for evaluation.    Upon evaluation in the emergency department patient was found to be in rapid atrial fibrillation and in respiratory distress.  Patient was placed on BiPAP therapy for work of breathing.  Due to concerns for volume overload patient was additionally administered 40 mg of intravenous Lasix.  The hospitalist group was then called to assess the patient for admission to the hospital.  Review of Systems: Review of Systems  Unable to perform ROS: Mental status change     Past Medical History:  Diagnosis Date   A-fib [facility])    Atrial fibrillation (HCC)    Hypertension    Loss of consciousness (HCC)    Syncope     Past Surgical History:  Procedure Laterality Date   BARTHOLIN GLAND CYST EXCISION     ESOPHAGOGASTRODUODENOSCOPY (EGD) WITH PROPOFOL N/A 07/08/2021   Procedure: ESOPHAGOGASTRODUODENOSCOPY (EGD) WITH PROPOFOL;  Surgeon: Violeta Gelinas, MD;  Location: [facility] ENDOSCOPY;  Service: General;  Laterality: N/A;   PEG PLACEMENT N/A 07/08/2021   Procedure: PERCUTANEOUS ENDOSCOPIC GASTROSTOMY (PEG) PLACEMENT;  Surgeon: Violeta Gelinas, MD;  Location: [facility] ENDOSCOPY;  Service: General;  Laterality: N/A;    Social History:  reports that she has never smoked. She has never used smokeless tobacco. She reports current alcohol use. She reports that she does not use drugs.  No Known Allergies  Family History  Problem Relation Age of Onset   Parkinson's disease Mother    Lung cancer Father    Alzheimer's disease Sister  Cirrhosis Brother    Diabetes Sister    Arthritis Sister     Prior to Admission medications   Medication Sig Start Date End Date Taking? Authorizing Provider  amiodarone (PACERONE) 200 MG tablet Place 1 tablet (200 mg total) into feeding tube 2  (two) times daily. 07/11/21   Elmer Picker, NP  apixaban (ELIQUIS) 2.5 MG TABS tablet Take 1 tablet (2.5 mg total) by mouth 2 (two) times daily. 07/16/21   Elmer Picker, NP  aspirin 81 MG chewable tablet Place 1 tablet (81 mg total) into feeding tube daily. 07/12/21   Elmer Picker, NP  atorvastatin (LIPITOR) 40 MG tablet Place 1 tablet (40 mg total) into feeding tube daily. 07/12/21   Elmer Picker, NP  Cholecalciferol (VITAMIN D3) 50000 units CAPS Take 50,000 Units by mouth every Sunday.    [provider]  metoprolol tartrate (LOPRESSOR) 25 mg/10 mL SUSP Place 5 mLs (12.5 mg total) into feeding tube every 8 (eight) hours. 07/11/21   Elmer Picker, NP    Physical Exam:  Vitals:   08/26/21 2330 08/27/21 0030 08/27/21 0055 08/27/21 0100  BP: 99/67 119/67 128/74 (!) 131/96  Pulse: (!) 113 (!) 113 (!) 112 (!) 120  Resp: 17 (!) 35 18 19  Temp:   98.4 F (36.9 C)   TempSrc:   Oral   SpO2: 99% 99% 99% 99%  Weight:      Height:        Constitutional: Lethargic but arousable, minimally verbal, somewhat tachypneic, somewhat tachypneic no associated distress.   Skin: Large ecchymosis with central area of bleeding over the left forearm.  Please see picture under media tab.  Markedly thin skin without other ulcerations noted.  Good skin turgor. Eyes: Pupils are equally reactive to light.  No evidence of scleral icterus or conjunctival pallor.  ENMT: Moist mucous membranes noted.  Not participating in exam well enough to observe posterior oropharynx. Neck: normal, supple, no masses, no thyromegaly.  No evidence of jugular venous distension.   Respiratory: Notable rales in the mid and lower fields bilaterally with diminished breath sounds at the bases.  Increased respiratory effort without accessory muscle use.  Cardiovascular: Regular rate and rhythm, no murmurs / rubs / gallops.  Substantial pitting edema of the bilateral lower extremities from the feet all the way through the thighs.  Additional  edema of the left upper extremity in isolation.  2+ pedal pulses. No carotid bruits.  Chest:   Nontender without crepitus or deformity.   Back:   Nontender without crepitus or deformity. Abdomen: Abdomen is protuberant fusilli tender but soft.  No evidence of intra-abdominal masses.  Positive bowel sounds noted in all quadrants.   Musculoskeletal: No joint deformity upper and lower extremities. Good ROM, no contractures. Normal muscle tone.  Neurologic: Patient is markedly lethargic, minimally arousable and minimally verbal.  Patient is only intermittently following commands.  Patient is a dense left-sided hemiparesis.  Psychiatric: Unable to assess due to substantial lethargy.  Patient currently does not seem to possess insight as to her current situation.  Data Reviewed:  I have personally reviewed and interpreted labs, imaging.  Significant findings are:  Chemistry revealing sodium 119, potassium 4.8, chloride 84, bicarbonate 21, BUN 37, creatinine 0.81. CBC revealing white blood cell count of 13.5, hemoglobin 7.7, hematocrit 24, platelet count of 272. BNP 437.4 Lactic acid 2.1 Troponin 22 and 24 CT angiogram of the chest revealing moderate bilateral pleural effusions with compressive atelectasis initially groundglass infiltrates concerning for pulmonary edema.  Chest x-ray personally reviewed revealing pulmonary edema and bilateral pleural effusions.  EKG: Personally reviewed.  Rhythm is atrial fibrillation with heart rate of 129 bpm.  No dynamic ST segment changes appreciated.   Assessment and Plan: * Atrial fibrillation with rapid ventricular response [facility]) Patient presenting with atrial fibrillation with rapid ventricular response Patient initiated on intravenous AV nodal blocking agents including Amiodarone via PEG BID as well as Metoprolol 2.5mg  IV Q6hrs Holding anticoagulation at this time due to concerns for change in mental status until intracranial hemorrhage can be ruled out  with repeat CT imaging Obtaining echocardiogram in the morning Monitoring patient on telemetry Obtaining cardiology consultation the morning Furthermore, obtaining magnesium, TSH, urine drug screen, troponin and D-dimer to evaluate for possible etiologies    Acute on chronic diastolic CHF (congestive heart failure) (HCC) Concerns for acute cardiogenic volume overload with evidence of pulmonary edema, bilateral pleural effusions and substantial peripheral edema BiPAP has been initiated by the emergency department staff for work of breathing but will try to transition this over to nasal cannula or other forms of oxygen delivery as patient is extremely weak right now and I am uncertain as to whether she would be able to pull the BiPAP mask off if needed Echocardiogram done in May did not reveal any definitive evidence of systolic or diastolic dysfunction but we will repeat this as patient's presentation is quite dramatic Volume overload may simply be due to 6 weeks of uncontrolled rapid atrial fibrillation Patient is been initiated on 40 mg of intravenous Lasix twice daily by the emergency room established will be continued at this time Monitoring renal function and electrolytes with serial chemistires Daily weights Monitoring patient on telemetry Echocardiogram ordered for the morning    Acute metabolic encephalopathy Patient presenting with substantial lethargy.  Patient is minimally responsive and intermittently following commands This is a dramatic departure from patient's discharge physical examination findings in early June Possibly multifactorial due to substantial hyponatremia and hypoxia Obtaining stat CT imaging of the head without contrast to ensure patient is not exhibiting any evidence of recurrent bleed considering patient has been on Eliquis and aspirin for the past several weeks Serial VBG's revealed no evidence of substantial hypercapnia Monitoring closely for clinical  improvement as sodium levels correct  Hyponatremia Patient presenting with moderate to severe hyponatremia  Clinically patient appears volume overloaded with substantial bilateral lower extremity pitting edema that tracks up to the thighs Concerned that cardiogenic volume overload is the cause Obtaining serum osmolality, urine osmolality, urine sodium Diuresing with intravenous Lasix Serial chemistries to ensure slow measured correction of sodium levels    Normocytic anemia With drop in hemoglobin while here, now at 6.6 No clinical evidence of bleeding Obtaining hepatic function panel, peripheral smear, coagulation profile, type and screen Plan to transfuse 1 unit packed red blood cells and continue to monitor evidence of bleeding Holding all anticoagulation for now SCDs for DVT prophylaxis CBCs every 6 hours Consider CT angiogram of the abdomen and pelvis if rate of bleeding worsens and no source is apparent Consider GI consultation in the morning if hemoglobin continues to drop  Lactic acidosis Lactic acidosis likely secondary to transient hypoxia in the setting of pulm edema and bilateral pleural effusions Treating underlying condition with intermittent BiPAP, supplemental oxygen and intravenous diuretics Monitoring serial lactic acid levels to ensure downtrending and resolution.   Elevated troponin level not due myocardial infarction Slightly elevated serial troponins with flat trajectory of elevation Likely secondary to underlying illness, plaque rupture  is unlikely Monitoring patient on telemetry   Bilateral pleural effusion Please see assessment and plan above  Acute cardiogenic pulmonary edema (HCC) See assessment and plan above   Edema of left upper extremity Patient exhibiting substantial left upper extremity weakness in addition to her bilateral lower extremity edema This is more likely dependent edema due to patient's dense left-sided hemiparesis We will  obtain an ultrasound of the left upper extremity to ensure there is no evidence of DVT although this is unlikely.  Mixed hyperlipidemia Continue statin via PEG tube       Code Status:  Full code  Family Communication: Attempted to contact the niece via the listed phone number in the facesheet unsuccessfully  Consults: None, will consider GI consult in AM based on serial CBC's  Severity of Illness:  The appropriate patient status for this patient is INPATIENT. Inpatient status is judged to be reasonable and necessary in order to provide the required intensity of service to ensure the patient's safety. The patient's presenting symptoms, physical exam findings, and initial radiographic and laboratory data in the context of their chronic comorbidities is felt to place them at high risk for further clinical deterioration. Furthermore, it is not anticipated that the patient will be medically stable for discharge from the hospital within 2 midnights of admission.   * I certify that at the point of admission it is my clinical judgment that the patient will require inpatient hospital care spanning beyond 2 midnights from the point of admission due to high intensity of service, high risk for further deterioration and high frequency of surveillance required.*  CRITICAL CARE ATTESTATION  Patient is at significant risk of morbidity mortality due to acute cardiogenic pulmonary edema with rapid atrial fibrillation evidence of bilateral pleural effusions and substantial cardiogenic pulmonary edema complicated by presentation with acute metabolic encephalopathy, lactic acidosis and acute blood loss anemia.  Efforts have been made to interpret multiple rounds of blood gases and other laboratory values, titration of BiPAP therapy, initiating blood transfusions, titration of AV nodal blocking agents and rapidly ordering a multitude of diagnostic tests to identify and intervene on the patient's substantial  illness.  Critical care time spent 70 minutes.  Author:  Marinda Elk MD  08/27/2021 1:14 AM

## 2021-08-27 NOTE — Assessment & Plan Note (Addendum)
Continue with statin therapy.  ?

## 2021-08-27 NOTE — Consult Note (Addendum)
Cardiology Consultation:   Patient ID: Gwendolyn [name] MRN: [id number]; DOB: [birth year]  Admit date: 08/26/2021 Date of Consult: 08/27/2021  PCP:  Irena Reichmann, DO   [facility] Providers Cardiologist:  Chrystie Nose, MD        Patient Profile:   Gwendolyn [name] is a 86 y.o. female with a hx of atrial fib 2019, with no follow-up, hx of syncope and last seen in 06/2021 for A fib after admit with AMS and multiple acute hemorrhages in rt basal ganglia who is being seen 08/27/2021 for the evaluation of a fib with RVR with admit with acute CHF at the request of Dr. Leafy Half.  History of Present Illness:   [name] with hx of a fib in 2019 with stopping of eliquis for maybe falls and syncope.  She did not return to office.  In 04/2021 she had syncope at bank with rt posterior parietal scalp hematoma no intracranial traumatic finding.  Then I'm May 2023 she was admitted after EMS brought in with confusion, lt facial droop and slurred speech.  CT head with Acute intraparenchymal hemorrhages within the right basal ganglia, she developed a fib with RVR and placed on amiodarone and dilt.  Then dilt changed to BB.   At discharge went home on eliquis 2.5 mg BID started on 07/16/21- per neurology,  ASA 81 lopressor 12.5 every 8 hours in atrial fib with slow rate.  So lopressor at 12.5.  she still had episodes of a fib RVR as well.  Pt had been living at [employer] [address] motel due loss of home, at discharge went to [facility].     Now admitted today after having SOB over several days. Also with lower ext edema.  No fever, chest pain.  EMS was called and brought to [facility] ER.  She was in a fib wit RVR and in respiratory distress.  Placed on Bipap- given IV lasix.   EKG:  The EKG was personally reviewed and demonstrates:  a fib wit RVR HR 129 and non specific ST abnormality but no changes from 07/03/21.   Telemetry:  Telemetry was personally reviewed and demonstrates:  atrial fib with RVR at 121.  Na 123,  k+ 4.4, BUN 33 Cr 0.90 Mg+ 1.9  Hgb 8.6 WBC 10.2 plts 240  TSH 22.629  Hs troponin 22 and 24  BNP 437  CT head wo contrast IMPRESSION: 1. No acute finding. 2. Continued evolution of the remote right MCA territory infarct.  Echo today EF 50-55% no RWMA, RV systolic function mildly reduced. LA mild to mod dilated. RA mildly dilated.  Mod sized circumferential pericardial effusion no tamponade trivial MR Severe mitral annular calcification  in May on echo there was trivial pericardial effusion.   She continues on her amio per PEG, and now with IV lopressor 2.5 every 6 hours.  Now on lasix 40 mg IV every 12 hours. Neg 700 and no weights.  Eliquis held due to AMS.   BP 150/86, 123/99, 143/120 resp 21 HR now 119 Initially lethargic now awake.  Past Medical History:  Diagnosis Date   A-fib [employer])    Atrial fibrillation (HCC)    Hypertension    Loss of consciousness (HCC)    Syncope     Past Surgical History:  Procedure Laterality Date   BARTHOLIN GLAND CYST EXCISION     ESOPHAGOGASTRODUODENOSCOPY (EGD) WITH PROPOFOL N/A 07/08/2021   Procedure: ESOPHAGOGASTRODUODENOSCOPY (EGD) WITH PROPOFOL;  Surgeon: Violeta Gelinas, MD;  Location: [facility] ENDOSCOPY;  Service: General;  Laterality: N/A;   PEG PLACEMENT N/A 07/08/2021   Procedure: PERCUTANEOUS ENDOSCOPIC GASTROSTOMY (PEG) PLACEMENT;  Surgeon: Violeta Gelinas, MD;  Location: [facility] ENDOSCOPY;  Service: General;  Laterality: N/A;     Home Medications:  Prior to Admission medications   Medication Sig Start Date End Date Taking? Authorizing Provider  Amino Acids-Protein Hydrolys (FEEDING SUPPLEMENT, PRO-STAT SUGAR FREE 64,) LIQD Take 30 mLs by mouth daily in the afternoon.   Yes [provider]  amiodarone (PACERONE) 200 MG tablet Place 1 tablet (200 mg total) into feeding tube 2 (two) times daily. 07/11/21  Yes Elmer Picker, NP  apixaban (ELIQUIS) 2.5 MG TABS tablet Take 1 tablet (2.5 mg total) by mouth 2 (two) times daily. 07/16/21  Yes  Elmer Picker, NP  aspirin 81 MG chewable tablet Place 1 tablet (81 mg total) into feeding tube daily. 07/12/21  Yes Shafer, Ludger Nutting, NP  atorvastatin (LIPITOR) 40 MG tablet Place 1 tablet (40 mg total) into feeding tube daily. Patient taking differently: Place 40 mg into feeding tube at bedtime. 07/12/21  Yes Shafer, Ludger Nutting, NP  bisacodyl (DULCOLAX) 10 MG suppository Place 10 mg rectally as needed (constipation).   Yes [provider]  Cholecalciferol (DIALYVITE VITAMIN D3 MAX) 1.25 MG (50000 UT) TABS Place 50,000 Units into feeding tube once a week.   Yes [provider]  Magnesium Hydroxide (MILK OF MAGNESIA PO) Take 30 mLs by mouth as needed (If no BM in 3 days).   Yes [provider]  metoprolol tartrate (LOPRESSOR) 25 MG tablet Place 12.5 mg into feeding tube every 8 (eight) hours.   Yes [provider]  Nutritional Supplements (FEEDING SUPPLEMENT, OSMOLITE 1.5 CAL,) LIQD Place 45 mLs into feeding tube continuous.   Yes [provider]  Sodium Phosphates (RA SALINE ENEMA RE) Place 1 Dose rectally as needed (constipation not releived by dulcolax suppository).   Yes [provider]  traMADol (ULTRAM) 50 MG tablet Take 50 mg by mouth every 6 (six) hours as needed (pain).   Yes [provider]  Cholecalciferol (VITAMIN D3) 50000 units CAPS Take 50,000 Units by mouth every Sunday.    [provider]  metoprolol tartrate (LOPRESSOR) 25 mg/10 mL SUSP Place 5 mLs (12.5 mg total) into feeding tube every 8 (eight) hours. 07/11/21   Elmer Picker, NP    Inpatient Medications: Scheduled Meds:  amiodarone  200 mg Per Tube BID   atorvastatin  40 mg Per Tube Daily   furosemide  40 mg Intravenous Q12H   ipratropium-albuterol  3 mL Nebulization Q6H   metoprolol tartrate  2.5 mg Intravenous Q6H   Continuous Infusions:  PRN Meds: acetaminophen **OR** acetaminophen, ipratropium-albuterol, ondansetron **OR** ondansetron (ZOFRAN) IV,  polyethylene glycol  Allergies:   No Known Allergies  Social History:   Social History   Socioeconomic History   Marital status: Widowed    Spouse name: Not on file   Number of children: Not on file   Years of education: Not on file   Highest education level: Not on file  Occupational History   Not on file  Tobacco Use   Smoking status: Never   Smokeless tobacco: Never  Vaping Use   Vaping Use: Never used  Substance and Sexual Activity   Alcohol use: Yes    Comment: occasional   Drug use: Never   Sexual activity: Not on file  Other Topics Concern   Not on file  Social History Narrative   ** Merged History Encounter **  Social Determinants of Health   Financial Resource Strain: Not on file  Food Insecurity: Not on file  Transportation Needs: Not on file  Physical Activity: Not on file  Stress: Not on file  Social Connections: Not on file  Intimate Partner Violence: Not on file    Family History:    Family History  Problem Relation Age of Onset   Parkinson's disease Mother    Lung cancer Father    Alzheimer's disease Sister    Cirrhosis Brother    Diabetes Sister    Arthritis Sister      ROS:  Please see the history of present illness.  General:no colds or fevers, no weight changes Skin:no rashes or ulcers HEENT:no blurred vision, no congestion CV:see HPI PUL:see HPI GI:no diarrhea constipation or melena, no indigestion GU:no hematuria, no dysuria MS:no joint pain, no claudication Neuro:no syncope, no lightheadedness Endo:no diabetes, + thyroid disease new diagnosis  All other ROS reviewed and negative.     Physical Exam/Data:   Vitals:   08/27/21 1045 08/27/21 1230 08/27/21 1300 08/27/21 1400  BP:  (!) 145/94 (!) 123/99 138/86  Pulse: (!) 121 (!) 131 (!) 101 (!) 106  Resp: 19 (!) 22 (!) 24 (!) 23  Temp:   98.2 F (36.8 C) 98.6 F (37 C)  TempSrc:   Axillary   SpO2: 99% 99% 99% 98%  Weight:      Height:        Intake/Output  Summary (Last 24 hours) at 08/27/2021 1450 Last data filed at 08/27/2021 0604 Gross per 24 hour  Intake 300 ml  Output 1000 ml  Net -700 ml      08/26/2021    1:24 PM 07/09/2021    4:00 AM 07/08/2021    4:00 AM  Last 3 Weights  Weight (lbs) 135 lb 135 lb 5.8 oz 141 lb 8.6 oz  Weight (kg) 61.236 kg 61.4 kg 64.2 kg     Body mass index is 25.51 kg/m.  General:  white female, keeps her head to rt side, in no acute distress HEENT: normal Neck: no JVD sitting up Vascular: No carotid bruits; Distal pulses 2+ bilaterally Cardiac:  irreg irreg ; no murmur gallup rub  Lungs:  clear to auscultation bilaterally though diminished in bases, no wheezing, rhonchi or rales  Abd: soft, nontender, no hepatomegaly  Ext: 1-2+ edema of lower ext Musculoskeletal:  Lt arm and leg weak, does not move Lt arm but does Lt foot  Skin: warm and dry  Neuro:  alert and oriented, brief on word answers, post CVA Psych:  flat affect    Relevant CV Studies: Echo 08/27/21 IMPRESSIONS     1. Left ventricular ejection fraction, by estimation, is 50 to 55%. The  left ventricle has low normal function. The left ventricle has no regional  wall motion abnormalities. Diastolic function indeterminant due to Afib.   2. Right ventricular systolic function is mildly reduced. The right  ventricular size is normal. There is normal pulmonary artery systolic  pressure. The estimated right ventricular systolic pressure is 32.4 mmHg.   3. Left atrial size was mild to moderately dilated.   4. Right atrial size was mildly dilated.   5. There is a moderate sized, circumferential perficardial effusion  present that appears largest around the RA/RV base. The pericardial  effusion is circumferential. There is no evidence of cardiac tamponade.  There is prominent epicardial fat present.   6. The mitral valve is degenerative. Trivial mitral valve regurgitation.  Severe mitral annular calcification.   7. The aortic valve is  tricuspid. There is moderate calcification of the  aortic valve. There is moderate thickening of the aortic valve. Aortic  valve regurgitation is not visualized. Aortic valve  sclerosis/calcification is present, without any evidence  of aortic stenosis.   8. The inferior vena cava is dilated in size with >50% respiratory  variability, suggesting right atrial pressure of 8 mmHg.   Comparison(s): Compared to prior TTE, the pericardial effusion is now  moderate (previously trivial). Otherwise, there is no significant change.   FINDINGS   Left Ventricle: Left ventricular ejection fraction, by estimation, is 50  to 55%. The left ventricle has low normal function. The left ventricle has  no regional wall motion abnormalities. The left ventricular internal  cavity size was normal in size.  There is no left ventricular hypertrophy. Diastolic function indeterminant  due to Afib.   Right Ventricle: The right ventricular size is normal. No increase in  right ventricular wall thickness. Right ventricular systolic function is  mildly reduced. There is normal pulmonary artery systolic pressure. The  tricuspid regurgitant velocity is 2.47  m/s, and with an assumed right atrial pressure of 8 mmHg, the estimated  right ventricular systolic pressure is 32.4 mmHg.   Left Atrium: Left atrial size was mild to moderately dilated.   Right Atrium: Right atrial size was mildly dilated.   Pericardium: There is very prominent epicardial fat present. There is a  moderate sized, circumferential perficardial effusion present that appears  largest around the RA/RV base. The pericardial effusion is  circumferential. There is no evidence of cardiac   tamponade.   Mitral Valve: The mitral valve is degenerative in appearance. There is  mild thickening of the mitral valve leaflet(s). There is mild  calcification of the mitral valve leaflet(s). Severe mitral annular  calcification. Trivial mitral valve  regurgitation.   Tricuspid Valve: The tricuspid valve is normal in structure. Tricuspid  valve regurgitation is mild.   Aortic Valve: The aortic valve is tricuspid. There is moderate  calcification of the aortic valve. There is moderate thickening of the  aortic valve. Aortic valve regurgitation is not visualized. Aortic valve  sclerosis/calcification is present, without any   evidence of aortic stenosis. Aortic valve peak gradient measures 5.8  mmHg.   Pulmonic Valve: The pulmonic valve was normal in structure. Pulmonic valve  regurgitation is trivial.   Aorta: The aortic root and ascending aorta are structurally normal, with  no evidence of dilitation.   Venous: The inferior vena cava is dilated in size with greater than 50%  respiratory variability, suggesting right atrial pressure of 8 mmHg.   IAS/Shunts: The atrial septum is grossly normal.   Additional Comments: There is a small pleural effusion in the left lateral  region.   Echo 07/04/21 IMPRESSIONS     1. Left ventricular ejection fraction, by estimation, is 55% with beat to  beat variability. The left ventricle has normal function. The left  ventricle has no regional wall motion abnormalities. Left ventricular  diastolic parameters are indeterminate.   2. Right ventricular systolic function is mildly reduced. The right  ventricular size is normal. There is normal pulmonary artery systolic  pressure. The estimated right ventricular systolic pressure is 23.8 mmHg.   3. Left atrial size was moderately dilated.   4. Right atrial size was moderately dilated.   5. The mitral valve is degenerative. Trivial mitral valve regurgitation.  No evidence of mitral stenosis. Severe mitral annular  calcification.   6. The aortic valve is grossly normal. There is mild calcification of the  aortic valve. There is mild thickening of the aortic valve. Aortic valve  regurgitation is not visualized. Aortic valve sclerosis is present, with   no evidence of aortic valve  stenosis.   7. The inferior vena cava is normal in size with <50% respiratory  variability, suggesting right atrial pressure of 8 mmHg.   Conclusion(s)/Recommendation(s): No intracardiac source of embolism  detected on this transthoracic study. Consider a transesophageal  echocardiogram to exclude cardiac source of embolism if clinically  indicated.   FINDINGS   Left Ventricle: Anomalous chord inserting into basal septum. Left  ventricular ejection fraction, by estimation, is 55%. The left ventricle  has normal function. The left ventricle has no regional wall motion  abnormalities. The left ventricular internal  cavity size was normal in size. There is no left ventricular hypertrophy.  Left ventricular diastolic parameters are indeterminate.   Right Ventricle: The right ventricular size is normal. No increase in  right ventricular wall thickness. Right ventricular systolic function is  mildly reduced. There is normal pulmonary artery systolic pressure. The  tricuspid regurgitant velocity is 1.99  m/s, and with an assumed right atrial pressure of 8 mmHg, the estimated  right ventricular systolic pressure is 23.8 mmHg.   Left Atrium: Left atrial size was moderately dilated.   Right Atrium: Right atrial size was moderately dilated.   Pericardium: Trivial pericardial effusion is present.   Mitral Valve: The mitral valve is degenerative in appearance. Severe  mitral annular calcification. Trivial mitral valve regurgitation. No  evidence of mitral valve stenosis. The mean mitral valve gradient is 1.2  mmHg with average heart rate of 136 bpm.   Tricuspid Valve: The tricuspid valve is normal in structure. Tricuspid  valve regurgitation is mild . No evidence of tricuspid stenosis.   Aortic Valve: The aortic valve is grossly normal. There is mild  calcification of the aortic valve. There is mild thickening of the aortic  valve. Aortic valve regurgitation  is not visualized. Aortic valve  sclerosis is present, with no evidence of aortic  valve stenosis. Aortic valve mean gradient measures 2.0 mmHg. Aortic valve  peak gradient measures 3.8 mmHg. Aortic valve area, by VTI measures 1.64  cm.   Pulmonic Valve: The pulmonic valve was normal in structure. Pulmonic valve  regurgitation is trivial. No evidence of pulmonic stenosis.   Aorta: The aortic root is normal in size and structure.   Venous: The inferior vena cava is normal in size with less than 50%  respiratory variability, suggesting right atrial pressure of 8 mmHg.   IAS/Shunts: No atrial level shunt detected by color flow Doppler.  Laboratory Data:  High Sensitivity Troponin:   Recent Labs  Lab 08/26/21 1428 08/26/21 1700  TROPONINIHS 24* 22*     Chemistry Recent Labs  Lab 08/26/21 2327 08/27/21 0405 08/27/21 1240  NA 120* 123* 123*  K 4.8 4.8 4.4  CL 85* 83* 86*  CO2 23 24 22   GLUCOSE 111* 117* 98  BUN 35* 33* 30*  CREATININE 0.94 0.99 0.90  CALCIUM 8.0* 8.5* 8.1*  MG  --  1.9  --   GFRNONAA >60 57* >60  ANIONGAP 12 16* 15    Recent Labs  Lab 08/26/21 1428  PROT 7.0  ALBUMIN 2.7*  AST 86*  ALT 134*  ALKPHOS 220*  BILITOT 0.7   Lipids No results for input(s): "CHOL", "TRIG", "HDL", "LABVLDL", "LDLCALC", "CHOLHDL" in  the last 168 hours.  Hematology Recent Labs  Lab 08/26/21 2327 08/27/21 0405 08/27/21 1240  WBC 9.7 10.1 10.2  RBC 2.13* 2.81* 2.85*  HGB 6.6* 8.6* 8.6*  HCT 19.6* 25.7* 25.6*  MCV 92.0 91.5 89.8  MCH 31.0 30.6 30.2  MCHC 33.7 33.5 33.6  RDW 16.0* 16.2* 16.7*  PLT 237 249 240   Thyroid  Recent Labs  Lab 08/26/21 2325  TSH 22.629*    BNP Recent Labs  Lab 08/26/21 1428  BNP 437.4*    DDimer No results for input(s): "DDIMER" in the last 168 hours.   Radiology/Studies:  VAS [place] UPPER EXTREMITY VENOUS DUPLEX  Result Date: 08/27/2021 UPPER VENOUS STUDY  Patient Name:  Gwendolyn [name]  Date of Exam:   08/27/2021 Medical Rec  #: [id number]         Accession #:    [id number] Date of Birth: 10-30-1935        Patient Gender: F Patient Age:   70 years Exam Location:  [facility] Procedure:      VAS [place] UPPER EXTREMITY VENOUS DUPLEX Referring Phys: Shauna Hugh --------------------------------------------------------------------------------  Indications: "DVT" Comparison Study: No previous exam noted. Performing Technologist: Magdalene River BS, RVT  Examination Guidelines: A complete evaluation includes B-mode imaging, spectral Doppler, color Doppler, and power Doppler as needed of all accessible portions of each vessel. Bilateral testing is considered an integral part of a complete examination. Limited examinations for reoccurring indications may be performed as noted.  Right Findings: +----------+------------+---------+-----------+----------+-------+ RIGHT     CompressiblePhasicitySpontaneousPropertiesSummary +----------+------------+---------+-----------+----------+-------+ Subclavian    Full       Yes       Yes                      +----------+------------+---------+-----------+----------+-------+  Left Findings: +----------+------------+---------+-----------+----------+-------+ LEFT      CompressiblePhasicitySpontaneousPropertiesSummary +----------+------------+---------+-----------+----------+-------+ IJV           Full       Yes       Yes                      +----------+------------+---------+-----------+----------+-------+ Subclavian    Full       Yes       Yes                      +----------+------------+---------+-----------+----------+-------+ Axillary      Full       Yes       Yes                      +----------+------------+---------+-----------+----------+-------+ Brachial      Full                                          +----------+------------+---------+-----------+----------+-------+ Radial        Full                                           +----------+------------+---------+-----------+----------+-------+ Ulnar         Full                                          +----------+------------+---------+-----------+----------+-------+  Cephalic      Full                                          +----------+------------+---------+-----------+----------+-------+ Basilic     Partial      Yes       Yes                      +----------+------------+---------+-----------+----------+-------+ The left basilic vein at the mid upper arm is partially compressible with no obstruction of flow throughout.  Summary:  Right: No evidence of thrombosis in the subclavian.  Left: No evidence of deep vein thrombosis in the upper extremity. Findings consistent with age indeterminate superficial vein thrombosis involving the left basilic vein. Wall thickening noted in the basilic at the mid and distal upper arm.  *See table(s) above for measurements and observations.  Diagnosing physician: Sherald Hess MD Electronically signed by Sherald Hess MD on 08/27/2021 at 1:01:53 PM.    Final    ECHOCARDIOGRAM COMPLETE  Result Date: 08/27/2021    ECHOCARDIOGRAM REPORT   Patient Name:   Gwendolyn [name] Date of Exam: 08/27/2021 Medical Rec #:  [id number]        Height:       61.0 in Accession #:    [id number]       Weight:       135.0 lb Date of Birth:  October 14, 1935       BSA:          1.598 m Patient Age:    85 years         BP:           131/83 mmHg Patient Gender: F                HR:           113 bpm. Exam Location:  Inpatient Procedure: 2D Echo, Cardiac Doppler and Color Doppler Indications:    CHF  History:        Patient has prior history of Echocardiogram examinations.                 Arrythmias:Atrial Fibrillation; Risk Factors:Hypertension.  Sonographer:    Cleatis Polka Referring Phys: [id number] Deno Lunger SHALHOUB IMPRESSIONS  1. Left ventricular ejection fraction, by estimation, is 50 to 55%. The left ventricle has low normal function. The left  ventricle has no regional wall motion abnormalities. Diastolic function indeterminant due to Afib.  2. Right ventricular systolic function is mildly reduced. The right ventricular size is normal. There is normal pulmonary artery systolic pressure. The estimated right ventricular systolic pressure is 32.4 mmHg.  3. Left atrial size was mild to moderately dilated.  4. Right atrial size was mildly dilated.  5. There is a moderate sized, circumferential perficardial effusion present that appears largest around the RA/RV base. The pericardial effusion is circumferential. There is no evidence of cardiac tamponade. There is prominent epicardial fat present.  6. The mitral valve is degenerative. Trivial mitral valve regurgitation. Severe mitral annular calcification.  7. The aortic valve is tricuspid. There is moderate calcification of the aortic valve. There is moderate thickening of the aortic valve. Aortic valve regurgitation is not visualized. Aortic valve sclerosis/calcification is present, without any evidence of aortic stenosis.  8. The inferior vena cava is dilated in size with >50% respiratory variability, suggesting right atrial pressure of 8 mmHg. Comparison(s):  Compared to prior TTE, the pericardial effusion is now moderate (previously trivial). Otherwise, there is no significant change. FINDINGS  Left Ventricle: Left ventricular ejection fraction, by estimation, is 50 to 55%. The left ventricle has low normal function. The left ventricle has no regional wall motion abnormalities. The left ventricular internal cavity size was normal in size. There is no left ventricular hypertrophy. Diastolic function indeterminant due to Afib. Right Ventricle: The right ventricular size is normal. No increase in right ventricular wall thickness. Right ventricular systolic function is mildly reduced. There is normal pulmonary artery systolic pressure. The tricuspid regurgitant velocity is 2.47 m/s, and with an assumed right  atrial pressure of 8 mmHg, the estimated right ventricular systolic pressure is 32.4 mmHg. Left Atrium: Left atrial size was mild to moderately dilated. Right Atrium: Right atrial size was mildly dilated. Pericardium: There is very prominent epicardial fat present. There is a moderate sized, circumferential perficardial effusion present that appears largest around the RA/RV base. The pericardial effusion is circumferential. There is no evidence of cardiac  tamponade. Mitral Valve: The mitral valve is degenerative in appearance. There is mild thickening of the mitral valve leaflet(s). There is mild calcification of the mitral valve leaflet(s). Severe mitral annular calcification. Trivial mitral valve regurgitation. Tricuspid Valve: The tricuspid valve is normal in structure. Tricuspid valve regurgitation is mild. Aortic Valve: The aortic valve is tricuspid. There is moderate calcification of the aortic valve. There is moderate thickening of the aortic valve. Aortic valve regurgitation is not visualized. Aortic valve sclerosis/calcification is present, without any  evidence of aortic stenosis. Aortic valve peak gradient measures 5.8 mmHg. Pulmonic Valve: The pulmonic valve was normal in structure. Pulmonic valve regurgitation is trivial. Aorta: The aortic root and ascending aorta are structurally normal, with no evidence of dilitation. Venous: The inferior vena cava is dilated in size with greater than 50% respiratory variability, suggesting right atrial pressure of 8 mmHg. IAS/Shunts: The atrial septum is grossly normal. Additional Comments: There is a small pleural effusion in the left lateral region.  LEFT VENTRICLE PLAX 2D LVIDd:         3.30 cm     Diastology LVIDs:         2.50 cm     LV e' medial:    7.07 cm/s LV PW:         1.20 cm     LV E/e' medial:  14.9 LV IVS:        1.00 cm     LV e' lateral:   7.72 cm/s LVOT diam:     1.80 cm     LV E/e' lateral: 13.6 LV SV:         32 LV SV Index:   20 LVOT Area:      2.54 cm  LV Volumes (MOD) LV vol d, MOD A2C: 47.3 ml LV vol d, MOD A4C: 40.7 ml LV vol s, MOD A2C: 20.8 ml LV vol s, MOD A4C: 17.7 ml LV SV MOD A2C:     26.5 ml LV SV MOD A4C:     40.7 ml LV SV MOD BP:      24.9 ml RIGHT VENTRICLE            IVC RV Basal diam:  2.80 cm    IVC diam: 2.00 cm RV Mid diam:    1.90 cm RV S prime:     9.25 cm/s TAPSE (M-mode): 1.2 cm LEFT ATRIUM  Index        RIGHT ATRIUM           Index LA diam:        3.70 cm 2.32 cm/m   RA Area:     15.50 cm LA Vol (A2C):   48.6 ml 30.41 ml/m  RA Volume:   36.20 ml  22.65 ml/m LA Vol (A4C):   49.8 ml 31.16 ml/m LA Biplane Vol: 50.6 ml 31.66 ml/m  AORTIC VALVE AV Area (Vmax): 1.77 cm AV Vmax:        120.00 cm/s AV Peak Grad:   5.8 mmHg LVOT Vmax:      83.60 cm/s LVOT Vmean:     59.200 cm/s LVOT VTI:       0.125 m  AORTA Ao Root diam: 3.00 cm Ao Asc diam:  2.80 cm MITRAL VALVE                TRICUSPID VALVE MV Area (PHT): 5.50 cm     TR Peak grad:   24.4 mmHg MV Decel Time: 138 msec     TR Vmax:        247.00 cm/s MR Peak grad: 85.0 mmHg MR Vmax:      461.00 cm/s   SHUNTS MV E velocity: 105.00 cm/s  Systemic VTI:  0.12 m                             Systemic Diam: 1.80 cm Laurance Flatten MD Electronically signed by Laurance Flatten MD Signature Date/Time: 08/27/2021/12:08:36 PM    Final    CT HEAD WO CONTRAST ( )  Result Date: 08/27/2021 CLINICAL DATA:  Mental status change with unknown cause EXAM: CT HEAD WITHOUT CONTRAST TECHNIQUE: Contiguous axial images were obtained from the base of the skull through the vertex without intravenous contrast. RADIATION DOSE REDUCTION: This exam was performed according to the departmental dose-optimization program which includes automated exposure control, adjustment of the mA and/or kV according to patient size and/or use of iterative reconstruction technique. 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 High-density along  the surface of the cortex is attributed to most likely mineralization, also seen in the basal ganglia. No new area of infarct. No hematoma is suspected. Cerebral volume loss. No hydrocephalus Vascular: No hyperdense vessel or unexpected calcification. Skull: Normal. Negative for fracture or focal lesion. Sinuses/Orbits: No acute finding. Other: Intravenous gas asymmetric to the right paravertebral plexus and temporal fossa, usually from IV access. IMPRESSION: 1. No acute finding. 2. Continued evolution of the remote right MCA territory infarct. Electronically Signed   By: Tiburcio Pea M.D.   On: 08/27/2021 05:06   CT Angio Chest PE W and/or Wo Contrast  Result Date: 08/26/2021 CLINICAL DATA:  Concern for pulmonary embolism. EXAM: CT ANGIOGRAPHY CHEST WITH CONTRAST TECHNIQUE: Multidetector CT imaging of the chest was performed using the standard protocol during bolus administration of intravenous contrast. Multiplanar CT image reconstructions and MIPs were obtained to evaluate the vascular anatomy. RADIATION DOSE REDUCTION: This exam was performed according to the departmental dose-optimization program which includes automated exposure control, adjustment of the mA and/or kV according to patient size and/or use of iterative reconstruction technique. CONTRAST:  60mL OMNIPAQUE IOHEXOL 350 MG/ML SOLN COMPARISON:  Chest radiograph dated 08/26/2021. FINDINGS: Cardiovascular: Borderline cardiomegaly. Small pericardial effusion measuring 7 mm in thickness. There is calcification of the mitral annulus. Coronary vascular calcifications. And mild atherosclerotic  calcification of the thoracic aorta. No aneurysmal dilatation. No pulmonary artery embolus identified. Mediastinum/Nodes: Right hilar adenopathy measures 9 mm short axis. The esophagus is grossly unremarkable. No mediastinal fluid collection. Lungs/Pleura: Moderate bilateral pleural effusions with partial compressive atelectasis of the lower lobes versus  pneumonia. There is diffuse interstitial and interlobular septal prominence consistent with edema. Scattered clusters of ground-glass density primarily throughout the right upper lobe, likely edema. Pneumonia is not excluded. There is no pneumothorax. The central airways are patent. Upper Abdomen: No acute abnormality. Musculoskeletal: Degenerative changes of the spine. No acute osseous pathology. Review of the MIP images confirms the above findings. IMPRESSION: 1. No CT evidence of pulmonary artery embolus. 2. Moderate bilateral pleural effusions with compressive atelectasis of the lower lobes versus pneumonia. 3. Scattered clusters of ground-glass density primarily throughout the right upper lobe, likely edema. Pneumonia is not excluded. 4. Borderline cardiomegaly with small pericardial effusion. 5. Aortic Atherosclerosis (ICD10-I70.0). Electronically Signed   By: Elgie Collard M.D.   On: 08/26/2021 21:11   DG Chest Portable 1 View  Result Date: 08/26/2021 CLINICAL DATA:  Dyspnea EXAM: PORTABLE CHEST 1 VIEW COMPARISON:  Radiographs 07/05/2021 FINDINGS: Enlarged cardiomediastinal silhouette. New hazy airspace and interstitial opacities greatest in the lung bases. New layering small-moderate left small right pleural effusions. Aortic calcification. Mitral annular calcification. IMPRESSION: 1. Findings favored to represent pulmonary edema though pneumonia is difficult to exclude. 2. Small-moderate left and probable small right pleural effusions. 3. Stable cardiomegaly. Electronically Signed   By: Minerva Fester M.D.   On: 08/26/2021 13:52     Assessment and Plan:   Acute diastolic HF. With hypoxia, Respiratory failure On lasix 40 mg IV BID continue lasix for another 24 hours.   Atrial fib wit RVR - at discharge rate was up and down on amiodarone po 200 BID since May. Will change to IV amiodarone for better control--her TSH is elevated as well and could be playing a role per IM.  3. Anticoagulation on  eliquis since 07/16/21 now on hold. With altered mental status and acute anemia.     4.  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 Hx of falls but now living at [facility].    6. Acute metabolic encephalopathy per IM   7. Hyponatremia per IM   8. Acute anemia with hgb on arrival at 6.6 and has been transfused.  1 unit PRBCs.  Eliquis held per IM  9. Lactic acid elevation and blood cultures pending.   10. Hypothyroid new per IM  11. Pericardial Effusion has increased since May.   Risk Assessment/Risk Scores:        New York Heart Association (NYHA) Functional Class NYHA Class IV  CHA2DS2-VASc Score = 6   This indicates a 9.7% annual risk of stroke. The patient's score is based upon: CHF History: 0 HTN History: 1 Diabetes History: 0 Stroke History: 2 Vascular Disease History: 0 Age Score: 2 Gender Score: 1         For questions or updates, please contact [facility] Please consult [email or web address] for contact info under    Signed, Nada Boozer, NP  08/27/2021 2:50 PM  Patient seen and examined with Nada Boozer NP.  Agree as above, with the following exceptions and changes as noted below.  Patient is an 86 year old medically frail female on whom we are consulted for congestive heart failure. Gen: NAD, CV:  Irregular rhythm and tachycardic, no murmurs, Lungs: clear, Abd: soft, Extrem: Warm, well perfused, 2+ puffy diffuse edema, Neuro/Psych: Responds in one-word answers and appears to have slow cognition/comprehension. All available labs, radiology testing, previous records reviewed.  Patient is very medically frail and is receiving amiodarone for atrial fibrillation.  She also was noted to have hypothyroidism with a TSH of 22.  In light of this we may need to hold amiodarone and treat the hypothyroidism for the benefit of her atrial fibrillation,  defer to primary service for this management.  We will attempt rate control in this sedentary patient with digoxin and will administer IV twice daily to load this evening.  Can uptitrate her Lopressor by tube as she likely has both blood pressure and heart rate to benefit from this.  Given diffuse puffy appearing edema okay to continued IV diuresis.  Anticoagulation has been held in the setting of acute anemia requiring transfusion, agree.  Parke Poisson, MD 08/27/21 5:04 PM

## 2021-08-27 NOTE — ED Notes (Addendum)
Patient's family at [facility]) updated on patient's condition and plan of care by ED RN .

## 2021-08-27 NOTE — ED Notes (Signed)
Patient completed 1st unit PRBC with no adverse effect , afebrile /respirations unlabored /IV site unremarkable .

## 2021-08-27 NOTE — ED Notes (Signed)
1st unit PRBC infusing at 120 ml/hr. Afebrile , IV sites intact .

## 2021-08-27 NOTE — Assessment & Plan Note (Addendum)
Multifactorial encephalopathy in the setting of CVA with hemorrhagic conversion.   Positive paresis from recent CVA, more left sided than right.  Continue nutrition per tube feedings.  Mentation has improved and seems to be at baseline.  Patient with poor prognosis.

## 2021-08-27 NOTE — Progress Notes (Signed)
Removed patient from BIPAP and placed on 3L  nasal canula, per MD request. Vital signs stable at this time. RN at bed sided and aware.

## 2021-08-27 NOTE — Assessment & Plan Note (Addendum)
SP 1 unit PRBC transfusion Hgb has been stable with no signs of bleeding  Continue anticoagulation with apixaban.

## 2021-08-27 NOTE — Progress Notes (Signed)
PHARMACY - PHYSICIAN COMMUNICATION CRITICAL VALUE ALERT - BLOOD CULTURE IDENTIFICATION (BCID)  Gwendolyn [name] is an 86 y.o. female who presented to [facility] on 08/26/2021 with a chief complaint of atrial fibrillation  Assessment:  1 of 4 blood culture bottles growing staph species. Likely contaminant  Name of physician (or Provider) Contacted: Kennon Rounds  Current antibiotics: none  Changes to prescribed antibiotics recommended:  No antibiotics indicated  Results for orders placed or performed during the hospital encounter of 08/26/21  Blood Culture ID Panel (Reflexed) (Collected: 08/26/2021  2:42 PM)  Result Value Ref Range   Enterococcus faecalis NOT DETECTED NOT DETECTED   Enterococcus Faecium NOT DETECTED NOT DETECTED   Listeria monocytogenes NOT DETECTED NOT DETECTED   Staphylococcus species DETECTED (A) NOT DETECTED   Staphylococcus aureus (BCID) NOT DETECTED NOT DETECTED   Staphylococcus epidermidis NOT DETECTED NOT DETECTED   Staphylococcus lugdunensis NOT DETECTED NOT DETECTED   Streptococcus species NOT DETECTED NOT DETECTED   Streptococcus agalactiae NOT DETECTED NOT DETECTED   Streptococcus pneumoniae NOT DETECTED NOT DETECTED   Streptococcus pyogenes NOT DETECTED NOT DETECTED   A.calcoaceticus-baumannii NOT DETECTED NOT DETECTED   Bacteroides fragilis NOT DETECTED NOT DETECTED   Enterobacterales NOT DETECTED NOT DETECTED   Enterobacter cloacae complex NOT DETECTED NOT DETECTED   Escherichia coli NOT DETECTED NOT DETECTED   Klebsiella aerogenes NOT DETECTED NOT DETECTED   Klebsiella oxytoca NOT DETECTED NOT DETECTED   Klebsiella pneumoniae NOT DETECTED NOT DETECTED   Proteus species NOT DETECTED NOT DETECTED   Salmonella species NOT DETECTED NOT DETECTED   Serratia marcescens NOT DETECTED NOT DETECTED   Haemophilus influenzae NOT DETECTED NOT DETECTED   Neisseria meningitidis NOT DETECTED NOT DETECTED   Pseudomonas aeruginosa NOT DETECTED NOT DETECTED    Stenotrophomonas maltophilia NOT DETECTED NOT DETECTED   Candida albicans NOT DETECTED NOT DETECTED   Candida auris NOT DETECTED NOT DETECTED   Candida glabrata NOT DETECTED NOT DETECTED   Candida krusei NOT DETECTED NOT DETECTED   Candida parapsilosis NOT DETECTED NOT DETECTED   Candida tropicalis NOT DETECTED NOT DETECTED   Cryptococcus neoformans/gattii NOT DETECTED NOT DETECTED    [name] 08/27/2021  7:42 PM

## 2021-08-27 NOTE — Assessment & Plan Note (Addendum)
Hypomagnesemia.   Renal function has remained stable and tolerated diuresis well. At the time of her discharge serum Na is 132, K is 5,0 and serum bicarbonate at 34.  Cr is 0,95 and BUN 54.  Continue to hold on free water flushes for now and follow up renal function in 7 days. If hypernatremia, will recommend to resume free water flushes as tolerated.   Plan to continue furosemide and empagliflozin.

## 2021-08-27 NOTE — Assessment & Plan Note (Signed)
·   Please see assessment and plan above °

## 2021-08-28 DIAGNOSIS — I4891 Unspecified atrial fibrillation: Secondary | ICD-10-CM | POA: Diagnosis not present

## 2021-08-28 DIAGNOSIS — I5033 Acute on chronic diastolic (congestive) heart failure: Secondary | ICD-10-CM | POA: Diagnosis not present

## 2021-08-28 DIAGNOSIS — G9341 Metabolic encephalopathy: Secondary | ICD-10-CM | POA: Diagnosis not present

## 2021-08-28 DIAGNOSIS — I501 Left ventricular failure: Secondary | ICD-10-CM | POA: Diagnosis not present

## 2021-08-28 LAB — BPAM RBC
Blood Product Expiration Date: 202307252359
ISSUE DATE / TIME: 202307190043
Unit Type and Rh: 5100

## 2021-08-28 LAB — GLUCOSE, CAPILLARY
Glucose-Capillary: 77 mg/dL (ref 70–99)
Glucose-Capillary: 76 mg/dL (ref 70–99)
Glucose-Capillary: 69 mg/dL — ABNORMAL LOW (ref 70–99)

## 2021-08-28 LAB — TYPE AND SCREEN
ABO/RH(D): O POS
Antibody Screen: NEGATIVE
Unit division: 0

## 2021-08-28 LAB — CBC
HCT: 25.8 % — ABNORMAL LOW (ref 36.0–46.0)
Hemoglobin: 8.7 g/dL — ABNORMAL LOW (ref 12.0–15.0)
MCH: 30 pg (ref 26.0–34.0)
MCHC: 33.7 g/dL (ref 30.0–36.0)
MCV: 89 fL (ref 80.0–100.0)
Platelets: 252 10*3/uL (ref 150–400)
RBC: 2.9 MIL/uL — ABNORMAL LOW (ref 3.87–5.11)
RDW: 16.6 % — ABNORMAL HIGH (ref 11.5–15.5)
WBC: 10.7 10*3/uL — ABNORMAL HIGH (ref 4.0–10.5)
nRBC: 0 % (ref 0.0–0.2)

## 2021-08-28 LAB — BASIC METABOLIC PANEL
Anion gap: 13 (ref 5–15)
BUN: 29 mg/dL — ABNORMAL HIGH (ref 8–23)
CO2: 23 mmol/L (ref 22–32)
Calcium: 8.4 mg/dL — ABNORMAL LOW (ref 8.9–10.3)
Chloride: 89 mmol/L — ABNORMAL LOW (ref 98–111)
Creatinine, Ser: 0.94 mg/dL (ref 0.44–1.00)
GFR, Estimated: >60 mL/min (ref >60)
Glucose, Bld: 79 mg/dL (ref 70–99)
Potassium: 4.1 mmol/L (ref 3.5–5.1)
Sodium: 125 mmol/L — ABNORMAL LOW (ref 135–145)

## 2021-08-28 MED ORDER — OSMOLITE 1.2 CAL PO LIQD: 1000.0000 mL | ORAL | Status: DC

## 2021-08-28 MED ORDER — LEVOTHYROXINE SODIUM 100 MCG PO TABS
100.0000 ug | ORAL_TABLET | Freq: Every day | ORAL | Status: DC
  Administered 2021-08-28 – 2021-08-31 (×4): 100 ug via ORAL
  Filled 2021-08-28 (×4): qty 1

## 2021-08-28 MED ORDER — OSMOLITE 1.5 CAL PO LIQD
300.0000 mL | Freq: Three times a day (TID) | ORAL | Status: DC
  Administered 2021-08-28 – 2021-09-09 (×44): 300 mL
  Filled 2021-08-28 (×48): qty 474

## 2021-08-28 MED ORDER — IPRATROPIUM-ALBUTEROL 0.5-2.5 (3) MG/3ML IN SOLN
3.0000 mL | Freq: Two times a day (BID) | RESPIRATORY_TRACT | Status: DC
  Administered 2021-08-29 (×2): 3 mL via RESPIRATORY_TRACT
  Filled 2021-08-28 (×4): qty 3

## 2021-08-28 MED ORDER — FREE WATER
160.0000 mL | Freq: Three times a day (TID) | Status: DC
  Administered 2021-08-28 – 2021-09-04 (×25): 160 mL

## 2021-08-28 NOTE — Progress Notes (Addendum)
Progress Note  [name] Date of Encounter: 08/28/2021  [facility] Cardiologist: Chrystie Nose, MD   Subjective   Denies any CP or SOB.   Inpatient Medications    Scheduled Meds:  atorvastatin  40 mg Per Tube Daily   digoxin  0.25 mg Intravenous Once   furosemide  40 mg Intravenous Q12H   ipratropium-albuterol  3 mL Nebulization Q6H   metoprolol tartrate  5 mg Intravenous Q6H   Continuous Infusions:  PRN Meds: acetaminophen **OR** acetaminophen, ipratropium-albuterol, ondansetron **OR** ondansetron (ZOFRAN) IV, polyethylene glycol   Vital Signs    Vitals:   08/28/21 0217 08/28/21 0234 08/28/21 0239 08/28/21 0547  BP: (!) 127/95  (!) 127/95 140/71  Pulse: (!) 103  (!) 103 (!) 104  Resp: (!) 25  20 17   Temp:   98.1 F (36.7 C) 98 F (36.7 C)  TempSrc:    Oral  SpO2: 100% 100%  99%  Weight:      Height:        Intake/Output Summary (Last 24 hours) at 08/28/2021 0758 Last data filed at 08/28/2021 0618 Gross per 24 hour  Intake --  Output 700 ml  Net -700 ml      08/26/2021    1:24 PM 07/09/2021    4:00 AM 07/08/2021    4:00 AM  Last 3 Weights  Weight (lbs) 135 lb 135 lb 5.8 oz 141 lb 8.6 oz  Weight (kg) 61.236 kg 61.4 kg 64.2 kg      Telemetry    Atrial fibrillation with RVR HR 110s - Personally Reviewed  ECG    Atrial fibrillation - Personally Reviewed  Physical Exam   GEN: No acute distress.   Neck: No JVD Cardiac: irregularly irregular, no murmurs, rubs, or gallops.  Respiratory: Clear to auscultation bilaterally. Diminished breath sound in the left base.  GI: Soft, nontender, non-distended  MS: No edema; No deformity. Neuro:  Nonfocal  Psych: Normal affect   Labs    High Sensitivity Troponin:   Recent Labs  Lab 08/26/21 1428 08/26/21 1700  TROPONINIHS 24* 22*     Chemistry Recent Labs  Lab 08/26/21 1428 08/26/21 1445 08/27/21 0405 08/27/21 1240 08/27/21 2120 08/28/21 0216  NA 119*   < > 123* 123*  125* 125*  K 4.8   < > 4.8 4.4 4.0 4.1  CL 84*   < > 83* 86* 90* 89*  CO2 21*   < > 24 22 24 23   GLUCOSE 119*   < > 117* 98 84 79  BUN 37*   < > 33* 30* 28* 29*  CREATININE 0.81   < > 0.99 0.90 0.89 0.94  CALCIUM 8.3*   < > 8.5* 8.1* 8.4* 8.4*  MG  --   --  1.9  --   --   --   PROT 7.0  --   --   --   --   --   ALBUMIN 2.7*  --   --   --   --   --   AST 86*  --   --   --   --   --   ALT 134*  --   --   --   --   --   ALKPHOS 220*  --   --   --   --   --   BILITOT 0.7  --   --   --   --   --   GFRNONAA >60   < >  57* >60 >60 >60  ANIONGAP 14   < > 16* 15 11 13    < > = values in this interval not displayed.    Lipids No results for input(s): "CHOL", "TRIG", "HDL", "LABVLDL", "LDLCALC", "CHOLHDL" in the last 168 hours.  Hematology Recent Labs  Lab 08/27/21 1240 08/27/21 2120 08/28/21 0216  WBC 10.2 10.4 10.7*  RBC 2.85* 2.95* 2.90*  HGB 8.6* 8.7* 8.7*  HCT 25.6* 26.2* 25.8*  MCV 89.8 88.8 89.0  MCH 30.2 29.5 30.0  MCHC 33.6 33.2 33.7  RDW 16.7* 16.6* 16.6*  PLT 240 256 252   Thyroid  Recent Labs  Lab 08/26/21 2325  TSH 22.629*    BNP Recent Labs  Lab 08/26/21 1428  BNP 437.4*    DDimer No results for input(s): "DDIMER" in the last 168 hours.   Radiology    EEG adult  Result Date: 08/27/2021 Charlsie Quest, MD     08/27/2021  3:03 PM [name] MRN: [id number] Epilepsy Attending: Charlsie Quest Referring Physician/Provider: Marinda Elk, MD Date: 08/27/2021 Duration: 22.02 mins Patient history: 86yo f with ams. EEG to evaluate for seizure Level of alertness: Awake AEDs during EEG study: None Technical aspects: This EEG study was done with scalp electrodes positioned according to the 10-20 International system of electrode placement. Electrical activity was acquired at a sampling rate of 500Hz  and reviewed with a high frequency filter of 70Hz  and a low frequency filter of 1Hz . EEG data were recorded continuously and digitally stored.  Description: No clear posterior dominant rhythm was seen. EEG showed continuous generalized 3 to 6 Hz theta-delta slowing.  Hyperventilation and photic stimulation were not performed.   ABNORMALITY -Continuous slow, generalized IMPRESSION: This study is suggestive of moderate diffuse encephalopathy, nonspecific etiology. No seizures or epileptiform discharges were seen throughout the recording. Priyanka O Yadav   VAS [place] UPPER EXTREMITY VENOUS DUPLEX  Result Date: 08/27/2021 UPPER VENOUS STUDY  Patient Name:  Gwendolyn [name]  Date of Exam:   08/27/2021 Medical Rec #: [id number]         Accession #:    [id number] Date of Birth: 1935/12/01        Patient Gender: F Patient Age:   86 years Exam Location:  [facility] Procedure:      VAS [place] UPPER EXTREMITY VENOUS DUPLEX Referring Phys: Shauna Hugh --------------------------------------------------------------------------------  Indications: "DVT" Comparison Study: No previous exam noted. Performing Technologist: Magdalene River BS, RVT  Examination Guidelines: A complete evaluation includes B-mode imaging, spectral Doppler, color Doppler, and power Doppler as needed of all accessible portions of each vessel. Bilateral testing is considered an integral part of a complete examination. Limited examinations for reoccurring indications may be performed as noted.  Right Findings: +----------+------------+---------+-----------+----------+-------+ RIGHT     CompressiblePhasicitySpontaneousPropertiesSummary +----------+------------+---------+-----------+----------+-------+ Subclavian    Full       Yes       Yes                      +----------+------------+---------+-----------+----------+-------+  Left Findings: +----------+------------+---------+-----------+----------+-------+ LEFT      CompressiblePhasicitySpontaneousPropertiesSummary +----------+------------+---------+-----------+----------+-------+ IJV           Full       Yes       Yes                       +----------+------------+---------+-----------+----------+-------+ Subclavian    Full       Yes  Yes                      +----------+------------+---------+-----------+----------+-------+ Axillary      Full       Yes       Yes                      +----------+------------+---------+-----------+----------+-------+ Brachial      Full                                          +----------+------------+---------+-----------+----------+-------+ Radial        Full                                          +----------+------------+---------+-----------+----------+-------+ Ulnar         Full                                          +----------+------------+---------+-----------+----------+-------+ Cephalic      Full                                          +----------+------------+---------+-----------+----------+-------+ Basilic     Partial      Yes       Yes                      +----------+------------+---------+-----------+----------+-------+ The left basilic vein at the mid upper arm is partially compressible with no obstruction of flow throughout.  Summary:  Right: No evidence of thrombosis in the subclavian.  Left: No evidence of deep vein thrombosis in the upper extremity. Findings consistent with age indeterminate superficial vein thrombosis involving the left basilic vein. Wall thickening noted in the basilic at the mid and distal upper arm.  *See table(s) above for measurements and observations.  Diagnosing physician: Sherald Hess MD Electronically signed by Sherald Hess MD on 08/27/2021 at 1:01:53 PM.    Final    ECHOCARDIOGRAM COMPLETE  Result Date: 08/27/2021    ECHOCARDIOGRAM REPORT   Patient Name:   Gwendolyn [name] Date of Exam: 08/27/2021 Medical Rec #:  [id number]        Height:       61.0 in Accession #:    [id number]       Weight:       135.0 lb Date of Birth:  1935/12/18       BSA:          1.598 m Patient Age:    85  years         BP:           131/83 mmHg Patient Gender: F                HR:           113 bpm. Exam Location:  Inpatient Procedure: 2D Echo, Cardiac Doppler and Color Doppler Indications:    CHF  History:        Patient has prior history of Echocardiogram examinations.  Arrythmias:Atrial Fibrillation; Risk Factors:Hypertension.  Sonographer:    Cleatis Polka Referring Phys: [id number] Deno Lunger [place] IMPRESSIONS  1. Left ventricular ejection fraction, by estimation, is 50 to 55%. The left ventricle has low normal function. The left ventricle has no regional wall motion abnormalities. Diastolic function indeterminant due to Afib.  2. Right ventricular systolic function is mildly reduced. The right ventricular size is normal. There is normal pulmonary artery systolic pressure. The estimated right ventricular systolic pressure is 32.4 mmHg.  3. Left atrial size was mild to moderately dilated.  4. Right atrial size was mildly dilated.  5. There is a moderate sized, circumferential perficardial effusion present that appears largest around the RA/RV base. The pericardial effusion is circumferential. There is no evidence of cardiac tamponade. There is prominent epicardial fat present.  6. The mitral valve is degenerative. Trivial mitral valve regurgitation. Severe mitral annular calcification.  7. The aortic valve is tricuspid. There is moderate calcification of the aortic valve. There is moderate thickening of the aortic valve. Aortic valve regurgitation is not visualized. Aortic valve sclerosis/calcification is present, without any evidence of aortic stenosis.  8. The inferior vena cava is dilated in size with >50% respiratory variability, suggesting right atrial pressure of 8 mmHg. Comparison(s): Compared to prior TTE, the pericardial effusion is now moderate (previously trivial). Otherwise, there is no significant change. FINDINGS  Left Ventricle: Left ventricular ejection fraction, by estimation, is 50  to 55%. The left ventricle has low normal function. The left ventricle has no regional wall motion abnormalities. The left ventricular internal cavity size was normal in size. There is no left ventricular hypertrophy. Diastolic function indeterminant due to Afib. Right Ventricle: The right ventricular size is normal. No increase in right ventricular wall thickness. Right ventricular systolic function is mildly reduced. There is normal pulmonary artery systolic pressure. The tricuspid regurgitant velocity is 2.47 m/s, and with an assumed right atrial pressure of 8 mmHg, the estimated right ventricular systolic pressure is 32.4 mmHg. Left Atrium: Left atrial size was mild to moderately dilated. Right Atrium: Right atrial size was mildly dilated. Pericardium: There is very prominent epicardial fat present. There is a moderate sized, circumferential perficardial effusion present that appears largest around the RA/RV base. The pericardial effusion is circumferential. There is no evidence of cardiac  tamponade. Mitral Valve: The mitral valve is degenerative in appearance. There is mild thickening of the mitral valve leaflet(s). There is mild calcification of the mitral valve leaflet(s). Severe mitral annular calcification. Trivial mitral valve regurgitation. Tricuspid Valve: The tricuspid valve is normal in structure. Tricuspid valve regurgitation is mild. Aortic Valve: The aortic valve is tricuspid. There is moderate calcification of the aortic valve. There is moderate thickening of the aortic valve. Aortic valve regurgitation is not visualized. Aortic valve sclerosis/calcification is present, without any  evidence of aortic stenosis. Aortic valve peak gradient measures 5.8 mmHg. Pulmonic Valve: The pulmonic valve was normal in structure. Pulmonic valve regurgitation is trivial. Aorta: The aortic root and ascending aorta are structurally normal, with no evidence of dilitation. Venous: The inferior vena cava is dilated in  size with greater than 50% respiratory variability, suggesting right atrial pressure of 8 mmHg. IAS/Shunts: The atrial septum is grossly normal. Additional Comments: There is a small pleural effusion in the left lateral region.  LEFT VENTRICLE PLAX 2D LVIDd:         3.30 cm     Diastology LVIDs:         2.50 cm  LV e' medial:    7.07 cm/s LV PW:         1.20 cm     LV E/e' medial:  14.9 LV IVS:        1.00 cm     LV e' lateral:   7.72 cm/s LVOT diam:     1.80 cm     LV E/e' lateral: 13.6 LV SV:         32 LV SV Index:   20 LVOT Area:     2.54 cm  LV Volumes (MOD) LV vol d, MOD A2C: 47.3 ml LV vol d, MOD A4C: 40.7 ml LV vol s, MOD A2C: 20.8 ml LV vol s, MOD A4C: 17.7 ml LV SV MOD A2C:     26.5 ml LV SV MOD A4C:     40.7 ml LV SV MOD BP:      24.9 ml RIGHT VENTRICLE            IVC RV Basal diam:  2.80 cm    IVC diam: 2.00 cm RV Mid diam:    1.90 cm RV S prime:     9.25 cm/s TAPSE (M-mode): 1.2 cm LEFT ATRIUM             Index        RIGHT ATRIUM           Index LA diam:        3.70 cm 2.32 cm/m   RA Area:     15.50 cm LA Vol (A2C):   48.6 ml 30.41 ml/m  RA Volume:   36.20 ml  22.65 ml/m LA Vol (A4C):   49.8 ml 31.16 ml/m LA Biplane Vol: 50.6 ml 31.66 ml/m  AORTIC VALVE AV Area (Vmax): 1.77 cm AV Vmax:        120.00 cm/s AV Peak Grad:   5.8 mmHg LVOT Vmax:      83.60 cm/s LVOT Vmean:     59.200 cm/s LVOT VTI:       0.125 m  AORTA Ao Root diam: 3.00 cm Ao Asc diam:  2.80 cm MITRAL VALVE                TRICUSPID VALVE MV Area (PHT): 5.50 cm     TR Peak grad:   24.4 mmHg MV Decel Time: 138 msec     TR Vmax:        247.00 cm/s MR Peak grad: 85.0 mmHg MR Vmax:      461.00 cm/s   SHUNTS MV E velocity: 105.00 cm/s  Systemic VTI:  0.12 m                             Systemic Diam: 1.80 cm Laurance Flatten MD Electronically signed by Laurance Flatten MD Signature Date/Time: 08/27/2021/12:08:36 PM    Final    CT HEAD WO CONTRAST ( )  Result Date: 08/27/2021 CLINICAL DATA:  Mental status change with unknown  cause EXAM: CT HEAD WITHOUT CONTRAST TECHNIQUE: Contiguous axial images were obtained from the base of the skull through the vertex without intravenous contrast. RADIATION DOSE REDUCTION: This exam was performed according to the departmental dose-optimization program which includes automated exposure control, adjustment of the mA and/or kV according to patient size and/or use of iterative reconstruction technique. 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 High-density along the surface of the cortex is attributed to most likely  mineralization, also seen in the basal ganglia. No new area of infarct. No hematoma is suspected. Cerebral volume loss. No hydrocephalus Vascular: No hyperdense vessel or unexpected calcification. Skull: Normal. Negative for fracture or focal lesion. Sinuses/Orbits: No acute finding. Other: Intravenous gas asymmetric to the right paravertebral plexus and temporal fossa, usually from IV access. IMPRESSION: 1. No acute finding. 2. Continued evolution of the remote right MCA territory infarct. Electronically Signed   By: Tiburcio Pea M.D.   On: 08/27/2021 05:06   CT Angio Chest PE W and/or Wo Contrast  Result Date: 08/26/2021 CLINICAL DATA:  Concern for pulmonary embolism. EXAM: CT ANGIOGRAPHY CHEST WITH CONTRAST TECHNIQUE: Multidetector CT imaging of the chest was performed using the standard protocol during bolus administration of intravenous contrast. Multiplanar CT image reconstructions and MIPs were obtained to evaluate the vascular anatomy. RADIATION DOSE REDUCTION: This exam was performed according to the departmental dose-optimization program which includes automated exposure control, adjustment of the mA and/or kV according to patient size and/or use of iterative reconstruction technique. CONTRAST:  42mL OMNIPAQUE IOHEXOL 350 MG/ML SOLN COMPARISON:  Chest radiograph dated 08/26/2021. FINDINGS: Cardiovascular:  Borderline cardiomegaly. Small pericardial effusion measuring 7 mm in thickness. There is calcification of the mitral annulus. Coronary vascular calcifications. And mild atherosclerotic calcification of the thoracic aorta. No aneurysmal dilatation. No pulmonary artery embolus identified. Mediastinum/Nodes: Right hilar adenopathy measures 9 mm short axis. The esophagus is grossly unremarkable. No mediastinal fluid collection. Lungs/Pleura: Moderate bilateral pleural effusions with partial compressive atelectasis of the lower lobes versus pneumonia. There is diffuse interstitial and interlobular septal prominence consistent with edema. Scattered clusters of ground-glass density primarily throughout the right upper lobe, likely edema. Pneumonia is not excluded. There is no pneumothorax. The central airways are patent. Upper Abdomen: No acute abnormality. Musculoskeletal: Degenerative changes of the spine. No acute osseous pathology. Review of the MIP images confirms the above findings. IMPRESSION: 1. No CT evidence of pulmonary artery embolus. 2. Moderate bilateral pleural effusions with compressive atelectasis of the lower lobes versus pneumonia. 3. Scattered clusters of ground-glass density primarily throughout the right upper lobe, likely edema. Pneumonia is not excluded. 4. Borderline cardiomegaly with small pericardial effusion. 5. Aortic Atherosclerosis (ICD10-I70.0). Electronically Signed   By: Elgie Collard M.D.   On: 08/26/2021 21:11   DG Chest Portable 1 View  Result Date: 08/26/2021 CLINICAL DATA:  Dyspnea EXAM: PORTABLE CHEST 1 VIEW COMPARISON:  Radiographs 07/05/2021 FINDINGS: Enlarged cardiomediastinal silhouette. New hazy airspace and interstitial opacities greatest in the lung bases. New layering small-moderate left small right pleural effusions. Aortic calcification. Mitral annular calcification. IMPRESSION: 1. Findings favored to represent pulmonary edema though pneumonia is difficult to  exclude. 2. Small-moderate left and probable small right pleural effusions. 3. Stable cardiomegaly. Electronically Signed   By: Minerva Fester M.D.   On: 08/26/2021 13:52    Cardiac Studies   Echo 08/27/2021 1. Left ventricular ejection fraction, by estimation, is 50 to 55%. The  left ventricle has low normal function. The left ventricle has no regional  wall motion abnormalities. Diastolic function indeterminant due to Afib.   2. Right ventricular systolic function is mildly reduced. The right  ventricular size is normal. There is normal pulmonary artery systolic  pressure. The estimated right ventricular systolic pressure is 32.4 mmHg.   3. Left atrial size was mild to moderately dilated.   4. Right atrial size was mildly dilated.   5. There is a moderate sized, circumferential perficardial effusion  present that appears largest around the  RA/RV base. The pericardial  effusion is circumferential. There is no evidence of cardiac tamponade.  There is prominent epicardial fat present.   6. The mitral valve is degenerative. Trivial mitral valve regurgitation.  Severe mitral annular calcification.   7. The aortic valve is tricuspid. There is moderate calcification of the  aortic valve. There is moderate thickening of the aortic valve. Aortic  valve regurgitation is not visualized. Aortic valve  sclerosis/calcification is present, without any evidence  of aortic stenosis.   8. The inferior vena cava is dilated in size with >50% respiratory  variability, suggesting right atrial pressure of 8 mmHg.   Comparison(s): Compared to prior TTE, the pericardial effusion is now  moderate (previously trivial). Otherwise, there is no significant change.   Patient Profile     86 y.o. female with PMH of afib and h/o syncope. Patient was recently admitted in May with AMS and found to have acute intracranial hemorrhage with afib who has been admitted again with acute CHF and afib with RVR  Assessment &  Plan    Persistent atrial fibrillation with RVR  - diagnosed in 2019, initially placed on Eliquis but stopped later and failed to return for follow up  - admitted in May with AMS due to R MCA large infarct with hemorrhagic transformation likely due to afib with embolism. Placed on metoprolol and amiodarone. Cleared by neurology on discharge to start Eliquis 2.5mg  BID 07/16/2021  - brought back to the hospital with respiratory distress and afib with RVR. Eliquis held due to AMS and acute anemia  - would like to switch IV metoprolol to PO via PEG tube if possible. HR 100-120 overnight. Digoxin added yesterday to help with rate control. PO Amiodarone stopped.   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 Appears to be near euvolemic. Diminished breath in the left base of the lung consistent with pleural effusion.   Anemia: hgb 8.7, stable. Improved from 6.5 on 7/18  AMS: improved as well. Alert to self, president and location, confused about the year  Pericardial effusion: enlarging pericardial effusion on echo 7/19, consider repeat limited echo in 2-3 weeks.   Hypothyroidism: TSH 22. Amiodarone stopped. Management per primary team.        For questions or updates, please contact [facility] Please consult [email or web address] for contact info under        Signed, 8/19, PA  08/28/2021, 7:58 AM    Patient seen and examined with 08/30/2021 PA.  Agree as above, with the following exceptions and changes as noted below. No chest pain or palpitations. Gen: NAD, CV: iRRR, soft systolic murmur, Lungs: clear, Abd: soft, Extrem: Warm, 2+ puffy edema, Neuro/Psych: slow cognition, alert, oriented All available labs, radiology testing, previous records reviewed. Rates improved on digoxin. Synthroid per primary service for elevated TSH hypothyroidism. Holding amio for any  possible contribution. Continue digoxin and monitor rates. AC on hold due to acute anemia. Reassess diuresis daily.  Azalee Course, MD

## 2021-08-28 NOTE — Progress Notes (Signed)
Patient currently on 3L [place] and tolerating well, BIPAP not needed at this time.

## 2021-08-28 NOTE — Progress Notes (Addendum)
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 In the ED she was found to be in A-fib with RVR and respiratory distress.  She was placed on BiPAP for increased work of breathing.  Due to volume overload patient was given IV Lasix. Patient was also found to be in metabolic encephalopathy.   Subjective   Patient seen and examined, continues to be lethargic.  Answers few questions appropriately.  Sodium still 125.  TSH found to be elevated at 22.     Assessment/Plan:   Atrial fibrillation with RVR -Patient was on amiodarone which has been discontinued due to elevated TSH -Continue metoprolol 5 mg IV every 6 hours -Anticoagulation on hold due to anemia -Cardiology following  Hypothyroidism, new diagnosis -Likely in the setting of amiodarone use -Amiodarone has been discontinued per cardiology -We will start Synthroid 100 mcg daily  Metabolic encephalopathy -Likely multifactorial from hyponatremia, also found to have elevated TSH; 22 likely in the setting of amiodarone use -Amiodarone has been discontinued per cardiology -We will start Synthroid 100 mcg daily  Normocytic anemia -Patient has chronic anemia -Hemoglobin dropped to 6.6 yesterday -S/p 1 unit PRBC -This morning hemoglobin is 8.7; likely improvement due to diuresis -Follow CBC in a.m.  Acute diastolic CHF -Chest x-ray showed bilateral pleural effusion -Also chest x-ray concerning for pulmonary edema -Patient started on furosemide 40 mg IV every 12 hours -Net -1.4 L -Cardiology consulted  Left upper extremity swelling -No DVT  noted on the vascular ultrasound -Findings consistent with indeterminate superficial vein thrombosis involving the left basilic vein   Hyponatremia -Likely from hypervolemic hyponatremia from underlying CHF -Sodium improved to 125 -Follow BMP in am   Medications     atorvastatin  40 mg Per Tube Daily   feeding supplement (OSMOLITE 1.5 CAL)  300 mL Per Tube TID PC & HS   free water  160 mL Per Tube TID PC & HS   furosemide  40 mg Intravenous Q12H   ipratropium-albuterol  3 mL Nebulization Q6H   levothyroxine  100 mcg Oral Q0600   metoprolol tartrate  5 mg Intravenous Q6H     Data Reviewed:   CBG:  Recent Labs  Lab 08/28/21 0822 08/28/21 1231  GLUCAP 77 76    SpO2: 100 % O2 Flow Rate (L/min): 3 L/min FiO2 (%): 40 %    Vitals:   08/28/21 0813 08/28/21 0846 08/28/21 1229 08/28/21 1430  BP: 115/73  129/89   Pulse: (!) 104  (!) 101   Resp: (!) 23  18   Temp: 97.7 F (36.5 C)  97.9 F (36.6 C)   TempSrc: Oral  Oral   SpO2: 100% 100% 100% 100%  Weight:      Height:          Data Reviewed:  Basic Metabolic Panel: Recent Labs  Lab 08/26/21 2327 08/27/21 0405 08/27/21 1240 08/27/21 2120 08/28/21 0216  NA 120* 123* 123* 125* 125*  K 4.8 4.8 4.4 4.0 4.1  CL 85* 83* 86* 90* 89*  CO2 23 24 22 24 23   GLUCOSE 111* 117* 98 84 79  BUN 35* 33* 30* 28* 29*  CREATININE 0.94  0.99 0.90 0.89 0.94  CALCIUM 8.0* 8.5* 8.1* 8.4* 8.4*  MG  --  1.9  --   --   --     CBC: Recent Labs  Lab 08/26/21 1428 08/26/21 1445 08/26/21 2327 08/27/21 0405 08/27/21 1240 08/27/21 2120 08/28/21 0216  WBC 13.5*  --  9.7 10.1 10.2 10.4 10.7*  NEUTROABS 11.0*  --   --   --   --   --   --   HGB 7.7*   < > 6.6* 8.6* 8.6* 8.7* 8.7*  HCT 24.0*   < > 19.6* 25.7* 25.6* 26.2* 25.8*  MCV 94.9  --  92.0 91.5 89.8 88.8 89.0  PLT 272  --  237 249 240 256 252   < > = values in this interval not displayed.    LFT Recent Labs  Lab 08/26/21 1428  AST 86*  ALT 134*  ALKPHOS 220*   BILITOT 0.7  PROT 7.0  ALBUMIN 2.7*     Antibiotics: Anti-infectives (From admission, onward)    None        DVT prophylaxis: SCDs  Code Status: Full code  Family Communication: No family at bedside   CONSULTS cardiology   Objective    Physical Examination:   General: Appears in no acute distress Cardiovascular: S1-S2, irregular Respiratory: Clear to auscultation bilaterally, no wheezing or crackles auscultated Abdomen: Abdomen is soft, nontender, no organomegaly Extremities: No edema in the lower extremities Neurologic: Alert oriented to self only, answers few questions appropriately   Status is: Inpatient: Atrial fibrillation with RVR, diastolic heart failure    Pressure Injury 07/03/21 Sacrum Medial Deep Tissue Pressure Injury - Purple or maroon localized area of discolored intact skin or blood-filled blister due to damage of underlying soft tissue from pressure and/or shear. (Active)  07/03/21 1642  Location: Sacrum  Location Orientation: Medial  Staging: Deep Tissue Pressure Injury - Purple or maroon localized area of discolored intact skin or blood-filled blister due to damage of underlying soft tissue from pressure and/or shear.  Wound Description (Comments):   Present on Admission: Yes        Gwendolyn [name] S Keiffer Piper   [facility] If 7PM-7AM, please contact night-coverage at [email or web address], Office  [phone]   08/28/2021, 4:16 PM  LOS: 2 days

## 2021-08-28 NOTE — Progress Notes (Signed)
Initial Nutrition Assessment  DOCUMENTATION CODES:   Not applicable  INTERVENTION:   -Initiate bolus feedings:   300 ml Osmolite 1.5 via PEG 4 times daily  80 ml free water flush before and after each feeding administration  Tube feeding regimen provides 1800 kcal (100% of needs), 75 grams of protein, and 972 ml of H2O.  Total free water: 1612 ml daily  NUTRITION DIAGNOSIS:   Inadequate oral intake related to inability to eat as evidenced by NPO status.  GOAL:   Patient will meet greater than or equal to 90% of their needs  MONITOR:   TF tolerance  REASON FOR ASSESSMENT:   Consult Enteral/tube feeding initiation and management  ASSESSMENT:   Pt with past medical history of persistent atrial fibrillation, hypertension, hyperlipidemia and recent right MCA stroke with hemorrhagic transformation 06/2021 (S/P PEG 5/30) who presents with complaints of shortness of breath and peripheral edema.  Pt admitted with a-fib with RVR.   5/30- s/p PEG placement  Reviewed I/O's: -700 ml x 24 hours and -1.4 L since admission  UOP: 700 ml x 24 hours  Pt unavailable at time of visit. Attempted to speak with pt via call to hospital room phone, however, unable to reach. RD unable to obtain further nutrition-related history or complete nutrition-focused physical exam at this time.    Pt is NPO and PEG dependent. Unsure of home TF regimen. Pt is a resident at [facility].    Reviewed wt hx; wt has been stable over the past 2 months.   Medications reviewed and include lasix.   Labs reviewed: Na: 125, Phos: 2.0, CBGS: 77.   Diet Order:   Diet Order             Diet NPO time specified Except for: Ice Chips, Sips with Meds  Diet effective now                   EDUCATION NEEDS:   Not appropriate for education at this time  Skin:  Skin Assessment: Skin Integrity Issues: Skin Integrity Issues:: DTI, Other (Comment) DTI: sacrum Other: skin tear rt ear  Last BM:   08/27/21  Height:   Ht Readings from Last 1 Encounters:  08/26/21 5\' 1"  (1.549 m)    Weight:   Wt Readings from Last 1 Encounters:  08/26/21 61.2 kg    Ideal Body Weight:  47.7 kg  BMI:  Body mass index is 25.51 kg/m.  Estimated Nutritional Needs:   Kcal:  1650-1850  Protein:  80-95 grams  Fluid:  > 1.6 L    08/28/21, RD, LDN, CDCES Registered Dietitian II Certified Diabetes Care and Education Specialist Please refer to [facility] for RD and/or RD on-call/weekend/after hours pager

## 2021-08-28 NOTE — TOC Initial Note (Signed)
Transition of Care [facility]) - Initial/Assessment Note    Patient Details  Name: Gwendolyn [name] MRN: [id number] Date of Birth: May 30, 1935  [facility] CM/SW Contact:    Delilah Shan, LCSWA Phone Number: 08/28/2021, 4:39 PM  Clinical Narrative:                  CSW spoke with patient at bedside. Patient reports she comes from [facility]. Patient confirmed plan is to return back to [facility] when medically ready for dc. CSW spoke with Georgeann Oppenheim who confirmed patient is from [place] long term and can return when medically ready for dc.CSW will continue to follow and assist with patients dc planning needs.   Expected Discharge Plan: Skilled Nursing Facility Barriers to Discharge: Continued Medical Work up   Patient Goals and CMS Choice Patient states their goals for this hospitalization and ongoing recovery are:: SNF CMS Medicare.gov Compare Post Acute Care list provided to:: Patient Choice offered to / list presented to : Patient  Expected Discharge Plan and Services Expected Discharge Plan: Skilled Nursing Facility In-house Referral: Clinical Social Work     Living arrangements for the past 2 months: Skilled Holiday representative (From [employer] term)                                      Prior Living Arrangements/Services Living arrangements for the past 2 months: Skilled Holiday representative (From [facility] term) Lives with:: Investment banker, operational (From [facility]) Patient language and need for interpreter reviewed:: Yes Do you feel safe going back to the place where you live?: Yes      Need for Family Participation in Patient Care: Yes (Comment) Care giver support system in place?: Yes (comment)   Criminal Activity/Legal Involvement Pertinent to Current Situation/Hospitalization: No - Comment as needed  Activities of Daily Living      Permission Sought/Granted Permission sought to share  information with : Case Manager, Magazine features editor, Family Supports Permission granted to share information with : Yes, Verbal Permission Granted  Share Information with NAME: [name]  Permission granted to share info w AGENCY: SNF  Permission granted to share info w Relationship: Niece  Permission granted to share info w Contact Information: [name] [phone]  Emotional Assessment Appearance:: Appears stated age Attitude/Demeanor/Rapport: Gracious Affect (typically observed): Calm Orientation: : Oriented to Self, Oriented to Place, Oriented to Situation Alcohol / Substance Use: Not Applicable Psych Involvement: No (comment)  Admission diagnosis:  Shortness of breath [R06.02] Atrial fibrillation with rapid ventricular response (HCC) [I48.91] Bilateral lower extremity edema [R60.0] Patient Active Problem List   Diagnosis Date Noted   Normocytic anemia 08/27/2021   Acute on chronic diastolic CHF (congestive heart failure) (HCC) 08/27/2021   Acute metabolic encephalopathy 08/27/2021   Edema of left upper extremity 08/27/2021   Atrial fibrillation with rapid ventricular response (HCC) 08/26/2021   Lactic acidosis 08/26/2021   Elevated troponin level not due myocardial infarction 08/26/2021   Acute cardiogenic pulmonary edema (HCC) 08/26/2021   Bilateral pleural effusion 08/26/2021   Hyponatremia 08/26/2021   Essential hypertension 08/26/2021   Mixed hyperlipidemia 08/26/2021   Persistent atrial fibrillation (HCC) 07/07/2021   Pressure injury of skin 07/07/2021   Intracerebral hemorrhage (HCC) 07/03/2021   PCP:  Irena Reichmann, DO Pharmacy:  No Pharmacies Listed    Social Determinants of Health (SDOH) Interventions    Readmission Risk  Interventions     No data to display

## 2021-08-29 ENCOUNTER — Inpatient Hospital Stay (HOSPITAL_COMMUNITY): Payer: Medicare Other

## 2021-08-29 DIAGNOSIS — I5033 Acute on chronic diastolic (congestive) heart failure: Secondary | ICD-10-CM | POA: Diagnosis not present

## 2021-08-29 DIAGNOSIS — I4891 Unspecified atrial fibrillation: Secondary | ICD-10-CM | POA: Diagnosis not present

## 2021-08-29 DIAGNOSIS — I501 Left ventricular failure: Secondary | ICD-10-CM | POA: Diagnosis not present

## 2021-08-29 DIAGNOSIS — G9341 Metabolic encephalopathy: Secondary | ICD-10-CM | POA: Diagnosis not present

## 2021-08-29 LAB — CULTURE, BLOOD (ROUTINE X 2): Special Requests: ADEQUATE

## 2021-08-29 LAB — GLUCOSE, CAPILLARY
Glucose-Capillary: 122 mg/dL — ABNORMAL HIGH (ref 70–99)
Glucose-Capillary: 167 mg/dL — ABNORMAL HIGH (ref 70–99)
Glucose-Capillary: 218 mg/dL — ABNORMAL HIGH (ref 70–99)

## 2021-08-29 LAB — COMPREHENSIVE METABOLIC PANEL
ALT: 70 U/L — ABNORMAL HIGH (ref 0–44)
AST: 35 U/L (ref 15–41)
Albumin: 2.1 g/dL — ABNORMAL LOW (ref 3.5–5.0)
Alkaline Phosphatase: 145 U/L — ABNORMAL HIGH (ref 38–126)
Anion gap: 9 (ref 5–15)
BUN: 35 mg/dL — ABNORMAL HIGH (ref 8–23)
CO2: 24 mmol/L (ref 22–32)
Calcium: 7.9 mg/dL — ABNORMAL LOW (ref 8.9–10.3)
Chloride: 91 mmol/L — ABNORMAL LOW (ref 98–111)
Creatinine, Ser: 0.95 mg/dL (ref 0.44–1.00)
GFR, Estimated: 59 mL/min — ABNORMAL LOW (ref >60)
Glucose, Bld: 175 mg/dL — ABNORMAL HIGH (ref 70–99)
Potassium: 3.9 mmol/L (ref 3.5–5.1)
Sodium: 124 mmol/L — ABNORMAL LOW (ref 135–145)
Total Bilirubin: 1.3 mg/dL — ABNORMAL HIGH (ref 0.3–1.2)
Total Protein: 5.6 g/dL — ABNORMAL LOW (ref 6.5–8.1)

## 2021-08-29 LAB — CBC
HCT: 25.4 % — ABNORMAL LOW (ref 36.0–46.0)
Hemoglobin: 8.4 g/dL — ABNORMAL LOW (ref 12.0–15.0)
MCH: 29.8 pg (ref 26.0–34.0)
MCHC: 33.1 g/dL (ref 30.0–36.0)
MCV: 90.1 fL (ref 80.0–100.0)
Platelets: 260 10*3/uL (ref 150–400)
RBC: 2.82 MIL/uL — ABNORMAL LOW (ref 3.87–5.11)
RDW: 16.2 % — ABNORMAL HIGH (ref 11.5–15.5)
WBC: 9.8 10*3/uL (ref 4.0–10.5)
nRBC: 0 % (ref 0.0–0.2)

## 2021-08-29 MED ORDER — DIGOXIN 0.25 MG/ML IJ SOLN
0.2500 mg | Freq: Every day | INTRAMUSCULAR | Status: DC
Start: 2021-08-29 — End: 2021-09-01
  Administered 2021-08-29 – 2021-09-01 (×4): 0.25 mg via INTRAVENOUS
  Filled 2021-08-29 (×4): qty 1

## 2021-08-29 MED ORDER — POLYETHYLENE GLYCOL 3350 17 G PO PACK
17.0000 g | PACK | Freq: Two times a day (BID) | ORAL | Status: DC
  Administered 2021-08-29 – 2021-08-31 (×4): 17 g via ORAL
  Filled 2021-08-29 (×4): qty 1

## 2021-08-29 MED ORDER — IOHEXOL 300 MG/ML  SOLN
100.0000 mL | Freq: Once | INTRAMUSCULAR | Status: AC | PRN
  Administered 2021-08-29: 100 mL via INTRAVENOUS

## 2021-08-29 NOTE — Plan of Care (Signed)

## 2021-08-29 NOTE — Progress Notes (Signed)
Pt is on 3L [place] and not requiring BIPAP at this time. RT will continue to monitor.

## 2021-08-29 NOTE — Progress Notes (Addendum)
Progress Note  [name] Date of Encounter: 08/29/2021  [facility] Cardiologist: Chrystie Nose, MD   Subjective   Did not respond to questions this morning.   Inpatient Medications    Scheduled Meds:  atorvastatin  40 mg Per Tube Daily   digoxin  0.25 mg Intravenous Daily   feeding supplement (OSMOLITE 1.5 CAL)  300 mL Per Tube TID PC & HS   free water  160 mL Per Tube TID PC & HS   furosemide  40 mg Intravenous Q12H   ipratropium-albuterol  3 mL Nebulization BID   levothyroxine  100 mcg Oral Q0600   metoprolol tartrate  5 mg Intravenous Q6H   Continuous Infusions:  PRN Meds: acetaminophen **OR** acetaminophen, ipratropium-albuterol, ondansetron **OR** ondansetron (ZOFRAN) IV, polyethylene glycol   Vital Signs    Vitals:   08/28/21 2122 08/29/21 0028 08/29/21 0403 08/29/21 0821  BP: 95/65 (!) 103/52 125/78   Pulse: 100 (!) 106 93   Resp: Temp: 98.5 F (36.9 C) 98.4 F (36.9 C) 98.5 F (36.9 C)   TempSrc: Oral Oral Oral   SpO2: 98% 94% 99% 99%  Weight:   65.1 kg   Height:        Intake/Output Summary (Last 24 hours) at 08/29/2021 0857 Last data filed at 08/29/2021 0600 Gross per 24 hour  Intake 300 ml  Output 800 ml  Net -500 ml      08/29/2021    4:03 AM 08/26/2021    1:24 PM 07/09/2021    4:00 AM  Last 3 Weights  Weight (lbs) 143 lb 8.3 oz 135 lb 135 lb 5.8 oz  Weight (kg) 65.1 kg 61.236 kg 61.4 kg      Telemetry    Atrial fibrillation with HR 90s to 110s - Personally Reviewed  ECG    Atrial fibrillation with RVR - Personally Reviewed  Physical Exam   GEN: No acute distress. Did not respond to questions.   Neck: No JVD Cardiac: RRR, no murmurs, rubs, or gallops.  Respiratory: Clear to auscultation bilaterally. GI: Soft, nontender, non-distended  MS: No deformity. L arm swollen Neuro:  Nonfocal  Psych: Normal affect   Labs    High Sensitivity Troponin:   Recent Labs  Lab 08/26/21 1428  08/26/21 1700  TROPONINIHS 24* 22*     Chemistry Recent Labs  Lab 08/26/21 1428 08/26/21 1445 08/27/21 0405 08/27/21 1240 08/27/21 2120 08/28/21 0216 08/29/21 0250  NA 119*   < > 123*   < > 125* 125* 124*  K 4.8   < > 4.8   < > 4.0 4.1 3.9  CL 84*   < > 83*   < > 90* 89* 91*  CO2 21*   < > 24   < > GLUCOSE 119*   < > 117*   < > 84 79 175*  BUN 37*   < > 33*   < > 28* 29* 35*  CREATININE 0.81   < > 0.99   < > 0.89 0.94 0.95  CALCIUM 8.3*   < > 8.5*   < > 8.4* 8.4* 7.9*  MG  --   --  1.9  --   --   --   --   PROT 7.0  --   --   --   --   --  5.6*  ALBUMIN 2.7*  --   --   --   --   --  2.1*  AST 86*  --   --   --   --   --  35  ALT 134*  --   --   --   --   --  70*  ALKPHOS 220*  --   --   --   --   --  145*  BILITOT 0.7  --   --   --   --   --  1.3*  GFRNONAA >60   < > 57*   < > >60 >60 59*  ANIONGAP 14   < > 16*   < > 11 13 9    < > = values in this interval not displayed.    Lipids No results for input(s): "CHOL", "TRIG", "HDL", "LABVLDL", "LDLCALC", "CHOLHDL" in the last 168 hours.  Hematology Recent Labs  Lab 08/27/21 2120 08/28/21 0216 08/29/21 0250  WBC 10.4 10.7* 9.8  RBC 2.95* 2.90* 2.82*  HGB 8.7* 8.7* 8.4*  HCT 26.2* 25.8* 25.4*  MCV 88.8 89.0 90.1  MCH 29.5 30.0 29.8  MCHC 33.2 33.7 33.1  RDW 16.6* 16.6* 16.2*  PLT 256 252 260   Thyroid  Recent Labs  Lab 08/26/21 2325  TSH 22.629*    BNP Recent Labs  Lab 08/26/21 1428  BNP 437.4*    DDimer No results for input(s): "DDIMER" in the last 168 hours.   Radiology    CT PELVIS W CONTRAST  Result Date: 08/29/2021 CLINICAL DATA:  Soft tissue mass in the groin. EXAM: CT PELVIS WITH CONTRAST TECHNIQUE: Multidetector CT imaging of the pelvis was performed using the standard protocol following the bolus administration of intravenous contrast. RADIATION DOSE REDUCTION: This exam was performed according to the departmental dose-optimization program which includes automated exposure control,  adjustment of the mA and/or kV according to patient size and/or use of iterative reconstruction technique. CONTRAST:  08/31/2021 OMNIPAQUE IOHEXOL 300 MG/ML  SOLN COMPARISON:  None Available. FINDINGS: Urinary Tract:  No abnormality visualized. Bowel: Bowel loops of the pelvis are nondilated. Advanced diverticular disease noted sigmoid colon. Prominent rectal stool volume. Small bowel loops are contained in a large right groin hernia. No evidence for small bowel obstruction. No small bowel wall thickening. No substantial edema or fluid within the hernia sac. Vascular/Lymphatic: No pathologically enlarged lymph nodes. No significant vascular abnormality seen. Reproductive:  Unremarkable. Other:  No substantial intraperitoneal free fluid. Musculoskeletal: No worrisome lytic or sclerotic osseous abnormality. Diffuse body wall edema evident. IMPRESSION: 1. Large right groin hernia containing small bowel loops. No complicating features. 2. Advanced diverticular disease sigmoid colon without evidence for acute diverticulitis. 3. Prominent rectal stool volume. 4. Diffuse body wall edema. Electronically Signed   By: M.D.   On: 08/29/2021 08:20   EEG adult  Result Date: 08/27/2021 08/29/2021, MD     08/27/2021  3:03 PM [name] MRN: [facility] Attending: [id number] Referring Physician/Provider: Charlsie Quest, MD Date: 08/27/2021 Duration: 22.02 mins Patient history: 85yo f with ams. EEG to evaluate for seizure Level of alertness: Awake AEDs during EEG study: None Technical aspects: This EEG study was done with scalp electrodes positioned according to the 10-20 International system of electrode placement. Electrical activity was acquired at a sampling rate of 500Hz  and reviewed with a high frequency filter of 70Hz  and a low frequency filter of 1Hz . EEG data were recorded continuously and digitally stored. Description: No clear posterior dominant rhythm was seen. EEG  showed continuous generalized 3 to 6  Hz theta-delta slowing.  Hyperventilation and photic stimulation were not performed.   ABNORMALITY -Continuous slow, generalized IMPRESSION: This study is suggestive of moderate diffuse encephalopathy, nonspecific etiology. No seizures or epileptiform discharges were seen throughout the recording. Gwendolyn [name]   VAS [place] UPPER EXTREMITY VENOUS DUPLEX  Result Date: 08/27/2021 UPPER VENOUS STUDY  Patient Name:  Gwendolyn [name]  Date of Exam:   08/27/2021 Medical Rec #: [id number]         Accession #:    [id number] Date of Birth: [birth year]        Patient Gender: F Patient Age:   69 years Exam Location:  [facility] Procedure:      VAS [place] UPPER EXTREMITY VENOUS DUPLEX Referring Phys: Shauna Hugh --------------------------------------------------------------------------------  Indications: "DVT" Comparison Study: No previous exam noted. Performing Technologist: Magdalene River BS, RVT  Examination Guidelines: A complete evaluation includes B-mode imaging, spectral Doppler, color Doppler, and power Doppler as needed of all accessible portions of each vessel. Bilateral testing is considered an integral part of a complete examination. Limited examinations for reoccurring indications may be performed as noted.  Right Findings: +----------+------------+---------+-----------+----------+-------+ RIGHT     CompressiblePhasicitySpontaneousPropertiesSummary +----------+------------+---------+-----------+----------+-------+ Subclavian    Full       Yes       Yes                      +----------+------------+---------+-----------+----------+-------+  Left Findings: +----------+------------+---------+-----------+----------+-------+ LEFT      CompressiblePhasicitySpontaneousPropertiesSummary +----------+------------+---------+-----------+----------+-------+ IJV           Full       Yes       Yes                       +----------+------------+---------+-----------+----------+-------+ Subclavian    Full       Yes       Yes                      +----------+------------+---------+-----------+----------+-------+ Axillary      Full       Yes       Yes                      +----------+------------+---------+-----------+----------+-------+ Brachial      Full                                          +----------+------------+---------+-----------+----------+-------+ Radial        Full                                          +----------+------------+---------+-----------+----------+-------+ Ulnar         Full                                          +----------+------------+---------+-----------+----------+-------+ Cephalic      Full                                          +----------+------------+---------+-----------+----------+-------+ Basilic     Partial  Yes       Yes                      +----------+------------+---------+-----------+----------+-------+ The left basilic vein at the mid upper arm is partially compressible with no obstruction of flow throughout.  Summary:  Right: No evidence of thrombosis in the subclavian.  Left: No evidence of deep vein thrombosis in the upper extremity. Findings consistent with age indeterminate superficial vein thrombosis involving the left basilic vein. Wall thickening noted in the basilic at the mid and distal upper arm.  *See table(s) above for measurements and observations.  Diagnosing physician: Sherald Hess MD Electronically signed by Sherald Hess MD on 08/27/2021 at 1:01:53 PM.    Final    ECHOCARDIOGRAM COMPLETE  Result Date: 08/27/2021    ECHOCARDIOGRAM REPORT   Patient Name:   Gwendolyn [name] Date of Exam: 08/27/2021 Medical Rec #:  [id number]        Height:       61.0 in Accession #:    [id number]       Weight:       135.0 lb Date of Birth:  1935/04/26       BSA:          1.598 m Patient Age:    85 years         BP:            131/83 mmHg Patient Gender: F                HR:           113 bpm. Exam Location:  Inpatient Procedure: 2D Echo, Cardiac Doppler and Color Doppler Indications:    CHF  History:        Patient has prior history of Echocardiogram examinations.                 Arrythmias:Atrial Fibrillation; Risk Factors:Hypertension.  Sonographer:    Cleatis Polka Referring Phys: [id number] Deno Lunger [place] IMPRESSIONS  1. Left ventricular ejection fraction, by estimation, is 50 to 55%. The left ventricle has low normal function. The left ventricle has no regional wall motion abnormalities. Diastolic function indeterminant due to Afib.  2. Right ventricular systolic function is mildly reduced. The right ventricular size is normal. There is normal pulmonary artery systolic pressure. The estimated right ventricular systolic pressure is 32.4 mmHg.  3. Left atrial size was mild to moderately dilated.  4. Right atrial size was mildly dilated.  5. There is a moderate sized, circumferential perficardial effusion present that appears largest around the RA/RV base. The pericardial effusion is circumferential. There is no evidence of cardiac tamponade. There is prominent epicardial fat present.  6. The mitral valve is degenerative. Trivial mitral valve regurgitation. Severe mitral annular calcification.  7. The aortic valve is tricuspid. There is moderate calcification of the aortic valve. There is moderate thickening of the aortic valve. Aortic valve regurgitation is not visualized. Aortic valve sclerosis/calcification is present, without any evidence of aortic stenosis.  8. The inferior vena cava is dilated in size with >50% respiratory variability, suggesting right atrial pressure of 8 mmHg. Comparison(s): Compared to prior TTE, the pericardial effusion is now moderate (previously trivial). Otherwise, there is no significant change. FINDINGS  Left Ventricle: Left ventricular ejection fraction, by estimation, is 50 to 55%. The left  ventricle has low normal function. The left ventricle has no regional wall motion abnormalities. The left ventricular internal cavity size was normal in size. There  is no left ventricular hypertrophy. Diastolic function indeterminant due to Afib. Right Ventricle: The right ventricular size is normal. No increase in right ventricular wall thickness. Right ventricular systolic function is mildly reduced. There is normal pulmonary artery systolic pressure. The tricuspid regurgitant velocity is 2.47 m/s, and with an assumed right atrial pressure of 8 mmHg, the estimated right ventricular systolic pressure is 32.4 mmHg. Left Atrium: Left atrial size was mild to moderately dilated. Right Atrium: Right atrial size was mildly dilated. Pericardium: There is very prominent epicardial fat present. There is a moderate sized, circumferential perficardial effusion present that appears largest around the RA/RV base. The pericardial effusion is circumferential. There is no evidence of cardiac  tamponade. Mitral Valve: The mitral valve is degenerative in appearance. There is mild thickening of the mitral valve leaflet(s). There is mild calcification of the mitral valve leaflet(s). Severe mitral annular calcification. Trivial mitral valve regurgitation. Tricuspid Valve: The tricuspid valve is normal in structure. Tricuspid valve regurgitation is mild. Aortic Valve: The aortic valve is tricuspid. There is moderate calcification of the aortic valve. There is moderate thickening of the aortic valve. Aortic valve regurgitation is not visualized. Aortic valve sclerosis/calcification is present, without any  evidence of aortic stenosis. Aortic valve peak gradient measures 5.8 mmHg. Pulmonic Valve: The pulmonic valve was normal in structure. Pulmonic valve regurgitation is trivial. Aorta: The aortic root and ascending aorta are structurally normal, with no evidence of dilitation. Venous: The inferior vena cava is dilated in size with  greater than 50% respiratory variability, suggesting right atrial pressure of 8 mmHg. IAS/Shunts: The atrial septum is grossly normal. Additional Comments: There is a small pleural effusion in the left lateral region.  LEFT VENTRICLE PLAX 2D LVIDd:         3.30 cm     Diastology LVIDs:         2.50 cm     LV e' medial:    7.07 cm/s LV PW:         1.20 cm     LV E/e' medial:  14.9 LV IVS:        1.00 cm     LV e' lateral:   7.72 cm/s LVOT diam:     1.80 cm     LV E/e' lateral: 13.6 LV SV:         32 LV SV Index:   20 LVOT Area:     2.54 cm  LV Volumes (MOD) LV vol d, MOD A2C: 47.3 ml LV vol d, MOD A4C: 40.7 ml LV vol s, MOD A2C: 20.8 ml LV vol s, MOD A4C: 17.7 ml LV SV MOD A2C:     26.5 ml LV SV MOD A4C:     40.7 ml LV SV MOD BP:      24.9 ml RIGHT VENTRICLE            IVC RV Basal diam:  2.80 cm    IVC diam: 2.00 cm RV Mid diam:    1.90 cm RV S prime:     9.25 cm/s TAPSE (M-mode): 1.2 cm LEFT ATRIUM             Index        RIGHT ATRIUM           Index LA diam:        3.70 cm 2.32 cm/m   RA Area:     15.50 cm LA Vol (A2C):   48.6 ml 30.41 ml/m  RA Volume:  36.20 ml  22.65 ml/m LA Vol (A4C):   49.8 ml 31.16 ml/m LA Biplane Vol: 50.6 ml 31.66 ml/m  AORTIC VALVE AV Area (Vmax): 1.77 cm AV Vmax:        120.00 cm/s AV Peak Grad:   5.8 mmHg LVOT Vmax:      83.60 cm/s LVOT Vmean:     59.200 cm/s LVOT VTI:       0.125 m  AORTA Ao Root diam: 3.00 cm Ao Asc diam:  2.80 cm MITRAL VALVE                TRICUSPID VALVE MV Area (PHT): 5.50 cm     TR Peak grad:   24.4 mmHg MV Decel Time: 138 msec     TR Vmax:        247.00 cm/s MR Peak grad: 85.0 mmHg MR Vmax:      461.00 cm/s   SHUNTS MV E velocity: 105.00 cm/s  Systemic VTI:  0.12 m                             Systemic Diam: 1.80 cm Laurance Flatten MD Electronically signed by Laurance Flatten MD Signature Date/Time: 08/27/2021/12:08:36 PM    Final     Cardiac Studies   Echo 08/27/2021 1. Left ventricular ejection fraction, by estimation, is 50 to 55%. The  left  ventricle has low normal function. The left ventricle has no regional  wall motion abnormalities. Diastolic function indeterminant due to Afib.   2. Right ventricular systolic function is mildly reduced. The right  ventricular size is normal. There is normal pulmonary artery systolic  pressure. The estimated right ventricular systolic pressure is 32.4 mmHg.   3. Left atrial size was mild to moderately dilated.   4. Right atrial size was mildly dilated.   5. There is a moderate sized, circumferential perficardial effusion  present that appears largest around the RA/RV base. The pericardial  effusion is circumferential. There is no evidence of cardiac tamponade.  There is prominent epicardial fat present.   6. The mitral valve is degenerative. Trivial mitral valve regurgitation.  Severe mitral annular calcification.   7. The aortic valve is tricuspid. There is moderate calcification of the  aortic valve. There is moderate thickening of the aortic valve. Aortic  valve regurgitation is not visualized. Aortic valve  sclerosis/calcification is present, without any evidence  of aortic stenosis.   8. The inferior vena cava is dilated in size with >50% respiratory  variability, suggesting right atrial pressure of 8 mmHg.   Comparison(s): Compared to prior TTE, the pericardial effusion is now  moderate (previously trivial). Otherwise, there is no significant change.   Patient Profile     86 y.o. female with PMH of afib and h/o syncope. Patient was recently admitted in May with AMS and found to have acute intracranial hemorrhage with afib who has been admitted again with acute CHF and afib with RVR  Assessment & Plan    Persistent atrial fibrillation with RVR             - diagnosed in 2019, initially placed on Eliquis but stopped later and failed to return for follow up             - admitted in May with AMS due to R MCA large infarct with hemorrhagic transformation likely due to afib with  embolism. Placed on metoprolol and amiodarone. Cleared by neurology on discharge to  start Eliquis 2.5mg  BID 07/16/2021             - brought back to the hospital with respiratory distress and afib with RVR. Eliquis held due to AMS and acute anemia             - Digoxin added 7/19 to help with rate control. PO Amiodarone stopped due to new hypothyroidism.   - seems to be more confused this morning when compare to yesterday, still alert, did not respond to questions. HR borderline high, but not dangerous, unable to uptitrate rate control medication.    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 Appears to be near euvolemic. Diminished breath in the left base of the lung consistent with pleural effusion.    Anemia: hgb 8.7, stable. Improved from 6.5 on 7/18   AMS: mental status deteriorated compare to yesterday, did not respond to questions like yesterday.    Pericardial effusion: enlarging pericardial effusion on echo 7/19, consider repeat limited echo in 2-3 weeks.    Hypothyroidism: TSH 22. Amiodarone stopped. Management per primary team.   Hyponatremia: Na 125       For questions or updates, please contact [facility] Please consult [email or web address] for contact info under        Signed, Azalee Course, PA  08/29/2021, 8:57 AM    Patient seen and examined with Azalee Course PA.  Agree as above, with the following exceptions and changes as noted below. No CP or palpitations. Gen: drowsy, CV: iRRR, soft systolic murmur, Lungs: clear, Abd: soft, Extrem: Warm, 2+ puffy edema, Neuro/Psych: slow cognition, alert, oriented. All available labs, radiology testing, previous records reviewed. Pericardial effusion likely 2/2 to hypothyroidism, can repeat echo at follow up outpatient unless clinical change. Hyponatremia attributed to hypervolemia, diuresing and will follow Na.  Rates much better controlled on digoxin, rates 70s.   Parke Poisson, MD 08/29/21 1:56 PM

## 2021-08-29 NOTE — Progress Notes (Signed)
A&Ox3 (disoriented to time) this shift. Among other skin/wound details noted in physician notes, also noted large edematous area around right labia and right pelvic region. Ants found in patient bed, so patient transferred to a different, clean, available bed on unit.

## 2021-08-29 NOTE — Evaluation (Signed)
Physical Therapy Evaluation Patient Details Name: Gwendolyn [name] MRN: [id number] DOB: 1935/09/01 Today's Date: 08/29/2021  History of Present Illness  86 year old female with past medical history of persistent atrial fibrillation, hypertension, hyperlipidemia and recent right MCA stroke with hemorrhagic transformation 06/2021 (S/P PEG 5/30) who presents to [facility] emergency department from [place] [facility] assisted living facility via EMS with complaints of shortness of breath and peripheral edema.   Clinical Impression  Patient received in bed, she is sleeping but wakes to her name. Patient is a poor historian and answers "yes" to most questions. 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 Does not move R UE or LE much when directed to do so. [facility] and staff there report patient is hoyer lifted to wheelchair at baseline. Does not ambulate. She is at baseline level of function and does not require skilled PT follow up.  Signing off.             Recommendations for follow up therapy are one component of a multi-disciplinary discharge planning process, led by the attending physician.  Recommendations may be updated based on patient status, additional functional criteria and insurance authorization.  Follow Up Recommendations No PT follow up      Assistance Recommended at Discharge Frequent or constant Supervision/Assistance  Patient can return home with the following  A lot of help with walking and/or transfers;A lot of help with bathing/dressing/bathroom;Assistance with feeding    Equipment Recommendations None recommended by PT  Recommendations for Other Services       Functional Status Assessment Patient has not had a recent decline in their functional status     Precautions / Restrictions Precautions Precautions: Fall Restrictions Weight Bearing Restrictions: No      Mobility  Bed Mobility Overal  bed mobility: Needs Assistance Bed Mobility: Rolling Rolling: Total assist, +2 for physical assistance         General bed mobility comments: does not assist much at all with bed mobility. Limited responsiveness to direction. Appears to have significant L neglect. Head rotated to right and does not turn head when directed. Attempted to reposition head for comfort. Limted response.    Transfers                   General transfer comment: not able to attempt    Ambulation/Gait               General Gait Details: not able to attempt due to level of responsiveness and minimal mobility in bed  Stairs            Wheelchair Mobility    Modified Rankin (Stroke Patients Only)       Balance                                             Pertinent Vitals/Pain Pain Assessment Pain Assessment: Faces Pain Location: reports pain but unable to elaborate    Home Living Family/patient expects to be discharged to:: Skilled nursing facility                   Additional Comments: Patient from [facility]    Prior Function               Mobility Comments: unsure, patient mostly statues "yes" to questions,  cannot elaborate ADLs Comments: unsure     Hand Dominance        Extremity/Trunk Assessment   Upper Extremity Assessment Upper Extremity Assessment: LUE deficits/detail;Difficult to assess due to impaired cognition;RUE deficits/detail RUE Deficits / Details: Does not move when directed to do so LUE Deficits / Details: L UE weak due to prior CVA.    Lower Extremity Assessment Lower Extremity Assessment: LLE deficits/detail;RLE deficits/detail RLE Deficits / Details: Wiggled foot some, but does not move much LLE Deficits / Details: stiff and does not move in bed upon direction       Communication   Communication: Expressive difficulties  Cognition Arousal/Alertness: Awake/alert Behavior During Therapy: Flat  affect Overall Cognitive Status: Difficult to assess                                 General Comments: does not respond consistently when asking questions        General Comments      Exercises     Assessment/Plan    PT Assessment Patient does not need any further PT services  PT Problem List         PT Treatment Interventions      PT Goals (Current goals can be found in the Care Plan section)  Acute Rehab PT Goals Patient Stated Goal: unable to state PT Goal Formulation: Patient unable to participate in goal setting Time For Goal Achievement: 08/29/21    Frequency       Co-evaluation               AM-PAC PT "6 Clicks" Mobility  Outcome Measure Help needed turning from your back to your side while in a flat bed without using bedrails?: Total Help needed moving from lying on your back to sitting on the side of a flat bed without using bedrails?: Total Help needed moving to and from a bed to a chair (including a wheelchair)?: Total Help needed standing up from a chair using your arms (e.g., wheelchair or bedside chair)?: Total Help needed to walk in hospital room?: Total   6 Click Score: 5    End of Session   Activity Tolerance: Patient limited by fatigue;Other (comment) (poor level of arousal, responsiveness) Patient left: in bed;with call bell/phone within reach Nurse Communication: Mobility status      Time: 1432-1440 PT Time Calculation (min) (ACUTE ONLY): 8 min   Charges:   PT Evaluation $PT Eval Moderate Complexity: 1 Mod          Artez Regis, PT, GCS 08/29/21,3:04 PM

## 2021-08-29 NOTE — Progress Notes (Addendum)
[facility]  PROGRESS NOTE  Belvia Gotschall [facility]:[id number] DOB: [birth year] DOA: 08/26/2021 PCP: Irena Reichmann, DO   Brief HPI:    86 year old female with past medical history of atrial fibrillation, hypertension, hyperlipidemia, recent right MCA stroke with hemorrhagic transformation in May 2023, s/p PEG placement on 5/30 was brought to hospital from [place] living facility via EMS for shortness of breath and peripheral edema. In the ED she was found to be in A-fib with RVR and respiratory distress.  She was placed on BiPAP for increased work of breathing.  Due to volume overload patient was given IV Lasix. Patient was also found to be in metabolic encephalopathy.   Subjective   Patient seen and examined, denies chest pain or shortness of breath.  Answers questions intermittently.  Patient found to have suprapubic swelling this morning.  CT pelvis obtained which shows large right groin hernia with small bowel loops.  No complications noted.   Assessment/Plan:   Atrial fibrillation with RVR -Patient was on amiodarone which has been discontinued due to elevated TSH -Continue metoprolol 5 mg IV every 6 hours -Anticoagulation on hold due to anemia -Cardiology following  Hypothyroidism, new diagnosis -Likely in the setting of amiodarone use -Amiodarone has been discontinued per cardiology -Continue Synthroid 100 mcg daily  Large groin hernia -CT pelvis shows large right groin hernia containing small bowel loops -No complicating features noted -Spoke to general surgery PA, outpatient follow-up appointment will be scheduled for patient -No intervention required in the hospital  Constipation -Large rectal stool volume noted on CT pelvis -We will start MiraLAX 17 g twice daily  Metabolic encephalopathy -Likely multifactorial from hyponatremia, also found to have elevated TSH; 22 likely in the setting of amiodarone use -Amiodarone has been discontinued per  cardiology -Started on Synthroid 100 mcg daily  Normocytic anemia -Patient has chronic anemia -Hemoglobin dropped to 6.6  -S/p 1 unit PRBC -Hemoglobin has improved to 8.4  Acute diastolic CHF -Chest x-ray showed bilateral pleural effusion -Also chest x-ray concerning for pulmonary edema -Patient started on furosemide 40 mg IV every 12 hours -Net -1.4 L -Cardiology following  Left upper extremity swelling -No DVT noted on the vascular ultrasound -Findings consistent with indeterminate superficial vein thrombosis involving the left basilic vein   Hyponatremia -Likely from hypervolemic hyponatremia from underlying CHF -Sodium is still at 124 -Follow BMP in am   Medications     atorvastatin  40 mg Per Tube Daily   digoxin  0.25 mg Intravenous Daily   feeding supplement (OSMOLITE 1.5 CAL)  300 mL Per Tube TID PC & HS   free water  160 mL Per Tube TID PC & HS   furosemide  40 mg Intravenous Q12H   ipratropium-albuterol  3 mL Nebulization BID   levothyroxine  100 mcg Oral Q0600   metoprolol tartrate  5 mg Intravenous Q6H     Data Reviewed:   CBG:  Recent Labs  Lab 08/28/21 0822 08/28/21 1231 08/28/21 1812 08/29/21 0031 08/29/21 0412  GLUCAP 77 76 69* 218* 122*    SpO2: 100 % O2 Flow Rate (L/min): 3 L/min FiO2 (%): 40 %    Vitals:   08/29/21 0028 08/29/21 0403 08/29/21 0821 08/29/21 1115  BP: (!) 103/52 125/78  136/72  Pulse: (!) 106 93  (!) 102  Resp: 20 20  19   Temp: 98.4 F (36.9 C) 98.5 F (36.9 C)  98.7 F (37.1 C)  TempSrc: Oral Oral  Oral  SpO2: 94% 99% 99% 100%  Weight:  65.1 kg    Height:          Data Reviewed:  Basic Metabolic Panel: Recent Labs  Lab 08/27/21 0405 08/27/21 1240 08/27/21 2120 08/28/21 0216 08/29/21 0250  NA 123* 123* 125* 125* 124*  K 4.8 4.4 4.0 4.1 3.9  CL 83* 86* 90* 89* 91*  CO2 24 22 24 23 24   GLUCOSE 117* 98 84 79 175*  BUN 33* 30* 28* 29* 35*  CREATININE 0.99 0.90 0.89 0.94 0.95  CALCIUM 8.5* 8.1*  8.4* 8.4* 7.9*  MG 1.9  --   --   --   --     CBC: Recent Labs  Lab 08/26/21 1428 08/26/21 1445 08/27/21 0405 08/27/21 1240 08/27/21 2120 08/28/21 0216 08/29/21 0250  WBC 13.5*   < > 10.1 10.2 10.4 10.7* 9.8  NEUTROABS 11.0*  --   --   --   --   --   --   HGB 7.7*   < > 8.6* 8.6* 8.7* 8.7* 8.4*  HCT 24.0*   < > 25.7* 25.6* 26.2* 25.8* 25.4*  MCV 94.9   < > 91.5 89.8 88.8 89.0 90.1  PLT 272   < > 249 240 256 252 260   < > = values in this interval not displayed.    LFT Recent Labs  Lab 08/26/21 1428 08/29/21 0250  AST 86* 35  ALT 134* 70*  ALKPHOS 220* 145*  BILITOT 0.7 1.3*  PROT 7.0 5.6*  ALBUMIN 2.7* 2.1*     Antibiotics: Anti-infectives (From admission, onward)    None        DVT prophylaxis: SCDs  Code Status: Full code  Family Communication: No family at bedside   CONSULTS cardiology   Objective    Physical Examination:   General: Appears in no acute distress Cardiovascular: S1-S2, irregular Respiratory: Clear to auscultation bilaterally, no wheezing or crackles auscultated Abdomen: Abdomen is soft, nontender, no organomegaly Extremities: No edema in the lower extremities Neurologic: Alert oriented to self only, answers few questions appropriately   Status is: Inpatient: Atrial fibrillation with RVR, diastolic heart failure    Pressure Injury 07/03/21 Sacrum Medial Deep Tissue Pressure Injury - Purple or maroon localized area of discolored intact skin or blood-filled blister due to damage of underlying soft tissue from pressure and/or shear. (Active)  07/03/21 1642  Location: Sacrum  Location Orientation: Medial  Staging: Deep Tissue Pressure Injury - Purple or maroon localized area of discolored intact skin or blood-filled blister due to damage of underlying soft tissue from pressure and/or shear.  Wound Description (Comments):   Present on Admission: Yes        Joury Allcorn S Yamna Mackel   [facility] If 7PM-7AM, please contact  night-coverage at [email or web address], Office  [phone]   08/29/2021, 2:49 PM  LOS: 3 days

## 2021-08-29 NOTE — Care Management Important Message (Signed)
Important Message  Patient Details  Name: Gwendolyn [name] MRN: [id number] Date of Birth: 03/22/1935   Medicare Important Message Given:  Yes     Renie Ora 08/29/2021, 9:59 AM

## 2021-08-30 DIAGNOSIS — I501 Left ventricular failure: Secondary | ICD-10-CM | POA: Diagnosis not present

## 2021-08-30 DIAGNOSIS — G9341 Metabolic encephalopathy: Secondary | ICD-10-CM | POA: Diagnosis not present

## 2021-08-30 DIAGNOSIS — I5033 Acute on chronic diastolic (congestive) heart failure: Secondary | ICD-10-CM | POA: Diagnosis not present

## 2021-08-30 DIAGNOSIS — I4891 Unspecified atrial fibrillation: Secondary | ICD-10-CM | POA: Diagnosis not present

## 2021-08-30 LAB — CBC
HCT: 28.3 % — ABNORMAL LOW (ref 36.0–46.0)
Hemoglobin: 9.2 g/dL — ABNORMAL LOW (ref 12.0–15.0)
MCH: 29.9 pg (ref 26.0–34.0)
MCHC: 32.5 g/dL (ref 30.0–36.0)
MCV: 91.9 fL (ref 80.0–100.0)
Platelets: 249 10*3/uL (ref 150–400)
RBC: 3.08 MIL/uL — ABNORMAL LOW (ref 3.87–5.11)
RDW: 16.2 % — ABNORMAL HIGH (ref 11.5–15.5)
WBC: 9.9 10*3/uL (ref 4.0–10.5)
nRBC: 0 % (ref 0.0–0.2)

## 2021-08-30 LAB — GLUCOSE, CAPILLARY
Glucose-Capillary: 105 mg/dL — ABNORMAL HIGH (ref 70–99)
Glucose-Capillary: 111 mg/dL — ABNORMAL HIGH (ref 70–99)
Glucose-Capillary: 133 mg/dL — ABNORMAL HIGH (ref 70–99)
Glucose-Capillary: 177 mg/dL — ABNORMAL HIGH (ref 70–99)
Glucose-Capillary: 188 mg/dL — ABNORMAL HIGH (ref 70–99)
Glucose-Capillary: 205 mg/dL — ABNORMAL HIGH (ref 70–99)

## 2021-08-30 LAB — BASIC METABOLIC PANEL
Anion gap: 11 (ref 5–15)
BUN: 32 mg/dL — ABNORMAL HIGH (ref 8–23)
CO2: 25 mmol/L (ref 22–32)
Calcium: 7.3 mg/dL — ABNORMAL LOW (ref 8.9–10.3)
Chloride: 88 mmol/L — ABNORMAL LOW (ref 98–111)
Creatinine, Ser: 0.9 mg/dL (ref 0.44–1.00)
GFR, Estimated: >60 mL/min (ref >60)
Glucose, Bld: 173 mg/dL — ABNORMAL HIGH (ref 70–99)
Potassium: 4 mmol/L (ref 3.5–5.1)
Sodium: 124 mmol/L — ABNORMAL LOW (ref 135–145)

## 2021-08-30 MED ORDER — APIXABAN 2.5 MG PO TABS
2.5000 mg | ORAL_TABLET | Freq: Two times a day (BID) | ORAL | Status: DC
  Administered 2021-08-30 – 2021-08-31 (×2): 2.5 mg via ORAL
  Filled 2021-08-30 (×3): qty 1

## 2021-08-30 NOTE — Progress Notes (Signed)
Chart reviewed, patient not seen.  Continue digoxin, rates well controlled in atrial fibrillation.   Hb recovering, may want to consider revisiting anticoagulation.   Sodium remains low on diuresis. Diuresed appropriately yesterday with no change in renal function. Continue to monitor.  CV will follow as needed while patient remains in hospital.   Parke Poisson, MD 08/30/21 11:11 AM

## 2021-08-30 NOTE — Progress Notes (Addendum)
[facility]  PROGRESS NOTE  Lakea Mittelman [facility]:[id number] DOB: 1935-05-22 DOA: 08/26/2021 PCP: Irena Reichmann, DO   Brief HPI:    86 year old female with past medical history of atrial fibrillation, hypertension, hyperlipidemia, recent right MCA stroke with hemorrhagic transformation in May 2023, s/p PEG placement on 5/30 was brought to hospital from [place] living facility via EMS for shortness of breath and peripheral edema. In the ED she was found to be in A-fib with RVR and respiratory distress.  She was placed on BiPAP for increased work of breathing.  Due to volume overload patient was given IV Lasix. Patient was also found to be in metabolic encephalopathy.   Subjective   Patient seen and examined, no new complaints.   Assessment/Plan:   Atrial fibrillation with RVR -Patient was on amiodarone which has been discontinued due to elevated TSH -Continue digoxin 0.25 mg daily -Blood pressure soft, will hold metoprolol at this time -Patient was cleared by neurology to start back on Eliquis 2.5 twice daily on 07/16/2021 -Cardiology following; recommends to restart Eliquis -We will start Eliquis 2.5 mg p.o. twice daily  Hypothyroidism, new diagnosis -Likely in the setting of amiodarone use -Amiodarone has been discontinued per cardiology -Continue Synthroid 100 mcg daily  Large groin hernia -CT pelvis shows large right groin hernia containing small bowel loops -No complicating features noted -Spoke to general surgery PA, outpatient follow-up appointment will be scheduled for patient -No intervention required in the hospital  Constipation -Large rectal stool volume noted on CT pelvis -We will start MiraLAX 17 g twice daily  Metabolic encephalopathy -Likely multifactorial from hyponatremia, also found to have elevated TSH; 22 likely in the setting of amiodarone use -Amiodarone has been discontinued per cardiology -Started on Synthroid 100 mcg daily  Normocytic  anemia -Patient has chronic anemia -Hemoglobin dropped to 6.6  -S/p 1 unit PRBC -Hemoglobin has improved to 9.2 -Eliquis has been restarted, follow CBC in a.m.  Acute diastolic CHF -Chest x-ray showed bilateral pleural effusion -Also chest x-ray concerning for pulmonary edema -Patient started on furosemide 40 mg IV every 12 hours -Net -3.5 L -Cardiology following  Left upper extremity swelling -No DVT noted on the vascular ultrasound -Findings consistent with indeterminate superficial vein thrombosis involving the left basilic vein   Hyponatremia -Likely from hypervolemic hyponatremia from underlying CHF -Sodium is still at 124 -Follow BMP in am   Medications     apixaban  2.5 mg Oral BID   atorvastatin  40 mg Per Tube Daily   digoxin  0.25 mg Intravenous Daily   feeding supplement (OSMOLITE 1.5 CAL)  300 mL Per Tube TID PC & HS   free water  160 mL Per Tube TID PC & HS   furosemide  40 mg Intravenous Q12H   levothyroxine  100 mcg Oral Q0600   polyethylene glycol  17 g Oral BID     Data Reviewed:   CBG:  Recent Labs  Lab 08/29/21 1848 08/30/21 0047 08/30/21 0612 08/30/21 0852 08/30/21 1210  GLUCAP 167* 188* 111* 133* 205*    SpO2: 97 % O2 Flow Rate (L/min): 2 L/min FiO2 (%): 40 %    Vitals:   08/30/21 0536 08/30/21 1000 08/30/21 1007 08/30/21 1100  BP: 121/71   (!) 106/48  Pulse: 86  91 91  Resp: 20 20 18  (!) 21  Temp: 98.9 F (37.2 C)  (!) 97.5 F (36.4 C) 98.2 F (36.8 C)  TempSrc: Oral  Axillary Oral  SpO2: 100% 96%  97%  Weight: 63 kg     Height:          Data Reviewed:  Basic Metabolic Panel: Recent Labs  Lab 08/27/21 0405 08/27/21 1240 08/27/21 2120 08/28/21 0216 08/29/21 0250 08/30/21 0203  NA 123* 123* 125* 125* 124* 124*  K 4.8 4.4 4.0 4.1 3.9 4.0  CL 83* 86* 90* 89* 91* 88*  CO2 24 22 24 23 24 25   GLUCOSE 117* 98 84 79 175* 173*  BUN 33* 30* 28* 29* 35* 32*  CREATININE 0.99 0.90 0.89 0.94 0.95 0.90  CALCIUM 8.5* 8.1*  8.4* 8.4* 7.9* 7.3*  MG 1.9  --   --   --   --   --     CBC: Recent Labs  Lab 08/26/21 1428 08/26/21 1445 08/27/21 1240 08/27/21 2120 08/28/21 0216 08/29/21 0250 08/30/21 0203  WBC 13.5*   < > 10.2 10.4 10.7* 9.8 9.9  NEUTROABS 11.0*  --   --   --   --   --   --   HGB 7.7*   < > 8.6* 8.7* 8.7* 8.4* 9.2*  HCT 24.0*   < > 25.6* 26.2* 25.8* 25.4* 28.3*  MCV 94.9   < > 89.8 88.8 89.0 90.1 91.9  PLT 272   < > 240 256 252 260 249   < > = values in this interval not displayed.    LFT Recent Labs  Lab 08/26/21 1428 08/29/21 0250  AST 86* 35  ALT 134* 70*  ALKPHOS 220* 145*  BILITOT 0.7 1.3*  PROT 7.0 5.6*  ALBUMIN 2.7* 2.1*     Antibiotics: Anti-infectives (From admission, onward)    None        DVT prophylaxis: SCDs  Code Status: Full code  Family Communication: No family at bedside   CONSULTS cardiology   Objective    Physical Examination:  General-appears in no acute distress Heart-S1-S2, regular, no murmur auscultated Lungs-clear to auscultation bilaterally, no wheezing or crackles auscultated Abdomen-soft, nontender, no organomegaly Extremities-no edema in the lower extremities Neuro-alert, oriented to self and place only, answers questions intermittently   Status is: Inpatient: Atrial fibrillation with RVR, diastolic heart failure    Pressure Injury 07/03/21 Sacrum Medial Deep Tissue Pressure Injury - Purple or maroon localized area of discolored intact skin or blood-filled blister due to damage of underlying soft tissue from pressure and/or shear. (Active)  07/03/21 1642  Location: Sacrum  Location Orientation: Medial  Staging: Deep Tissue Pressure Injury - Purple or maroon localized area of discolored intact skin or blood-filled blister due to damage of underlying soft tissue from pressure and/or shear.  Wound Description (Comments):   Present on Admission: Yes        Scarlette Hogston S Ritik Stavola   [facility] If 7PM-7AM, please contact  night-coverage at [email or web address], Office  [phone]   08/30/2021, 3:12 PM  LOS: 4 days

## 2021-08-31 DIAGNOSIS — I501 Left ventricular failure: Secondary | ICD-10-CM | POA: Diagnosis not present

## 2021-08-31 DIAGNOSIS — I4891 Unspecified atrial fibrillation: Secondary | ICD-10-CM | POA: Diagnosis not present

## 2021-08-31 DIAGNOSIS — I5033 Acute on chronic diastolic (congestive) heart failure: Secondary | ICD-10-CM | POA: Diagnosis not present

## 2021-08-31 DIAGNOSIS — G9341 Metabolic encephalopathy: Secondary | ICD-10-CM | POA: Diagnosis not present

## 2021-08-31 LAB — CBC
HCT: 26.7 % — ABNORMAL LOW (ref 36.0–46.0)
Hemoglobin: 8.7 g/dL — ABNORMAL LOW (ref 12.0–15.0)
MCH: 30.2 pg (ref 26.0–34.0)
MCHC: 32.6 g/dL (ref 30.0–36.0)
MCV: 92.7 fL (ref 80.0–100.0)
Platelets: 233 10*3/uL (ref 150–400)
RBC: 2.88 MIL/uL — ABNORMAL LOW (ref 3.87–5.11)
RDW: 16 % — ABNORMAL HIGH (ref 11.5–15.5)
WBC: 8.6 10*3/uL (ref 4.0–10.5)
nRBC: 0 % (ref 0.0–0.2)

## 2021-08-31 LAB — CULTURE, BLOOD (ROUTINE X 2)
Culture: NO GROWTH
Special Requests: ADEQUATE

## 2021-08-31 LAB — GLUCOSE, CAPILLARY
Glucose-Capillary: 129 mg/dL — ABNORMAL HIGH (ref 70–99)
Glucose-Capillary: 156 mg/dL — ABNORMAL HIGH (ref 70–99)
Glucose-Capillary: 161 mg/dL — ABNORMAL HIGH (ref 70–99)
Glucose-Capillary: 221 mg/dL — ABNORMAL HIGH (ref 70–99)

## 2021-08-31 LAB — BASIC METABOLIC PANEL
Anion gap: 9 (ref 5–15)
BUN: 36 mg/dL — ABNORMAL HIGH (ref 8–23)
CO2: 28 mmol/L (ref 22–32)
Calcium: 7.6 mg/dL — ABNORMAL LOW (ref 8.9–10.3)
Chloride: 90 mmol/L — ABNORMAL LOW (ref 98–111)
Creatinine, Ser: 0.98 mg/dL (ref 0.44–1.00)
GFR, Estimated: 57 mL/min — ABNORMAL LOW (ref >60)
Glucose, Bld: 175 mg/dL — ABNORMAL HIGH (ref 70–99)
Potassium: 4.6 mmol/L (ref 3.5–5.1)
Sodium: 127 mmol/L — ABNORMAL LOW (ref 135–145)

## 2021-08-31 MED ORDER — POLYETHYLENE GLYCOL 3350 17 G PO PACK: 17.0000 g | PACK | Freq: Two times a day (BID) | ORAL | Status: AC

## 2021-08-31 MED ORDER — OXYCODONE HCL 5 MG PO TABS
5.0000 mg | ORAL_TABLET | Freq: Four times a day (QID) | ORAL | Status: DC | PRN
  Administered 2021-09-06 – 2021-09-07 (×2): 5 mg
  Filled 2021-08-31 (×2): qty 1

## 2021-08-31 MED ORDER — APIXABAN 2.5 MG PO TABS
2.5000 mg | ORAL_TABLET | Freq: Two times a day (BID) | ORAL | Status: DC
  Administered 2021-08-31 – 2021-09-09 (×18): 2.5 mg
  Filled 2021-08-31 (×18): qty 1

## 2021-08-31 MED ORDER — ONDANSETRON HCL 4 MG PO TABS: 4.0000 mg | ORAL_TABLET | Freq: Four times a day (QID) | ORAL | Status: DC | PRN

## 2021-08-31 MED ORDER — LEVOTHYROXINE SODIUM 100 MCG/5ML IV SOLN
50.0000 ug | Freq: Every day | INTRAVENOUS | Status: DC
  Administered 2021-09-01: 50 ug via INTRAVENOUS
  Filled 2021-08-31: qty 5

## 2021-08-31 MED ORDER — CIPROFLOXACIN HCL 500 MG PO TABS
250.0000 mg | ORAL_TABLET | Freq: Two times a day (BID) | ORAL | Status: DC
  Administered 2021-08-31: 250 mg via ORAL
  Filled 2021-08-31: qty 1

## 2021-08-31 MED ORDER — ONDANSETRON HCL 4 MG/2ML IJ SOLN: 4.0000 mg | Freq: Four times a day (QID) | INTRAMUSCULAR | Status: DC | PRN

## 2021-08-31 MED ORDER — CIPROFLOXACIN IN D5W 200 MG/100ML IV SOLN
200.0000 mg | Freq: Two times a day (BID) | INTRAVENOUS | Status: DC
  Administered 2021-09-01: 200 mg via INTRAVENOUS
  Filled 2021-08-31 (×2): qty 100

## 2021-08-31 MED ORDER — OXYCODONE HCL 5 MG PO TABS
5.0000 mg | ORAL_TABLET | Freq: Four times a day (QID) | ORAL | Status: DC | PRN
  Administered 2021-08-31: 5 mg via ORAL
  Filled 2021-08-31: qty 1

## 2021-08-31 NOTE — Consult Note (Signed)
WOC Nurse Consult Note: Reason for Consult:right ear pressure injury Wound type:pressure Pressure Injury POA: Yes Measurement:Per Nursing Flow Sheet, 3cm x 1.5cm x 0.1cm Wound bed:red, moist Drainage (amount, consistency, odor) Moderate serosanguinous per Dr. Daune Perch note today Periwound: erythematous Dressing procedure/placement/frequency: I have provided Nursing with guidance for an antimicrobial nonadherent (xeroform) dressing to be applied today and daily, topped with dry gauze and securement is either with paper tape or silicone foam (silicone foam will provide some padding). Prevalon Boots are provided as well as a mattress replacement as positioning her head off of the right ear is very difficult. Use f a folded towel beneath the proximal head with the auricle on the low air loss mattress may help. It is recommended that a DermaTherapy pillow case be placed under the ear.  Discussed POC with Bedside RN L. Alverez.  WOC nursing team will not follow, but will remain available to this patient, the nursing and medical teams.  Please re-consult if needed.  Thank you for inviting [place] to participate in this patient's Plan of Care.  Ladona Mow, MSN, RN, CNS, GNP, Leda Min, [employer], [employer] phone:  [phone]

## 2021-08-31 NOTE — Progress Notes (Signed)
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 In the ED she was found to be in A-fib with RVR and respiratory distress.  She was placed on BiPAP for increased work of breathing.  Due to volume overload patient was given IV Lasix. Patient was also found to be in metabolic encephalopathy.   Subjective   Patient seen, more talkative today.  Complains of pain in right ear   Assessment/Plan:   Atrial fibrillation with RVR -Patient was on amiodarone which has been discontinued due to elevated TSH -Continue digoxin 0.25 mg daily -Blood pressure soft, will hold metoprolol at this time -Patient was cleared by neurology to start back on Eliquis 2.5 twice daily on 07/16/2021 -Cardiology following; recommends to restart Eliquis -We will start Eliquis 2.5 mg p.o. twice daily  Right pinna perichondritis -Patient keeps her head on right side on pillow -Has been a lesion likely from shearing stress on the skin of right ear -Dressing in place -She has significant serosanguineous discharge and erythema in right ear pinna -We will start ciprofloxacin to 50 mg p.o. twice daily for 3 days -We will also consult wound care RN for topical wound care  Hypothyroidism, new diagnosis -Likely in the setting of amiodarone use -Amiodarone has been discontinued per cardiology -Continue Synthroid 100 mcg daily  Large groin hernia -CT pelvis shows large right groin hernia containing small bowel loops -No complicating features noted -Spoke to general surgery PA, outpatient follow-up appointment will be scheduled for  patient -No intervention required in the hospital  Constipation -Large rectal stool volume noted on CT pelvis -We will start MiraLAX 17 g twice daily  Metabolic encephalopathy -Likely multifactorial from hyponatremia, also found to have elevated TSH; 22 likely in the setting of amiodarone use -Significantly improved since starting Synthroid -Amiodarone has been discontinued per cardiology -Started on Synthroid 100 mcg daily  Normocytic anemia -Patient has chronic anemia -Hemoglobin dropped to 6.6  -[name]/p 1 unit PRBC -Hemoglobin has improved to 9.2 -Eliquis has been restarted, follow CBC in a.m.  Acute diastolic CHF -Chest x-ray showed bilateral pleural effusion -Also chest x-ray concerning for pulmonary edema -Patient started on furosemide 40 mg IV every 12 hours -Net -4.2 L -Cardiology following  Left upper extremity swelling -No DVT noted on the vascular ultrasound -Findings consistent with indeterminate superficial vein thrombosis involving the left basilic vein   Hyponatremia -Likely from hypervolemic hyponatremia from underlying CHF -Sodium has improved to 127 -Follow BMP in am   Medications     apixaban  2.5 mg Oral BID   atorvastatin  40 mg Per Tube Daily   ciprofloxacin  250 mg Oral BID   digoxin  0.25 mg Intravenous Daily   feeding supplement (OSMOLITE 1.5 CAL)  300 mL Per Tube TID PC & HS   free water  160 mL Per Tube TID PC & HS   furosemide  40 mg Intravenous Q12H   levothyroxine  100 mcg Oral Q0600   polyethylene glycol  17 g Oral BID     Data Reviewed:   CBG:  Recent Labs  Lab 08/30/21 1210 08/30/21 1553 08/30/21 2004 08/31/21 0011 08/31/21 0443  GLUCAP 205*  177* 105* 221* 129*    SpO2: 100 % O2 Flow Rate (L/min): 2 L/min FiO2 (%): 40 %    Vitals:   08/30/21 2006 08/31/21 0012 08/31/21 0444 08/31/21 0847  BP: 126/69 132/79 129/74 (!) 159/76  Pulse: 97 85 88 93  Resp: 19 19 20 20   Temp: 99.1 F (37.3 C) 98.8 F (37.1 C) 98.5 F  (36.9 C)   TempSrc: Oral Oral Oral   SpO2: 99% 99% 100% 100%  Weight:   64.4 kg   Height:          Data Reviewed:  Basic Metabolic Panel: Recent Labs  Lab 08/27/21 0405 08/27/21 1240 08/27/21 2120 08/28/21 0216 08/29/21 0250 08/30/21 0203 08/31/21 0139  NA 123*   < > 125* 125* 124* 124* 127*  K 4.8   < > 4.0 4.1 3.9 4.0 4.6  CL 83*   < > 90* 89* 91* 88* 90*  CO2 24   < > 24 23 24 25 28   GLUCOSE 117*   < > 84 79 175* 173* 175*  BUN 33*   < > 28* 29* 35* 32* 36*  CREATININE 0.99   < > 0.89 0.94 0.95 0.90 0.98  CALCIUM 8.5*   < > 8.4* 8.4* 7.9* 7.3* 7.6*  MG 1.9  --   --   --   --   --   --    < > = values in this interval not displayed.    CBC: Recent Labs  Lab 08/26/21 1428 08/26/21 1445 08/27/21 2120 08/28/21 0216 08/29/21 0250 08/30/21 0203 08/31/21 0139  WBC 13.5*   < > 10.4 10.7* 9.8 9.9 8.6  NEUTROABS 11.0*  --   --   --   --   --   --   HGB 7.7*   < > 8.7* 8.7* 8.4* 9.2* 8.7*  HCT 24.0*   < > 26.2* 25.8* 25.4* 28.3* 26.7*  MCV 94.9   < > 88.8 89.0 90.1 91.9 92.7  PLT 272   < > 256 252 260 249 233   < > = values in this interval not displayed.    LFT Recent Labs  Lab 08/26/21 1428 08/29/21 0250  AST 86* 35  ALT 134* 70*  ALKPHOS 220* 145*  BILITOT 0.7 1.3*  PROT 7.0 5.6*  ALBUMIN 2.7* 2.1*     Antibiotics: Anti-infectives (From admission, onward)    Start     Dose/Rate Route Frequency Ordered Stop   08/31/21 1245  ciprofloxacin (CIPRO) tablet 250 mg        250 mg Oral 2 times daily 08/31/21 1146 09/03/21 0759        DVT prophylaxis: SCDs  Code Status: Full code  Family Communication: No family at bedside   CONSULTS cardiology   Objective    Physical Examination:  General-appears in no acute distress Heart-S1-S2, regular, no murmur auscultated Lungs-clear to auscultation bilaterally, no wheezing or crackles auscultated Abdomen-soft, nontender, no organomegaly Extremities-no edema in the lower extremities Neuro-alert,  oriented x3, no focal deficit noted HEENT-right ear pinna has lesion with marked erythema, tender to palpation.  Your canal has wax.  Tympanic membrane not visible.   Status is: Inpatient: Atrial fibrillation with RVR, diastolic heart failure    Pressure Injury 07/03/21 Sacrum Medial Deep Tissue Pressure Injury - Purple or maroon localized area of discolored intact skin or blood-filled blister due to damage of underlying soft tissue from pressure and/or shear. (Active)  07/03/21 1642  Location: Sacrum  Location  Orientation: Medial  Staging: Deep Tissue Pressure Injury - Purple or maroon localized area of discolored intact skin or blood-filled blister due to damage of underlying soft tissue from pressure and/or shear.  Wound Description (Comments):   Present on Admission: Yes        Gwendolyn [name] Koby Hartfield   [facility] If 7PM-7AM, please contact night-coverage at [email or web address], Office  [phone]   08/31/2021, 11:46 AM  LOS: 5 days

## 2021-09-01 DIAGNOSIS — I4891 Unspecified atrial fibrillation: Secondary | ICD-10-CM | POA: Diagnosis not present

## 2021-09-01 DIAGNOSIS — I501 Left ventricular failure: Secondary | ICD-10-CM | POA: Diagnosis not present

## 2021-09-01 DIAGNOSIS — G9341 Metabolic encephalopathy: Secondary | ICD-10-CM | POA: Diagnosis not present

## 2021-09-01 DIAGNOSIS — I5033 Acute on chronic diastolic (congestive) heart failure: Secondary | ICD-10-CM | POA: Diagnosis not present

## 2021-09-01 LAB — CBC
HCT: 28.1 % — ABNORMAL LOW (ref 36.0–46.0)
Hemoglobin: 9.1 g/dL — ABNORMAL LOW (ref 12.0–15.0)
MCH: 29.7 pg (ref 26.0–34.0)
MCHC: 32.4 g/dL (ref 30.0–36.0)
MCV: 91.8 fL (ref 80.0–100.0)
Platelets: 246 10*3/uL (ref 150–400)
RBC: 3.06 MIL/uL — ABNORMAL LOW (ref 3.87–5.11)
RDW: 15.9 % — ABNORMAL HIGH (ref 11.5–15.5)
WBC: 8.8 10*3/uL (ref 4.0–10.5)
nRBC: 0 % (ref 0.0–0.2)

## 2021-09-01 LAB — BASIC METABOLIC PANEL
Anion gap: 7 (ref 5–15)
BUN: 31 mg/dL — ABNORMAL HIGH (ref 8–23)
CO2: 29 mmol/L (ref 22–32)
Calcium: 7.8 mg/dL — ABNORMAL LOW (ref 8.9–10.3)
Chloride: 90 mmol/L — ABNORMAL LOW (ref 98–111)
Creatinine, Ser: 0.8 mg/dL (ref 0.44–1.00)
GFR, Estimated: >60 mL/min (ref >60)
Glucose, Bld: 123 mg/dL — ABNORMAL HIGH (ref 70–99)
Potassium: 4.9 mmol/L (ref 3.5–5.1)
Sodium: 126 mmol/L — ABNORMAL LOW (ref 135–145)

## 2021-09-01 LAB — GLUCOSE, CAPILLARY
Glucose-Capillary: 124 mg/dL — ABNORMAL HIGH (ref 70–99)
Glucose-Capillary: 151 mg/dL — ABNORMAL HIGH (ref 70–99)
Glucose-Capillary: 151 mg/dL — ABNORMAL HIGH (ref 70–99)
Glucose-Capillary: 152 mg/dL — ABNORMAL HIGH (ref 70–99)
Glucose-Capillary: 157 mg/dL — ABNORMAL HIGH (ref 70–99)
Glucose-Capillary: 188 mg/dL — ABNORMAL HIGH (ref 70–99)

## 2021-09-01 MED ORDER — FUROSEMIDE 40 MG PO TABS
40.0000 mg | ORAL_TABLET | Freq: Two times a day (BID) | ORAL | Status: DC
  Administered 2021-09-01 – 2021-09-03 (×4): 40 mg via ORAL
  Filled 2021-09-01 (×4): qty 1

## 2021-09-01 MED ORDER — DIGOXIN 250 MCG PO TABS: 0.2500 mg | ORAL_TABLET | Freq: Every day | ORAL | Status: DC

## 2021-09-01 MED ORDER — LEVOTHYROXINE SODIUM 100 MCG PO TABS
100.0000 ug | ORAL_TABLET | Freq: Every day | ORAL | Status: DC
Start: 2021-09-02 — End: 2021-09-09
  Administered 2021-09-02 – 2021-09-09 (×8): 100 ug
  Filled 2021-09-01 (×8): qty 1

## 2021-09-01 MED ORDER — METOPROLOL TARTRATE 25 MG/10 ML ORAL SUSPENSION
12.5000 mg | Freq: Two times a day (BID) | ORAL | Status: DC
  Administered 2021-09-01 – 2021-09-02 (×3): 12.5 mg
  Filled 2021-09-01 (×4): qty 5

## 2021-09-01 MED ORDER — CIPROFLOXACIN IN D5W 400 MG/200ML IV SOLN
400.0000 mg | Freq: Two times a day (BID) | INTRAVENOUS | Status: AC
  Administered 2021-09-01 – 2021-09-03 (×4): 400 mg via INTRAVENOUS
  Filled 2021-09-01 (×4): qty 200

## 2021-09-01 NOTE — Progress Notes (Signed)
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 In the ED she was found to be in A-fib with RVR and respiratory distress.  She was placed on BiPAP for increased work of breathing.  Due to volume overload patient was given IV Lasix. Patient was also found to be in metabolic encephalopathy.   Subjective   Patient seen and examined, she is talking better.   Assessment/Plan:   Atrial fibrillation with RVR -Patient was on amiodarone which has been discontinued due to elevated TSH -Continue digoxin 0.25 mg daily -Patient was cleared by neurology to start back on Eliquis 2.5 twice daily on 07/16/2021 -Cardiology following; recommends to restart Eliquis -Patient started back on Eliquis 2.5 mg p.o. twice daily  Hypertension -Blood pressure is now elevated -We will restart metoprolol at low-dose of 12.5 mg p.o. twice daily.   -She was on 12.5 mg every 8 hours at the skilled facility  Right pinna perichondritis -Patient keeps her head on right side on pillow -Has been a lesion likely from shearing stress on the skin of right ear -Dressing in place -She has significant serosanguineous discharge and erythema in right ear pinna -Started on ciprofloxacin 400 mg IV  twice daily for 3 days -Wound care RN consulted  Hypothyroidism, new diagnosis -Likely in the setting of amiodarone use -Amiodarone has been discontinued per cardiology -Continue Synthroid 100 mcg daily  Large groin hernia -CT pelvis shows large right groin hernia containing small bowel loops -No complicating features noted -Spoke to  general surgery PA, outpatient follow-up appointment will be scheduled for patient -No intervention required in the hospital  Constipation -Large rectal stool volume noted on CT pelvis -We will start MiraLAX 17 g twice daily  Metabolic encephalopathy -Likely multifactorial from hyponatremia, also found to have elevated TSH; 22 likely in the setting of amiodarone use -Significantly improved since starting Synthroid -Amiodarone has been discontinued per cardiology -Started on Synthroid 100 mcg daily  Normocytic anemia -Patient has chronic anemia -Hemoglobin dropped to 6.6  -S/p 1 unit PRBC -Hemoglobin has improved to 9.2 -Eliquis has been restarted, follow CBC in a.m.  Acute diastolic CHF -Chest x-ray showed bilateral pleural effusion -Also chest x-ray concerning for pulmonary edema -Patient started on furosemide 40 mg IV every 12 hours -Net -5.05 L -I will switch her to Lasix 40 mg p.o. twice daily  Left upper extremity swelling -No DVT noted on the vascular ultrasound -Findings consistent with indeterminate superficial vein thrombosis involving the left basilic vein   Hyponatremia -Likely from hypervolemic hyponatremia from underlying CHF -Sodium is 126 today -Follow BMP in am   Medications     apixaban  2.5 mg Per Tube BID   atorvastatin  40 mg Per Tube Daily   [START ON 09/02/2021] digoxin  0.25 mg Per Tube Daily   feeding supplement (OSMOLITE 1.5 CAL)  300 mL Per Tube TID PC & HS   free water  160 mL Per Tube TID PC & HS   furosemide  40 mg Intravenous Q12H   [START ON 09/02/2021] levothyroxine  100 mcg Per Tube Q0600   metoprolol tartrate  12.5 mg Per Tube BID  Data Reviewed:   CBG:  Recent Labs  Lab 08/31/21 2027 09/01/21 0002 09/01/21 0356 09/01/21 0743 09/01/21 1155  GLUCAP 156* 188* 151* 124* 152*    SpO2: 100 % O2 Flow Rate (L/min): 6 L/min FiO2 (%): 40 %    Vitals:   09/01/21 0358 09/01/21 1349 09/01/21 1350 09/01/21 1404  BP: (!)  157/84 (!) 162/95 (!) 162/95   Pulse: 100 (!) 114 (!) 114   Resp: 20 (!) 22 (!) 22   Temp: 98.8 F (37.1 C) 98.6 F (37 C) 98.6 F (37 C)   TempSrc: Oral Axillary    SpO2: 98% 100%  100%  Weight: 68.3 kg     Height:          Data Reviewed:  Basic Metabolic Panel: Recent Labs  Lab 08/27/21 0405 08/27/21 1240 08/28/21 0216 08/29/21 0250 08/30/21 0203 08/31/21 0139 09/01/21 0439  NA 123*   < > 125* 124* 124* 127* 126*  K 4.8   < > 4.1 3.9 4.0 4.6 4.9  CL 83*   < > 89* 91* 88* 90* 90*  CO2 24   < > 23 24 25 28 29   GLUCOSE 117*   < > 79 175* 173* 175* 123*  BUN 33*   < > 29* 35* 32* 36* 31*  CREATININE 0.99   < > 0.94 0.95 0.90 0.98 0.80  CALCIUM 8.5*   < > 8.4* 7.9* 7.3* 7.6* 7.8*  MG 1.9  --   --   --   --   --   --    < > = values in this interval not displayed.    CBC: Recent Labs  Lab 08/26/21 1428 08/26/21 1445 08/28/21 0216 08/29/21 0250 08/30/21 0203 08/31/21 0139 09/01/21 0439  WBC 13.5*   < > 10.7* 9.8 9.9 8.6 8.8  NEUTROABS 11.0*  --   --   --   --   --   --   HGB 7.7*   < > 8.7* 8.4* 9.2* 8.7* 9.1*  HCT 24.0*   < > 25.8* 25.4* 28.3* 26.7* 28.1*  MCV 94.9   < > 89.0 90.1 91.9 92.7 91.8  PLT 272   < > 252 260 249 233 246   < > = values in this interval not displayed.    LFT Recent Labs  Lab 08/26/21 1428 08/29/21 0250  AST 86* 35  ALT 134* 70*  ALKPHOS 220* 145*  BILITOT 0.7 1.3*  PROT 7.0 5.6*  ALBUMIN 2.7* 2.1*     Antibiotics: Anti-infectives (From admission, onward)    Start     Dose/Rate Route Frequency Ordered Stop   09/01/21 1700  ciprofloxacin (CIPRO) IVPB 400 mg        400 mg 200 mL/hr over 60 Minutes Intravenous Every 12 hours 09/01/21 1230 09/03/21 1659   09/01/21 0500  ciprofloxacin (CIPRO) IVPB 200 mg  Status:  Discontinued        200 mg 100 mL/hr over 60 Minutes Intravenous Every 12 hours 08/31/21 2045 09/01/21 1230   08/31/21 1245  ciprofloxacin (CIPRO) tablet 250 mg  Status:  Discontinued        250 mg Oral 2 times  daily 08/31/21 1146 08/31/21 2045        DVT prophylaxis: SCDs  Code Status: Full code  Family Communication: No family at bedside   CONSULTS cardiology   Objective    Physical Examination:  General-appears in no acute distress Heart-S1-S2, regular, no murmur auscultated Lungs-clear to auscultation  bilaterally, no wheezing or crackles auscultated Abdomen-soft, nontender, no organomegaly Extremities-no edema in the lower extremities Neuro-alert, oriented x3, no focal deficit noted HEENT-right ear pinna in dressing   Status is: Inpatient: Atrial fibrillation with RVR, diastolic heart failure    Pressure Injury 07/03/21 Sacrum Medial Deep Tissue Pressure Injury - Purple or maroon localized area of discolored intact skin or blood-filled blister due to damage of underlying soft tissue from pressure and/or shear. (Active)  07/03/21 1642  Location: Sacrum  Location Orientation: Medial  Staging: Deep Tissue Pressure Injury - Purple or maroon localized area of discolored intact skin or blood-filled blister due to damage of underlying soft tissue from pressure and/or shear.  Wound Description (Comments):   Present on Admission: Yes     Pressure Injury 08/31/21 Ear Right;Mid Stage 3 -  Full thickness tissue loss. Subcutaneous fat may be visible but bone, tendon or muscle are NOT exposed. (Active)  08/31/21 1400  Location: Ear  Location Orientation: Right;Mid  Staging: Stage 3 -  Full thickness tissue loss. Subcutaneous fat may be visible but bone, tendon or muscle are NOT exposed.  Wound Description (Comments):   Present on Admission: No        Nickey Canedo S Skii Cleland   [facility] If 7PM-7AM, please contact night-coverage at [email or web address], Office  [phone]   09/01/2021, 2:49 PM  LOS: 6 days

## 2021-09-02 DIAGNOSIS — I4891 Unspecified atrial fibrillation: Secondary | ICD-10-CM | POA: Diagnosis not present

## 2021-09-02 DIAGNOSIS — I5033 Acute on chronic diastolic (congestive) heart failure: Secondary | ICD-10-CM | POA: Diagnosis not present

## 2021-09-02 DIAGNOSIS — I501 Left ventricular failure: Secondary | ICD-10-CM | POA: Diagnosis not present

## 2021-09-02 DIAGNOSIS — G9341 Metabolic encephalopathy: Secondary | ICD-10-CM | POA: Diagnosis not present

## 2021-09-02 LAB — BASIC METABOLIC PANEL
Anion gap: 9 (ref 5–15)
BUN: 31 mg/dL — ABNORMAL HIGH (ref 8–23)
CO2: 28 mmol/L (ref 22–32)
Calcium: 7.9 mg/dL — ABNORMAL LOW (ref 8.9–10.3)
Chloride: 88 mmol/L — ABNORMAL LOW (ref 98–111)
Creatinine, Ser: 0.9 mg/dL (ref 0.44–1.00)
GFR, Estimated: >60 mL/min (ref >60)
Glucose, Bld: 168 mg/dL — ABNORMAL HIGH (ref 70–99)
Potassium: 5.1 mmol/L (ref 3.5–5.1)
Sodium: 125 mmol/L — ABNORMAL LOW (ref 135–145)

## 2021-09-02 LAB — DIGOXIN LEVEL: Digoxin Level: 3 ng/mL (ref 0.8–2.0)

## 2021-09-02 LAB — GLUCOSE, CAPILLARY
Glucose-Capillary: 123 mg/dL — ABNORMAL HIGH (ref 70–99)
Glucose-Capillary: 141 mg/dL — ABNORMAL HIGH (ref 70–99)
Glucose-Capillary: 146 mg/dL — ABNORMAL HIGH (ref 70–99)
Glucose-Capillary: 149 mg/dL — ABNORMAL HIGH (ref 70–99)
Glucose-Capillary: 161 mg/dL — ABNORMAL HIGH (ref 70–99)
Glucose-Capillary: 166 mg/dL — ABNORMAL HIGH (ref 70–99)
Glucose-Capillary: 184 mg/dL — ABNORMAL HIGH (ref 70–99)

## 2021-09-02 MED ORDER — LEVOTHYROXINE SODIUM 100 MCG PO TABS: 100.0000 ug | ORAL_TABLET | Freq: Every day | ORAL | 11 refills | Status: AC

## 2021-09-02 MED ORDER — METOPROLOL TARTRATE 25 MG/10 ML ORAL SUSPENSION
12.5000 mg | Freq: Four times a day (QID) | ORAL | Status: DC
  Administered 2021-09-02 – 2021-09-03 (×3): 12.5 mg
  Filled 2021-09-02 (×4): qty 5

## 2021-09-02 NOTE — Consult Note (Signed)
   [facility] Inpatient Consult   09/02/2021  Lakhia Gengler 01/14/1936 [id number]  [employer] [ACO] Patient: Medicare ACO REACH  Primary Care Provider:  Janie Morning, DO, is a provider at [facility], an [place] provider with [employer]  Review notes patient is from [facility], [place] facility.   Plan: Needs are to be met at facility for post hospital transition.  Natividad Brood, RN BSN [facility] Liaison  [phone] business mobile phone Toll free office [phone]  Fax number: [phone] [email or web address]

## 2021-09-02 NOTE — Progress Notes (Signed)
Cardiology Progress Note  Patient ID: Gwendolyn [name] MRN: [id number] DOB: 1935-11-28 Date of Encounter: 09/02/2021  Primary Cardiologist: Chrystie Nose, MD  Subjective   Chief Complaint: Shortness of breath  HPI: Cardiology reconsulted for atrial fibrillation on digoxin.  Digoxin level elevated.  Appears to be in a slow atrial flutter which I suspect is related to digoxin.  We will stop this.  ROS:  All other ROS reviewed and negative. Pertinent positives noted in the HPI.     Inpatient Medications  Scheduled Meds:  apixaban  2.5 mg Per Tube BID   atorvastatin  40 mg Per Tube Daily   feeding supplement (OSMOLITE 1.5 CAL)  300 mL Per Tube TID PC & HS   free water  160 mL Per Tube TID PC & HS   furosemide  40 mg Oral BID   levothyroxine  100 mcg Per Tube Q0600   metoprolol tartrate  12.5 mg Per Tube BID   Continuous Infusions:  ciprofloxacin 400 mg (09/02/21 0514)   PRN Meds: acetaminophen **OR** acetaminophen, ipratropium-albuterol, ondansetron **OR** ondansetron (ZOFRAN) IV, oxyCODONE   Vital Signs   Vitals:   09/01/21 2134 09/01/21 2205 09/02/21 0400 09/02/21 0405  BP:   (!) 154/102 (!) 179/87  Pulse: (!) 29 (!) 59 97 94  Resp: (!) 27 (!) 26 (!) 24 (!) 25  Temp:    98.1 F (36.7 C)  TempSrc:    Oral  SpO2: 98% 100% (!) 77% 94%  Weight:      Height:        Intake/Output Summary (Last 24 hours) at 09/02/2021 1116 Last data filed at 09/02/2021 0403 Gross per 24 hour  Intake 499.86 ml  Output 1050 ml  Net -550.14 ml      09/01/2021    3:58 AM 08/31/2021    4:44 AM 08/30/2021    5:36 AM  Last 3 Weights  Weight (lbs) 150 lb 9.2 oz 141 lb 15.6 oz 138 lb 14.2 oz  Weight (kg) 68.3 kg 64.4 kg 63 kg      Telemetry  Overnight telemetry shows organized atrial fibrillation versus flutter, which I personally reviewed.   ECG  The most recent ECG shows atrial flutter heart rate 96, which I personally reviewed.   Physical Exam   Vitals:   09/01/21 2134 09/01/21  2205 09/02/21 0400 09/02/21 0405  BP:   (!) 154/102 (!) 179/87  Pulse: (!) 29 (!) 59 97 94  Resp: (!) 27 (!) 26 (!) 24 (!) 25  Temp:    98.1 F (36.7 C)  TempSrc:    Oral  SpO2: 98% 100% (!) 77% 94%  Weight:      Height:        Intake/Output Summary (Last 24 hours) at 09/02/2021 1116 Last data filed at 09/02/2021 0403 Gross per 24 hour  Intake 499.86 ml  Output 1050 ml  Net -550.14 ml       09/01/2021    3:58 AM 08/31/2021    4:44 AM 08/30/2021    5:36 AM  Last 3 Weights  Weight (lbs) 150 lb 9.2 oz 141 lb 15.6 oz 138 lb 14.2 oz  Weight (kg) 68.3 kg 64.4 kg 63 kg    Body mass index is 28.45 kg/m.   General: Ill-appearing Head: Atraumatic, normal size  Eyes: PEERLA, EOMI  Neck: Supple, no JVD Endocrine: No thryomegaly Cardiac: Normal S1, S2; tachycardia, no murmurs Lungs: Diminished breath sounds bilaterally Abd: Soft, nontender, no hepatomegaly  Ext: No edema, warm extremities  Musculoskeletal: No deformities Skin: Warm and dry, no rashes   Neuro: Alert awake, oriented to person place and time  Labs  High Sensitivity Troponin:   Recent Labs  Lab 08/26/21 1428 08/26/21 1700  TROPONINIHS 24* 22*     Cardiac EnzymesNo results for input(s): "TROPONINI" in the last 168 hours. No results for input(s): "TROPIPOC" in the last 168 hours.  Chemistry Recent Labs  Lab 08/26/21 1428 08/26/21 1445 08/29/21 0250 08/30/21 0203 08/31/21 0139 09/01/21 0439 09/02/21 0200  NA 119*   < > 124*   < > 127* 126* 125*  K 4.8   < > 3.9   < > 4.6 4.9 5.1  CL 84*   < > 91*   < > 90* 90* 88*  CO2 21*   < > 24   < > 28 29 28   GLUCOSE 119*   < > 175*   < > 175* 123* 168*  BUN 37*   < > 35*   < > 36* 31* 31*  CREATININE 0.81   < > 0.95   < > 0.98 0.80 0.90  CALCIUM 8.3*   < > 7.9*   < > 7.6* 7.8* 7.9*  PROT 7.0  --  5.6*  --   --   --   --   ALBUMIN 2.7*  --  2.1*  --   --   --   --   AST 86*  --  35  --   --   --   --   ALT 134*  --  70*  --   --   --   --   ALKPHOS 220*  --  145*   --   --   --   --   BILITOT 0.7  --  1.3*  --   --   --   --   GFRNONAA >60   < > 59*   < > 57* >60 >60  ANIONGAP 14   < > 9   < > 9 7 9    < > = values in this interval not displayed.    Hematology Recent Labs  Lab 08/30/21 0203 08/31/21 0139 09/01/21 0439  WBC 9.9 8.6 8.8  RBC 3.08* 2.88* 3.06*  HGB 9.2* 8.7* 9.1*  HCT 28.3* 26.7* 28.1*  MCV 91.9 92.7 91.8  MCH 29.9 30.2 29.7  MCHC 32.5 32.6 32.4  RDW 16.2* 16.0* 15.9*  PLT 249 233 246   BNP Recent Labs  Lab 08/26/21 1428  BNP 437.4*    DDimer No results for input(s): "DDIMER" in the last 168 hours.   Radiology  No results found.  Cardiac Studies  TTE 08/27/2021  1. Left ventricular ejection fraction, by estimation, is 50 to 55%. The  left ventricle has low normal function. The left ventricle has no regional  wall motion abnormalities. Diastolic function indeterminant due to Afib.   2. Right ventricular systolic function is mildly reduced. The right  ventricular size is normal. There is normal pulmonary artery systolic  pressure. The estimated right ventricular systolic pressure is 32.4 mmHg.   3. Left atrial size was mild to moderately dilated.   4. Right atrial size was mildly dilated.   5. There is a moderate sized, circumferential perficardial effusion  present that appears largest around the RA/RV base. The pericardial  effusion is circumferential. There is no evidence of cardiac tamponade.  There is prominent epicardial fat present.   6. The mitral valve is degenerative. Trivial mitral valve  regurgitation.  Severe mitral annular calcification.   7. The aortic valve is tricuspid. There is moderate calcification of the  aortic valve. There is moderate thickening of the aortic valve. Aortic  valve regurgitation is not visualized. Aortic valve  sclerosis/calcification is present, without any evidence  of aortic stenosis.   8. The inferior vena cava is dilated in size with >50% respiratory  variability,  suggesting right atrial pressure of 8 mmHg.  Patient Profile  Gwendolyn [name] is a 86 y.o. 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  Cardiology was consulted for recommendations regarding volume overload and A-fib.  Assessment & Plan   #Persistent atrial fibrillation -Appears to be a strict NPO.  Receiving medications via gastrostomy tube.  Was receiving IV digoxin.  She is on too much.  Jackson level elevated. -Would hold digoxin moving forward.  Could revisit a lower dose at a later date but clearly she is on too much. -We will continue with metoprolol to tartrate 12.5 mg.  Titrate up to every 6 hours. -We will allow for lenient rate control as she appears to be an organized atrial flutter from likely elevated digoxin level. -With chronic anemia.  No signs of bleeding.  On Eliquis 2.5 mg twice daily.  Apparently her weights have been low at times.  Pharmacy is recommending this dose currently.  Okay for now.  #Hyponatremia -Does not appear volume up.  Would pursue work-up for hyponatremia.  I do not believe heart failure explains this.  #Chronic diastolic heart failure -Appears euvolemic.  Continue Lasix as prescribed.  #Hypothyroidism -Per hospital medicine  #Recent stroke #Debility #PEG tube -Poor prognosis.  Palliative care would be a good option for her.    For questions or updates, please contact [facility] Please consult [email or web address] for contact info under   Signed, Gerri Spore T. Flora Lipps, MD, [facility]  [facility]  09/02/2021 11:16 AM

## 2021-09-02 NOTE — Progress Notes (Signed)
Date and time results received: 09/02/21 0312  Test: Digoxin Level Critical Value: 3.0  Name of Provider Notified: Dr. Arvilla Market  Orders Received? Or Actions Taken?:  Stat EKG

## 2021-09-02 NOTE — Significant Event (Signed)
Patient was noted to have a digoxin level of 3.  EKG ordered which shows A-fib.  Rate controlled.  Discussed with pharmacist and cardiologist Dr. Okey Dupre.  Cardiologist Dr. Okey Dupre advised to discontinue digoxin at this time.  No indication to start DigiFab as of now.  To monitor metabolic panel closely.  Midge Minium

## 2021-09-02 NOTE — Progress Notes (Signed)
Pharmacy Note  Spoke to poison control re: digoxin level 3.0.  RN and toxicologist agree that DigiBind can be held for now but that K+ of 5.1 with elevated digoxin level is concerning.  Recommended 1 vial of DigiBind if changes in conduction occur (such as QTc prolongation), can give IV push if needed.  Vernard Gambles, PharmD, BCPS 09/02/2021 4:41 AM

## 2021-09-02 NOTE — Progress Notes (Signed)
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 In the ED she was found to be in A-fib with RVR and respiratory distress.  She was placed on BiPAP for increased work of breathing.  Due to volume overload patient was given IV Lasix. Patient was also found to be in metabolic encephalopathy.   Subjective   This morning result was elevated at 3.0.  IV digoxin was discontinued.  Cardiology reconsulted.  Patient denies any symptoms.   Assessment/Plan:   Atrial fibrillation with RVR -Patient was on amiodarone which has been discontinued due to elevated TSH -Patient was on digoxin 0.25 mg IV daily, discontinued due to elevated dig level -She was started back on metoprolol 12.5 mg po every 12 hours, dose has been changed to 12.5 every 6 hours while tube -Patient was cleared by neurology to start back on Eliquis 2.5 twice daily on 07/16/2021 -Cardiology following; recommends to restart Eliquis -Patient started back on Eliquis 2.5 mg p.o. twice daily  Elevated dig level -Patient was started on digoxin 0.25 mg IV daily -Dig level elevated 3.0 -No indication for DigiFab as per cardiology -Digoxin has been discontinued  Hypertension -Blood pressure is now elevated -Restarted on metoprolol 12.5 mg every 6 hours while tube -She was on 12.5 mg every 8 hours at the skilled facility  Right pinna perichondritis -Patient keeps her head on right side on pillow -Has been a lesion likely from shearing stress on the skin of right ear -Dressing in place -She has significant serosanguineous discharge and erythema in  right ear pinna -Started on ciprofloxacin 400 mg IV  twice daily for 3 days -Wound care RN consulted  Hypothyroidism, new diagnosis -Likely in the setting of amiodarone use -Amiodarone has been discontinued per cardiology -Started on Synthroid 100 mcg daily  Large groin hernia -CT pelvis shows large right groin hernia containing small bowel loops -No complicating features noted -Spoke to general surgery PA, outpatient follow-up appointment will be scheduled for patient -No intervention required in the hospital  Constipation -Large rectal stool volume noted on CT pelvis -We will start MiraLAX 17 g twice daily  Metabolic encephalopathy -Likely multifactorial from hyponatremia, also found to have elevated TSH; 22 likely in the setting of amiodarone use -Significantly improved since starting Synthroid -Amiodarone has been discontinued per cardiology -Started on Synthroid 100 mcg daily  Normocytic anemia -Patient has chronic anemia -Hemoglobin dropped to 6.6  -S/p 1 unit PRBC -Hemoglobin has improved to 9.2 -Eliquis has been restarted, follow CBC in a.m.  Acute diastolic CHF -Chest x-ray showed bilateral pleural effusion -Also chest x-ray concerning for pulmonary edema -Patient started on furosemide 40 mg IV every 12 hours -Net -5.05 L -IV Lasix switched to p.o. Lasix 40 mg p.o. twice daily  Left upper extremity swelling -No DVT noted on the vascular ultrasound -Findings consistent with indeterminate superficial vein thrombosis involving the left basilic vein   Hyponatremia -Likely from hypervolemic hyponatremia from underlying CHF -Sodium is 126 today -Follow BMP in am   Medications     apixaban  2.5 mg Per Tube BID   atorvastatin  40 mg Per Tube Daily   feeding supplement (OSMOLITE 1.5  CAL)  300 mL Per Tube TID PC & HS   free water  160 mL Per Tube TID PC & HS   furosemide  40 mg Oral BID   levothyroxine  100 mcg Per Tube Q0600   metoprolol tartrate  12.5 mg Per  Tube Q6H     Data Reviewed:   CBG:  Recent Labs  Lab 09/01/21 2011 09/02/21 0006 09/02/21 0401 09/02/21 0746 09/02/21 1144  GLUCAP 157* 166* 146* 123* 141*    SpO2: 94 % O2 Flow Rate (L/min): 6 L/min FiO2 (%): 40 %    Vitals:   09/02/21 0400 09/02/21 0405 09/02/21 1219 09/02/21 1300  BP: (!) 154/102 (!) 179/87 (!) 172/100 (!) 156/83  Pulse: 97 94 (!) 110 67  Resp: (!) 24 (!) 25 (!) 22 (!) 22  Temp:  98.1 F (36.7 C) 97.8 F (36.6 C) 97.8 F (36.6 C)  TempSrc:  Oral    SpO2: (!) 77% 94%    Weight:      Height:          Data Reviewed:  Basic Metabolic Panel: Recent Labs  Lab 08/27/21 0405 08/27/21 1240 08/29/21 0250 08/30/21 0203 08/31/21 0139 09/01/21 0439 09/02/21 0200  NA 123*   < > 124* 124* 127* 126* 125*  K 4.8   < > 3.9 4.0 4.6 4.9 5.1  CL 83*   < > 91* 88* 90* 90* 88*  CO2 24   < > 24 25 28 29 28   GLUCOSE 117*   < > 175* 173* 175* 123* 168*  BUN 33*   < > 35* 32* 36* 31* 31*  CREATININE 0.99   < > 0.95 0.90 0.98 0.80 0.90  CALCIUM 8.5*   < > 7.9* 7.3* 7.6* 7.8* 7.9*  MG 1.9  --   --   --   --   --   --    < > = values in this interval not displayed.    CBC: Recent Labs  Lab 08/28/21 0216 08/29/21 0250 08/30/21 0203 08/31/21 0139 09/01/21 0439  WBC 10.7* 9.8 9.9 8.6 8.8  HGB 8.7* 8.4* 9.2* 8.7* 9.1*  HCT 25.8* 25.4* 28.3* 26.7* 28.1*  MCV 89.0 90.1 91.9 92.7 91.8  PLT 252 260 249 233 246    LFT Recent Labs  Lab 08/29/21 0250  AST 35  ALT 70*  ALKPHOS 145*  BILITOT 1.3*  PROT 5.6*  ALBUMIN 2.1*     Antibiotics: Anti-infectives (From admission, onward)    Start     Dose/Rate Route Frequency Ordered Stop   09/01/21 1700  ciprofloxacin (CIPRO) IVPB 400 mg        400 mg 200 mL/hr over 60 Minutes Intravenous Every 12 hours 09/01/21 1230 09/03/21 1659   09/01/21 0500  ciprofloxacin (CIPRO) IVPB 200 mg  Status:  Discontinued        200 mg 100 mL/hr over 60 Minutes Intravenous Every 12 hours 08/31/21 2045 09/01/21 1230    08/31/21 1245  ciprofloxacin (CIPRO) tablet 250 mg  Status:  Discontinued        250 mg Oral 2 times daily 08/31/21 1146 08/31/21 2045        DVT prophylaxis: SCDs  Code Status: Full code  Family Communication: No family at bedside   CONSULTS cardiology   Objective    Physical Examination:  General-appears in no acute distress Heart-S1-S2, regular, no murmur auscultated Lungs-clear to auscultation bilaterally, no wheezing or crackles auscultated Abdomen-soft, nontender, no organomegaly Extremities-no edema in  the lower extremities Neuro-alert, oriented x3, no focal deficit noted HEENT-right ear pinna in dressing   Status is: Inpatient: Atrial fibrillation with RVR, diastolic heart failure    Pressure Injury 07/03/21 Sacrum Medial Deep Tissue Pressure Injury - Purple or maroon localized area of discolored intact skin or blood-filled blister due to damage of underlying soft tissue from pressure and/or shear. (Active)  07/03/21 1642  Location: Sacrum  Location Orientation: Medial  Staging: Deep Tissue Pressure Injury - Purple or maroon localized area of discolored intact skin or blood-filled blister due to damage of underlying soft tissue from pressure and/or shear.  Wound Description (Comments):   Present on Admission: Yes     Pressure Injury 08/31/21 Ear Right;Mid Stage 3 -  Full thickness tissue loss. Subcutaneous fat may be visible but bone, tendon or muscle are NOT exposed. (Active)  08/31/21 1400  Location: Ear  Location Orientation: Right;Mid  Staging: Stage 3 -  Full thickness tissue loss. Subcutaneous fat may be visible but bone, tendon or muscle are NOT exposed.  Wound Description (Comments):   Present on Admission: No        Cammy Sanjurjo S Gabor Lusk   [facility] If 7PM-7AM, please contact night-coverage at [email or web address], Office  [phone]   09/02/2021, 4:02 PM  LOS: 7 days

## 2021-09-03 DIAGNOSIS — E871 Hypo-osmolality and hyponatremia: Secondary | ICD-10-CM | POA: Diagnosis not present

## 2021-09-03 DIAGNOSIS — E039 Hypothyroidism, unspecified: Secondary | ICD-10-CM | POA: Diagnosis present

## 2021-09-03 DIAGNOSIS — G9341 Metabolic encephalopathy: Secondary | ICD-10-CM | POA: Diagnosis not present

## 2021-09-03 DIAGNOSIS — I5033 Acute on chronic diastolic (congestive) heart failure: Secondary | ICD-10-CM | POA: Diagnosis not present

## 2021-09-03 DIAGNOSIS — I4891 Unspecified atrial fibrillation: Secondary | ICD-10-CM | POA: Diagnosis not present

## 2021-09-03 LAB — GLUCOSE, CAPILLARY
Glucose-Capillary: 125 mg/dL — ABNORMAL HIGH (ref 70–99)
Glucose-Capillary: 131 mg/dL — ABNORMAL HIGH (ref 70–99)
Glucose-Capillary: 162 mg/dL — ABNORMAL HIGH (ref 70–99)
Glucose-Capillary: 163 mg/dL — ABNORMAL HIGH (ref 70–99)
Glucose-Capillary: 182 mg/dL — ABNORMAL HIGH (ref 70–99)
Glucose-Capillary: 230 mg/dL — ABNORMAL HIGH (ref 70–99)

## 2021-09-03 LAB — BASIC METABOLIC PANEL
Anion gap: 9 (ref 5–15)
BUN: 28 mg/dL — ABNORMAL HIGH (ref 8–23)
CO2: 28 mmol/L (ref 22–32)
Calcium: 8.2 mg/dL — ABNORMAL LOW (ref 8.9–10.3)
Chloride: 86 mmol/L — ABNORMAL LOW (ref 98–111)
Creatinine, Ser: 0.74 mg/dL (ref 0.44–1.00)
GFR, Estimated: >60 mL/min (ref >60)
Glucose, Bld: 128 mg/dL — ABNORMAL HIGH (ref 70–99)
Potassium: 4.9 mmol/L (ref 3.5–5.1)
Sodium: 123 mmol/L — ABNORMAL LOW (ref 135–145)

## 2021-09-03 LAB — DIGOXIN LEVEL: Digoxin Level: 2 ng/mL (ref 0.8–2.0)

## 2021-09-03 MED ORDER — HYDRALAZINE HCL 20 MG/ML IJ SOLN
10.0000 mg | Freq: Four times a day (QID) | INTRAMUSCULAR | Status: DC | PRN
  Administered 2021-09-03: 10 mg via INTRAVENOUS
  Filled 2021-09-03: qty 1

## 2021-09-03 MED ORDER — METOPROLOL TARTRATE 25 MG/10 ML ORAL SUSPENSION
25.0000 mg | Freq: Three times a day (TID) | ORAL | Status: DC
  Administered 2021-09-03 – 2021-09-09 (×18): 25 mg
  Filled 2021-09-03 (×22): qty 10

## 2021-09-03 NOTE — Assessment & Plan Note (Signed)
Continue with levothyroxine  

## 2021-09-03 NOTE — Progress Notes (Signed)
CBG 182. Dr. Ella Jubilee notified. Will continue to monitor

## 2021-09-03 NOTE — Progress Notes (Signed)
Progress Note   Patient: Gwendolyn [name] VOZ:[id number] DOB: [birth year] DOA: 08/26/2021     8 DOS: the patient was seen and examined on 09/03/2021   Brief hospital course: [name] was admitted to the hospital with the working diagnosis of atrial fibrillation with RVR.   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 Patient has been on feeding tubed per PEG tube for nutritional support. Reported worsening dyspnea for several days, associated with lower extremity edema. Because of persistent symptoms EMS was called and patient was transported to the ED. On her initial physical examination she was in atrial fibrillation with RVR and in respiratory distress. Placed on Bipap and received IV furosemide. Blood pressure 99.67, HR 113, RR 35 02 saturation 99%, lungs with rales bilaterally, decreased breath sounds and increased work of breathing, heart with S1 and S2 present, irregularly irregular, abdomen not distended and positive lower extremity edema.   Na 123, K 4,4 CL 86, bicarbonate 22, glucose 98 bun 30 cr 0,90  Wbc 10,2 hgb 8,6 plt 240  Sars covid 19 negative  Urine analysis with SG 1,012 negative leukocytes, negative protein.   Head CT with no acute changes.   Chest radiograph with cardiomegaly with bilateral interstitial infiltrates with bilateral small pleural effusions.   EKG 129 bpm, normal axis, atrial fibrillation rhythm with no significant ST segment or T wave changes.   Patient was placed on furosemide for diuresis.  Added digoxin for rate control, along with amiodarone. Because elevated TSH amiodarone was discontinued and patient was transitioned to metoprolol.  Elevated dig levels, and digoxin was discontinued.   Assessment and Plan: * Atrial fibrillation with rapid ventricular response (HCC) Patient continue in atrial fibrillation, rate better controlled.  Plan to  continue with metoprolol and anticoagulation with apixaban Continue with telemetry monitoring.    Acute on chronic diastolic CHF (congestive heart failure) (HCC) Patient has been diuresed with furosemide Clinically her volume has improved,  Furosemide has been held today.   Plan to continue close follow up on volume status. Continue blood pressure monitoring.    Acute metabolic encephalopathy Multifactorial encephalopathy in the setting of CVA with hemorrhagic conversion.  Today she is able to respond to simple questions.  Positive paresis from recent CVA,  Continue nutrition per tube feedings.   Hyponatremia Patient with peripheral edema, possible related to low oncotic pressure Renal function with serum cr at 0,74, Na is 123 and K is 4,9 Plan to continue hold diuresis today and follow up renal function in am. Urinary electrolytes not accurate because patient has been on loop diuretics.     Normocytic anemia SP 1 unit PRBC transfusion Hgb has been stable with no signs of bleeding  Continue anticoagulation with apixaban.   Mixed hyperlipidemia Continue statin via PEG tube  Hypothyroidism Continue with levothyroxine         Subjective: Patient deconditioned and ill looking appearing, denies dyspnea or chest pain.   Physical Exam: Vitals:   09/03/21 1147 09/03/21 1330 09/03/21 1424 09/03/21 1639  BP: (!) 183/112 (!) 184/101 128/64 (!) 164/77  Pulse: 90   87  Resp: (!) 24   (!) 24  Temp: 98.2 F (36.8 C)   98.1 F (36.7 C)  TempSrc: Oral   Oral  SpO2: 98%   96%  Weight:      Height:       Neurology with chronic paresis. Able to respond to simple questions ENT  with mild pallor Cardiovascular with S1 and S2 present, irregularly irregular with no gallops, rubs or murmurs Respiratory with scattered rhonchi with no wheezing Abdomen not distended Positive left upper extremity edema  Data Reviewed:    Family Communication: no family at the bedside    Disposition: Status is: Inpatient Remains inpatient appropriate because: hyponatremia   Planned Discharge Destination: Skilled nursing facility     Author: Coralie Keens, MD 09/03/2021 4:50 PM  For on call review [email or web address].

## 2021-09-03 NOTE — Progress Notes (Signed)
Cardiology Progress Note  Patient ID: Gwendolyn [name] MRN: [id number] DOB: 28-Oct-1935 Date of Encounter: 09/03/2021  Primary Cardiologist: Chrystie Nose, MD  Subjective   Chief Complaint: None.   HPI: A-fib rates are much improved.  Digoxin level stable.  Sodium is worsening.  Euvolemic on exam.  ROS:  All other ROS reviewed and negative. Pertinent positives noted in the HPI.     Inpatient Medications  Scheduled Meds:  apixaban  2.5 mg Per Tube BID   atorvastatin  40 mg Per Tube Daily   feeding supplement (OSMOLITE 1.5 CAL)  300 mL Per Tube TID PC & HS   free water  160 mL Per Tube TID PC & HS   furosemide  40 mg Oral BID   levothyroxine  100 mcg Per Tube Q0600   metoprolol tartrate  25 mg Per Tube TID   Continuous Infusions:  PRN Meds: acetaminophen **OR** acetaminophen, ipratropium-albuterol, ondansetron **OR** ondansetron (ZOFRAN) IV, oxyCODONE   Vital Signs   Vitals:   09/03/21 0016 09/03/21 0300 09/03/21 0728 09/03/21 0750  BP: (!) 152/105 (!) 158/112 (!) 166/50 (!) 166/50  Pulse: 98 (!) 114 (!) 114   Resp: (!) 25 (!) 24 (!) 22 (!) 22  Temp: 98.1 F (36.7 C) 98.7 F (37.1 C) 98.3 F (36.8 C) 98.3 F (36.8 C)  TempSrc: Oral Oral  Oral  SpO2: 99% 97%  97%  Weight:      Height:        Intake/Output Summary (Last 24 hours) at 09/03/2021 0913 Last data filed at 09/03/2021 0500 Gross per 24 hour  Intake --  Output 855 ml  Net -855 ml      09/01/2021    3:58 AM 08/31/2021    4:44 AM 08/30/2021    5:36 AM  Last 3 Weights  Weight (lbs) 150 lb 9.2 oz 141 lb 15.6 oz 138 lb 14.2 oz  Weight (kg) 68.3 kg 64.4 kg 63 kg      Telemetry  Overnight telemetry shows A-fib heart rates 80 to 100 bpm, which I personally reviewed.   Physical Exam   Vitals:   09/03/21 0016 09/03/21 0300 09/03/21 0728 09/03/21 0750  BP: (!) 152/105 (!) 158/112 (!) 166/50 (!) 166/50  Pulse: 98 (!) 114 (!) 114   Resp: (!) 25 (!) 24 (!) 22 (!) 22  Temp: 98.1 F (36.7 C) 98.7 F  (37.1 C) 98.3 F (36.8 C) 98.3 F (36.8 C)  TempSrc: Oral Oral  Oral  SpO2: 99% 97%  97%  Weight:      Height:        Intake/Output Summary (Last 24 hours) at 09/03/2021 0913 Last data filed at 09/03/2021 0500 Gross per 24 hour  Intake --  Output 855 ml  Net -855 ml       09/01/2021    3:58 AM 08/31/2021    4:44 AM 08/30/2021    5:36 AM  Last 3 Weights  Weight (lbs) 150 lb 9.2 oz 141 lb 15.6 oz 138 lb 14.2 oz  Weight (kg) 68.3 kg 64.4 kg 63 kg    Body mass index is 28.45 kg/m.  General: Well nourished, well developed, in no acute distress Head: Atraumatic, normal size  Eyes: PEERLA, EOMI  Neck: Supple, no JVD Endocrine: No thryomegaly Cardiac: Normal S1, S2; irregular rhythm Lungs: Clear to auscultation bilaterally, no wheezing, rhonchi or rales  Abd: Soft, nontender, no hepatomegaly  Ext: No edema, pulses 2+ Musculoskeletal: No deformities Neuro: Alert and oriented to person, place,  time, and situation, CNII-XII grossly intact, no focal deficits  Psych: Normal mood and affect   Labs  High Sensitivity Troponin:   Recent Labs  Lab 08/26/21 1428 08/26/21 1700  TROPONINIHS 24* 22*     Cardiac EnzymesNo results for input(s): "TROPONINI" in the last 168 hours. No results for input(s): "TROPIPOC" in the last 168 hours.  Chemistry Recent Labs  Lab 08/29/21 0250 08/30/21 0203 09/01/21 0439 09/02/21 0200 09/03/21 0735  NA 124*   < > 126* 125* 123*  K 3.9   < > 4.9 5.1 4.9  CL 91*   < > 90* 88* 86*  CO2 24   < > 29 28 28   GLUCOSE 175*   < > 123* 168* 128*  BUN 35*   < > 31* 31* 28*  CREATININE 0.95   < > 0.80 0.90 0.74  CALCIUM 7.9*   < > 7.8* 7.9* 8.2*  PROT 5.6*  --   --   --   --   ALBUMIN 2.1*  --   --   --   --   AST 35  --   --   --   --   ALT 70*  --   --   --   --   ALKPHOS 145*  --   --   --   --   BILITOT 1.3*  --   --   --   --   GFRNONAA 59*   < > >60 >60 >60  ANIONGAP 9   < > 7 9 9    < > = values in this interval not displayed.     Hematology Recent Labs  Lab 08/30/21 0203 08/31/21 0139 09/01/21 0439  WBC 9.9 8.6 8.8  RBC 3.08* 2.88* 3.06*  HGB 9.2* 8.7* 9.1*  HCT 28.3* 26.7* 28.1*  MCV 91.9 92.7 91.8  MCH 29.9 30.2 29.7  MCHC 32.5 32.6 32.4  RDW 16.2* 16.0* 15.9*  PLT 249 233 246   BNPNo results for input(s): "BNP", "PROBNP" in the last 168 hours.  DDimer No results for input(s): "DDIMER" in the last 168 hours.   Radiology  No results found.  Cardiac Studies  TTE 08/27/2021   1. Left ventricular ejection fraction, by estimation, is 50 to 55%. The  left ventricle has low normal function. The left ventricle has no regional  wall motion abnormalities. Diastolic function indeterminant due to Afib.   2. Right ventricular systolic function is mildly reduced. The right  ventricular size is normal. There is normal pulmonary artery systolic  pressure. The estimated right ventricular systolic pressure is 32.4 mmHg.   3. Left atrial size was mild to moderately dilated.   4. Right atrial size was mildly dilated.   5. There is a moderate sized, circumferential perficardial effusion  present that appears largest around the RA/RV base. The pericardial  effusion is circumferential. There is no evidence of cardiac tamponade.  There is prominent epicardial fat present.   6. The mitral valve is degenerative. Trivial mitral valve regurgitation.  Severe mitral annular calcification.   7. The aortic valve is tricuspid. There is moderate calcification of the  aortic valve. There is moderate thickening of the aortic valve. Aortic  valve regurgitation is not visualized. Aortic valve  sclerosis/calcification is present, without any evidence  of aortic stenosis.   8. The inferior vena cava is dilated in size with >50% respiratory  variability, suggesting right atrial pressure of 8 mmHg.   Patient Profile  Gwendolyn [name] is  a 86 y.o. female with recent right MCA stroke (May 2023) status post PEG placement, persistent  atrial fibrillation, hypertension, hyperlipidemia, HFpEF who was admitted on 08/26/2021 with shortness of breath and peripheral edema as well as atrial fibrillation.  Cardiology was consulted for recommendations regarding volume overload and A-fib.  Assessment & Plan   #Persistent atrial fibrillation -Was receiving IV digoxin with elevated level.  This was stopped. -Would recommend to continue with metoprolol tartrate.  I have changed her back to 25 mg 3 times daily.  She was on 3 times daily dosing at her facility.  Can discontinue this.  Rate control strategy is recommended. -On Eliquis.  No signs of a bleeding.  We will continue this.  Be very cautious as she has had transfusion this admission.  No signs of bleeding.  #Hyponatremia -She is euvolemic.  Sodium continues to go down despite diuresis.  She is net -6.4 L.  Prior hospital team believe this was all related to heart failure however this is not.  Would consider nephrology evaluation.  Her sodium continues to go down despite diuresis.  This is inconsistent with hypervolemic hyponatremia.  Further work-up per hospital medicine. -She may actually be volume down.  #Chronic diastolic heart failure -Would be cautious with diuresis.  Sodium continues to go down.  She appears effectively euvolemic.  I will stop her Lasix today.  #Hypothyroidism -Per hospital medicine  #Debility #Recent stroke #PEG tube -Poor prognosis.  Would consider palliative care consult.  [facility] will sign off.   Medication Recommendations: Rate control as above for atrial fibrillation.  On Eliquis.  Further work-up of hyponatremia as above. Other recommendations (labs, testing, etc): None. Follow up as an outpatient: Please notify us when she is closer to discharge and we can arrange hospital follow-up.  For questions or updates, please contact [facility] Please consult [email or web address] for contact info under    Signed, Gerri Spore T. Flora Lipps, MD,  [facility]  [facility]  09/03/2021 9:13 AM

## 2021-09-03 NOTE — TOC Progression Note (Signed)
Transition of Care [facility]) - Progression Note    Patient Details  Name: Gwendolyn [name] MRN: [id number] Date of Birth: [birth year]  Transition of Care [facility]) CM/SW Contact  Delilah Shan, LCSWA Phone Number: 09/03/2021, 4:51 PM  Clinical Narrative:     Plan is for patient to return to [facility] when medically ready for dc. CSW will continue to follow and assist with patients dc planning needs.  Expected Discharge Plan: Skilled Nursing Facility Barriers to Discharge: Continued Medical Work up  Expected Discharge Plan and Services Expected Discharge Plan: Skilled Nursing Facility In-house Referral: Clinical Social Work     Living arrangements for the past 2 months: Skilled Holiday representative (From [place] [facility])                                       Social Determinants of Health (SDOH) Interventions    Readmission Risk Interventions     No data to display

## 2021-09-03 NOTE — Hospital Course (Addendum)
Mrs. Gwendolyn [name] was admitted to the hospital with the working diagnosis of atrial fibrillation with RVR.   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 Patient has been on feeding tubed per PEG tube for nutritional support. Reported worsening dyspnea for several days, associated with lower extremity edema. Because of persistent symptoms EMS was called and patient was transported to the ED. On her initial physical examination she was in atrial fibrillation with RVR and in respiratory distress. Placed on Bipap and received IV furosemide. Blood pressure 99.67, HR 113, RR 35 02 saturation 99%, lungs with rales bilaterally, decreased breath sounds and increased work of breathing, heart with S1 and S2 present, irregularly irregular, abdomen not distended and positive lower extremity edema.   Na 123, K 4,4 CL 86, bicarbonate 22, glucose 98 bun 30 cr 0,90  Wbc 10,2 hgb 8,6 plt 240  Sars covid 19 negative  Urine analysis with SG 1,012 negative leukocytes, negative protein.   Head CT with no acute changes.   Chest radiograph with cardiomegaly with bilateral interstitial infiltrates with bilateral small pleural effusions.   EKG 129 bpm, normal axis, atrial fibrillation rhythm with no significant ST segment or T wave changes.   Patient was placed on furosemide for diuresis.  Added digoxin for rate control, along with amiodarone. Because elevated TSH amiodarone was discontinued and patient was transitioned to metoprolol.  Elevated dig levels, and digoxin was discontinued.   Patient continue volume overloaded, slowly improving to diuresis.  Hyponatremia.

## 2021-09-04 ENCOUNTER — Inpatient Hospital Stay (HOSPITAL_COMMUNITY): Payer: Medicare Other

## 2021-09-04 DIAGNOSIS — I4891 Unspecified atrial fibrillation: Secondary | ICD-10-CM | POA: Diagnosis not present

## 2021-09-04 DIAGNOSIS — E039 Hypothyroidism, unspecified: Secondary | ICD-10-CM

## 2021-09-04 DIAGNOSIS — I5033 Acute on chronic diastolic (congestive) heart failure: Secondary | ICD-10-CM | POA: Diagnosis not present

## 2021-09-04 DIAGNOSIS — E871 Hypo-osmolality and hyponatremia: Secondary | ICD-10-CM | POA: Diagnosis not present

## 2021-09-04 DIAGNOSIS — G9341 Metabolic encephalopathy: Secondary | ICD-10-CM | POA: Diagnosis not present

## 2021-09-04 LAB — BASIC METABOLIC PANEL
Anion gap: 10 (ref 5–15)
BUN: 31 mg/dL — ABNORMAL HIGH (ref 8–23)
CO2: 28 mmol/L (ref 22–32)
Calcium: 8.4 mg/dL — ABNORMAL LOW (ref 8.9–10.3)
Chloride: 86 mmol/L — ABNORMAL LOW (ref 98–111)
Creatinine, Ser: 0.71 mg/dL (ref 0.44–1.00)
GFR, Estimated: >60 mL/min (ref >60)
Glucose, Bld: 107 mg/dL — ABNORMAL HIGH (ref 70–99)
Potassium: 4.8 mmol/L (ref 3.5–5.1)
Sodium: 124 mmol/L — ABNORMAL LOW (ref 135–145)

## 2021-09-04 LAB — GLUCOSE, CAPILLARY
Glucose-Capillary: 107 mg/dL — ABNORMAL HIGH (ref 70–99)
Glucose-Capillary: 140 mg/dL — ABNORMAL HIGH (ref 70–99)
Glucose-Capillary: 151 mg/dL — ABNORMAL HIGH (ref 70–99)
Glucose-Capillary: 176 mg/dL — ABNORMAL HIGH (ref 70–99)
Glucose-Capillary: 179 mg/dL — ABNORMAL HIGH (ref 70–99)
Glucose-Capillary: 187 mg/dL — ABNORMAL HIGH (ref 70–99)
Glucose-Capillary: 252 mg/dL — ABNORMAL HIGH (ref 70–99)

## 2021-09-04 MED ORDER — FUROSEMIDE 10 MG/ML IJ SOLN
40.0000 mg | Freq: Two times a day (BID) | INTRAMUSCULAR | Status: DC
  Administered 2021-09-04 – 2021-09-08 (×8): 40 mg via INTRAVENOUS
  Filled 2021-09-04 (×8): qty 4

## 2021-09-04 MED ORDER — FUROSEMIDE 10 MG/ML IJ SOLN
40.0000 mg | Freq: Once | INTRAMUSCULAR | Status: AC
Start: 2021-09-04 — End: 2021-09-04
  Administered 2021-09-04: 40 mg via INTRAVENOUS
  Filled 2021-09-04: qty 4

## 2021-09-04 MED ORDER — METOPROLOL TARTRATE 5 MG/5ML IV SOLN
5.0000 mg | Freq: Once | INTRAVENOUS | Status: AC
Start: 2021-09-04 — End: 2021-09-04
  Administered 2021-09-04: 5 mg via INTRAVENOUS
  Filled 2021-09-04: qty 5

## 2021-09-04 MED ORDER — METOLAZONE 5 MG PO TABS
2.5000 mg | ORAL_TABLET | Freq: Once | ORAL | Status: AC
Start: 2021-09-04 — End: 2021-09-04
  Administered 2021-09-04: 2.5 mg
  Filled 2021-09-04: qty 1

## 2021-09-04 NOTE — Progress Notes (Signed)
Patient is SHOB, dyspneic at rest, has tachypnea with respirations in the mid 20-30s. Patient lungs sound are coarse crackles on the left upper and lower lungs. Wheezes are scattered among all bases. Opyd MD  and Cosiano MD was notified regarding findings.

## 2021-09-04 NOTE — Progress Notes (Signed)
Nutrition Follow-up  DOCUMENTATION CODES:   Not applicable  INTERVENTION:   Continue tube feeds via PEG: 300 mL Osmolite 1.5 via PEG - QID 80 mL free water flush before and afer each bolus Provides 1800 kcal, 75 grams protein, and 1612 mL total of free water daily.  NUTRITION DIAGNOSIS:   Inadequate oral intake related to inability to eat as evidenced by NPO status. - Ongoing, being addressed via TF  GOAL:   Patient will meet greater than or equal to 90% of their needs - Met via TF  MONITOR:   TF tolerance  REASON FOR ASSESSMENT:   Consult Enteral/tube feeding initiation and management  ASSESSMENT:   Pt with past medical history of persistent atrial fibrillation, hypertension, hyperlipidemia and recent right MCA stroke with hemorrhagic transformation 06/2021 (S/P PEG 5/30) who presents with complaints of shortness of breath and peripheral edema.  Pt sleeping at time of RD visit, woke to RD voice.  Pt reports that she was nauseous, but was unable to provide any additional history.   Discussed with RN. Reports that she has not had any issues with tube feeds.  Medications reviewed and include: Lasix Labs reviewed: Sodium 124, BUN 31   NUTRITION - FOCUSED PHYSICAL EXAM:  Flowsheet Row Most Recent Value  Orbital Region Mild depletion  Upper Arm Region No depletion  Thoracic and Lumbar Region No depletion  Buccal Region No depletion  Temple Region No depletion  Clavicle Bone Region Moderate depletion  Clavicle and Acromion Bone Region Moderate depletion  Scapular Bone Region Moderate depletion  Dorsal Hand Mild depletion  Patellar Region Unable to assess  Anterior Thigh Region Unable to assess  Posterior Calf Region Severe depletion  Edema (RD Assessment) Mild  Hair Reviewed  Eyes Reviewed  Mouth Reviewed  Skin Reviewed  Nails Reviewed   Diet Order:   Diet Order             Diet NPO time specified Except for: Ice Chips, Sips with Meds  Diet effective now                    EDUCATION NEEDS:   Not appropriate for education at this time  Skin:  Skin Assessment: Skin Integrity Issues: Skin Integrity Issues:: DTI, Other (Comment) DTI: sacrum Other: skin tear rt ear  Last BM:  7/25  Height:  Ht Readings from Last 1 Encounters:  08/26/21 _0  (1.549 m)   Weight:  Wt Readings from Last 1 Encounters:  09/04/21 64 kg   Ideal Body Weight:  47.7 kg  BMI:  Body mass index is 26.66 kg/m.  Estimated Nutritional Needs:   Kcal:  1600-1800  Protein:  75-90 grams  Fluid:  > 1.6 L    Hermina Barters RD, LDN Clinical Dietitian See [facility] for contact information.

## 2021-09-04 NOTE — TOC Progression Note (Signed)
Transition of Care [facility]) - Progression Note    Patient Details  Name: Gwendolyn [name] MRN: [id number] Date of Birth: 04-Jun-1935  [facility] CM/SW Contact  Delilah Shan, LCSWA Phone Number: 09/04/2021, 1:33 PM  Clinical Narrative:     Plan is for patient to return to [facility] when medically ready for dc. CSW will continue to follow and assist with patients dc planning needs.  Expected Discharge Plan: Skilled Nursing Facility Barriers to Discharge: Continued Medical Work up  Expected Discharge Plan and Services Expected Discharge Plan: Skilled Nursing Facility In-house Referral: Clinical Social Work     Living arrangements for the past 2 months: Skilled Holiday representative ([facility] [place] [facility])                                       Social Determinants of Health (SDOH) Interventions    Readmission Risk Interventions     No data to display

## 2021-09-04 NOTE — Progress Notes (Signed)
Cardiology Progress Note  Patient ID: Gwendolyn [name] MRN: [id number] DOB: 10-Apr-1935 Date of Encounter: 09/04/2021  Primary Cardiologist: Chrystie Nose, MD  Subjective   Chief Complaint: SOB  HPI: Worsening shortness of breath overnight.  Chest CT performed which shows moderate bilateral pleural effusions with possible lobar pneumonia in the left lung base.  Started on diuresis.  Without complaints this morning.  ROS:  All other ROS reviewed and negative. Pertinent positives noted in the HPI.     Inpatient Medications  Scheduled Meds:  apixaban  2.5 mg Per Tube BID   atorvastatin  40 mg Per Tube Daily   feeding supplement (OSMOLITE 1.5 CAL)  300 mL Per Tube TID PC & HS   free water  160 mL Per Tube TID PC & HS   furosemide  40 mg Intravenous BID   levothyroxine  100 mcg Per Tube Q0600   metoprolol tartrate  25 mg Per Tube TID   Continuous Infusions:  PRN Meds: acetaminophen **OR** acetaminophen, hydrALAZINE, ipratropium-albuterol, ondansetron **OR** ondansetron (ZOFRAN) IV, oxyCODONE   Vital Signs   Vitals:   09/04/21 0028 09/04/21 0104 09/04/21 0352 09/04/21 0753  BP: (!) 152/69  (!) 152/69 (!) 151/85  Pulse: 84  84 86  Resp: (!) 28  (!) 28 (!) 26  Temp: 98 F (36.7 C)  98.2 F (36.8 C) 98.3 F (36.8 C)  TempSrc:   Oral Oral  SpO2:  98% 98% 99%  Weight:   64 kg   Height:        Intake/Output Summary (Last 24 hours) at 09/04/2021 1006 Last data filed at 09/04/2021 0854 Gross per 24 hour  Intake --  Output 1480 ml  Net -1480 ml      09/04/2021    3:52 AM 09/01/2021    3:58 AM 08/31/2021    4:44 AM  Last 3 Weights  Weight (lbs) 141 lb 1.5 oz 150 lb 9.2 oz 141 lb 15.6 oz  Weight (kg) 64 kg 68.3 kg 64.4 kg      Telemetry  Overnight telemetry shows Afib 90s, which I personally reviewed.   Physical Exam   Vitals:   09/04/21 0028 09/04/21 0104 09/04/21 0352 09/04/21 0753  BP: (!) 152/69  (!) 152/69 (!) 151/85  Pulse: 84  84 86  Resp: (!) 28  (!) 28  (!) 26  Temp: 98 F (36.7 C)  98.2 F (36.8 C) 98.3 F (36.8 C)  TempSrc:   Oral Oral  SpO2:  98% 98% 99%  Weight:   64 kg   Height:        Intake/Output Summary (Last 24 hours) at 09/04/2021 1006 Last data filed at 09/04/2021 0854 Gross per 24 hour  Intake --  Output 1480 ml  Net -1480 ml       09/04/2021    3:52 AM 09/01/2021    3:58 AM 08/31/2021    4:44 AM  Last 3 Weights  Weight (lbs) 141 lb 1.5 oz 150 lb 9.2 oz 141 lb 15.6 oz  Weight (kg) 64 kg 68.3 kg 64.4 kg    Body mass index is 26.66 kg/m.  General: Ill-appearing Head: Atraumatic, normal size  Eyes: PEERLA, EOMI  Neck: Supple, no JVD Endocrine: No thryomegaly Cardiac: Normal S1, S2; irregular rhythm, no murmurs Lungs: Diminished breath sounds Abd: Soft, nontender, no hepatomegaly  Ext: No edema, pulses 2+ Musculoskeletal: No deformities Neuro: Alert and oriented to person, place, time, and situation, CNII-XII grossly intact, no focal deficits  Psych: Normal mood  and affect   Labs  High Sensitivity Troponin:   Recent Labs  Lab 08/26/21 1428 08/26/21 1700  TROPONINIHS 24* 22*     Cardiac EnzymesNo results for input(s): "TROPONINI" in the last 168 hours. No results for input(s): "TROPIPOC" in the last 168 hours.  Chemistry Recent Labs  Lab 08/29/21 0250 08/30/21 0203 09/02/21 0200 09/03/21 0735 09/04/21 0746  NA 124*   < > 125* 123* 124*  K 3.9   < > 5.1 4.9 4.8  CL 91*   < > 88* 86* 86*  CO2 24   < > 28 28 28   GLUCOSE 175*   < > 168* 128* 107*  BUN 35*   < > 31* 28* 31*  CREATININE 0.95   < > 0.90 0.74 0.71  CALCIUM 7.9*   < > 7.9* 8.2* 8.4*  PROT 5.6*  --   --   --   --   ALBUMIN 2.1*  --   --   --   --   AST 35  --   --   --   --   ALT 70*  --   --   --   --   ALKPHOS 145*  --   --   --   --   BILITOT 1.3*  --   --   --   --   GFRNONAA 59*   < > >60 >60 >60  ANIONGAP 9   < > 9 9 10    < > = values in this interval not displayed.    Hematology Recent Labs  Lab 08/30/21 0203  08/31/21 0139 09/01/21 0439  WBC 9.9 8.6 8.8  RBC 3.08* 2.88* 3.06*  HGB 9.2* 8.7* 9.1*  HCT 28.3* 26.7* 28.1*  MCV 91.9 92.7 91.8  MCH 29.9 30.2 29.7  MCHC 32.5 32.6 32.4  RDW 16.2* 16.0* 15.9*  PLT 249 233 246   BNPNo results for input(s): "BNP", "PROBNP" in the last 168 hours.  DDimer No results for input(s): "DDIMER" in the last 168 hours.   Radiology  CT CHEST WO CONTRAST  Result Date: 09/04/2021 CLINICAL DATA:  Pneumonia.  Complication suspected. EXAM: CT CHEST WITHOUT CONTRAST TECHNIQUE: Multidetector CT imaging of the chest was performed following the standard protocol without IV contrast. RADIATION DOSE REDUCTION: This exam was performed according to the departmental dose-optimization program which includes automated exposure control, adjustment of the mA and/or kV according to patient size and/or use of iterative reconstruction technique. COMPARISON:  Plain film of earlier in the morning. CTA chest of 08/26/2021 FINDINGS: Cardiovascular: Mild motion degradation throughout. Exam also degraded by patient arm position, not raised above the head. Aortic atherosclerosis. Tortuous thoracic aorta. Moderate cardiomegaly, without pericardial effusion. Left main and 3 vessel coronary artery calcification. Aortic valve calcification. Pulmonary artery enlargement, outflow tract 3.2 cm Mediastinum/Nodes: Borderline adenopathy within the azygoesophageal recess at 10 mm is similar and favored to be reactive. Hilar regions poorly evaluated without intravenous contrast. Lungs/Pleura: Moderate bilateral pleural effusions. Similar on the left and slightly increased on the right. Persistent collapse/consolidation within both lower lobes, greater left than right. Worsened upper lobe aeration. Increase in multifocal bilateral upper lobe ground-glass opacities with concurrent septal thickening. Increased lingular airspace disease which is likely atelectasis. Upper Abdomen: Normal imaged portions of the liver,  spleen. Musculoskeletal: Anasarca. Mild T12 compression deformity is similar. IMPRESSION: 1. Motion and patient position degraded exam. 2. Worsened aeration since the prior CT, secondary to increased upper lobe ground-glass opacities, favoring pulmonary edema. Atypical  infection could look similar. 3. Moderate bilateral pleural effusions with bibasilar airspace disease, likely atelectasis. At the left lung base, lobar pneumonia or aspiration cannot be excluded. 4. Coronary artery atherosclerosis. Aortic Atherosclerosis (ICD10-I70.0). 5. Pulmonary artery enlargement suggests pulmonary arterial hypertension. 6. Aortic valvular calcifications. Consider echocardiography to evaluate for valvular dysfunction. Electronically Signed   By: Abigail Miyamoto M.D.   On: 09/04/2021 08:28   DG CHEST PORT 1 VIEW  Result Date: 09/04/2021 CLINICAL DATA:  Shortness of breath EXAM: PORTABLE CHEST 1 VIEW COMPARISON:  Previous studies including the examination of 08/26/2021 FINDINGS: Transverse diameter of the heart is increased. There is interval decrease in pulmonary vascular congestion. There is increased opacity in left mid and left lower lung fields suggesting increase in amount of left pleural effusion. Evaluation for atelectasis/pneumonia in the left mid and left lower lung fields is limited by the effusion. Small right pleural effusion is noted. There is no pneumothorax. Dense calcification is seen in mitral annulus. IMPRESSION: There is interval decrease in pulmonary vascular congestion. There is interval increase in left pleural effusion obscuring left mid and left lower lung fields. Small right pleural effusion. Electronically Signed   By: Elmer Picker M.D.   On: 09/04/2021 08:24    Cardiac Studies  TTE 08/27/2021   1. Left ventricular ejection fraction, by estimation, is 50 to 55%. The  left ventricle has low normal function. The left ventricle has no regional  wall motion abnormalities. Diastolic function  indeterminant due to Afib.   2. Right ventricular systolic function is mildly reduced. The right  ventricular size is normal. There is normal pulmonary artery systolic  pressure. The estimated right ventricular systolic pressure is [id number] mmHg.   3. Left atrial size was mild to moderately dilated.   4. Right atrial size was mildly dilated.   5. There is a moderate sized, circumferential perficardial effusion  present that appears largest around the RA/RV base. The pericardial  effusion is circumferential. There is no evidence of cardiac tamponade.  There is prominent epicardial fat present.   6. The mitral valve is degenerative. Trivial mitral valve regurgitation.  Severe mitral annular calcification.   7. The aortic valve is tricuspid. There is moderate calcification of the  aortic valve. There is moderate thickening of the aortic valve. Aortic  valve regurgitation is not visualized. Aortic valve  sclerosis/calcification is present, without any evidence  of aortic stenosis.   8. The inferior vena cava is dilated in size with >50% respiratory  variability, suggesting right atrial pressure of 8 mmHg.   Patient Profile  Gwendolyn [name] is a 86 y.o. 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  Cardiology was consulted for recommendations regarding volume overload and A-fib.  Assessment & Plan   #Persistent atrial fibrillation -Was supratherapeutic on digoxin.  We have stopped this.  We will just continue with metoprolol tartrate 25 mg 3 times daily.  She was on 12.5 mg 3 times daily at her facility and was rate controlled.  Rates are very well controlled. -Rate control strategy recommended given current state which includes recent stroke and debility. -Currently on Eliquis.  Has had anemia  which appears to be chronic disease related.  No signs of bleeding.  Okay to continue for now.  #Hypoxic respiratory failure #Bilateral pleural effusions #Left lower lobe pneumonia #Hyponatremia -Clinically she does  not appear volume overloaded.  Net -7.9 L since admission. chest x-ray with persistent opacifications.  Chest CT today shows moderate bilateral pleural effusions.  Does not appear to have any fluid in her lower extremities.   -She has diuresed well this admission and still has hyponatremia.  She appears to be having more fluid than was once thought. -Would recommend to go back to IV Lasix 40 mg twice daily.  She may need evaluation by pulmonary for thoracentesis.  We will see how she does. -Close monitoring of sodium.  Seems to be improving.  #Chronic diastolic heart failure -Diuresis as above.  40 mg lasix IV twice daily.  Net -7.9 L since admission.  #Hypothyroidism -Synthroid  #Debility #Recent stroke #PEG tube -Currently bedridden.  Not a great candidate for aggressive cardiovascular care.  Continue with diuresis as above. -would benefit from palliative care consultation   For questions or updates, please contact [place] Please consult [email or web address] for contact info under    Signed, [place] B[place] T. Audie Box, MD, [place]  09/04/2021 10:06 AM

## 2021-09-04 NOTE — Progress Notes (Addendum)
Progress Note   Patient: Gwendolyn [name] SEG:[id number] DOB: 04-02-1935 DOA: 08/26/2021     9 DOS: the patient was seen and examined on 09/04/2021   Brief hospital course: [name] was admitted to the hospital with the working diagnosis of atrial fibrillation with RVR.   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 Patient has been on feeding tubed per PEG tube for nutritional support. Reported worsening dyspnea for several days, associated with lower extremity edema. Because of persistent symptoms EMS was called and patient was transported to the ED. On her initial physical examination she was in atrial fibrillation with RVR and in respiratory distress. Placed on Bipap and received IV furosemide. Blood pressure 99.67, HR 113, RR 35 02 saturation 99%, lungs with rales bilaterally, decreased breath sounds and increased work of breathing, heart with S1 and S2 present, irregularly irregular, abdomen not distended and positive lower extremity edema.   Na 123, K 4,4 CL 86, bicarbonate 22, glucose 98 bun 30 cr 0,90  Wbc 10,2 hgb 8,6 plt 240  Sars covid 19 negative  Urine analysis with SG 1,012 negative leukocytes, negative protein.   Head CT with no acute changes.   Chest radiograph with cardiomegaly with bilateral interstitial infiltrates with bilateral small pleural effusions.   EKG 129 bpm, normal axis, atrial fibrillation rhythm with no significant ST segment or T wave changes.   Patient was placed on furosemide for diuresis.  Added digoxin for rate control, along with amiodarone. Because elevated TSH amiodarone was discontinued and patient was transitioned to metoprolol.  Elevated dig levels, and digoxin was discontinued.   Assessment and Plan: * Atrial fibrillation with rapid ventricular response (HCC) Patient continue in atrial fibrillation, rate better controlled.  Plan to  continue with metoprolol and anticoagulation with apixaban Continue with telemetry monitoring.    Acute on chronic diastolic CHF (congestive heart failure) (HCC) Echocardiogram with preserved LV systolic function with EF 50 to 55%, with mild reduced RV systolic function. RVSP 32.4  Pericardial effusion.   Patient continue with volume overload.  Systolic blood pressure 125 to 146 mmHg Documented urine output is 580 ml.   CT chest with bilateral ground glass opacities.  Bilateral pleural effusions and compression atelectasis.   Plan to continue diuresis with furosemide.  Add one dose of metolazone.   Keep negative fluid balance.   Acute metabolic encephalopathy Multifactorial encephalopathy in the setting of CVA with hemorrhagic conversion.  Today she is able to respond to simple questions.  Positive paresis from recent CVA, more left sided than right.  Continue nutrition per tube feedings.   Hyponatremia Continue hypervolemic.  Renal function with serum cr at 0,71, K is 4,8 and serum bicarbonate at 28.   Plan to continue diuresis with furosemide, add one dose of metolazone.  Stop free water per peg tube.  Follow up renal function and electrolytes in am.       Normocytic anemia SP 1 unit PRBC transfusion Hgb has been stable with no signs of bleeding  Continue anticoagulation with apixaban.   Mixed hyperlipidemia Continue statin via PEG tube  Hypothyroidism Continue with levothyroxine         Subjective: Patient continue being poorly responsive, only answers simple questions, denies dyspnea.   Physical Exam: Vitals:   09/04/21 0352 09/04/21 0753 09/04/21 1238 09/04/21 1600  BP: (!) 152/69 (!) 151/85 125/66 (!) 146/92  Pulse: 84 86 87 (!) 108  Resp: (!) 28 (!)  26 (!) 23 (!) 23  Temp: 98.2 F (36.8 C) 98.3 F (36.8 C) 98 F (36.7 C) 98.3 F (36.8 C)  TempSrc: Oral Oral Oral Oral  SpO2: 98% 99% 99% 99%  Weight: 64 kg     Height:       Neurology awake,  positive left hemiparesis.  ENT with mild pallor Cardiovascular with S1 and S2 present irregularly irregular  Respiratory with bilateral rhonchi and increase work of breathing Abdomen soft and PEG tube in place Trace lower extremity edema, left upper extremity edema  Data Reviewed:    Family Communication: no family at the bedside   Disposition: Status is: Inpatient Remains inpatient appropriate because: atrial fibrillation and volume overload   Planned Discharge Destination: Skilled nursing facility      Author: Coralie Keens, MD 09/04/2021 4:20 PM  For on call review [email or web address].

## 2021-09-04 NOTE — Progress Notes (Addendum)
Asked to see patient regarding volume status by primary team given cardiology was consulted for diastolic heart failure and atrial fibrillation.  Per nursing staff, patient with increased work of breathing tonight despite good oxygen saturation on 2 L nasal cannula.  Lasix has been stopped given clinically euvolemic and worsening hyponatremia.  Plan for nephrology to evaluate given her worsening electrolyte derangements.  Review of vital signs demonstrate increased respiratory rate for some time.  Subjectively she feels like she is working harder to breathe.  On exam she has coarse breath sounds bilaterally and the chest x-ray demonstrating worsening left pleural effusion and persistent pulmonary edema.  Suspect that her pulmonary fluid is less related to cardiac diastolic dysfunction rather a primary lung vs. Aspiration vs. Worsened effusion; however, from a symptomatic and work of breathing standpoint redosing diuretics likely has increased benefit rather than risk in terms of her hyponatremia.    Recommendations: - Patient euvolemic on exam and suspect her pulmonary status is not related to volume overload, diastolic dysfunction; rather a separate process - Reasonable to give spot dose IV Lasix tonight with close sodium monitoring given increased work of breathing -Consider repeat imaging in the morning and diagnostic/therapeutic thoracentesis  Otilio Carpen. Hetty Ely, MD  Cardiology Fellow

## 2021-09-04 NOTE — Plan of Care (Signed)
  Problem: Education: Goal: Knowledge of General Education information will improve Description: Including pain rating scale, medication(s)/side effects and non-pharmacologic comfort measures 09/04/2021 1655 by Mamie Nick I, RN Outcome: Progressing 09/04/2021 1650 by Mamie Nick I, RN Outcome: Progressing   Problem: Health Behavior/Discharge Planning: Goal: Ability to manage health-related needs will improve 09/04/2021 1655 by Mamie Nick I, RN Outcome: Progressing 09/04/2021 1650 by Mamie Nick I, RN Outcome: Progressing   Problem: Clinical Measurements: Goal: Ability to maintain clinical measurements within normal limits will improve 09/04/2021 1655 by Mamie Nick I, RN Outcome: Progressing 09/04/2021 1650 by Mamie Nick I, RN Outcome: Progressing Goal: Will remain free from infection 09/04/2021 1655 by Mamie Nick I, RN Outcome: Progressing 09/04/2021 1650 by Mamie Nick I, RN Outcome: Progressing Goal: Diagnostic test results will improve 09/04/2021 1655 by Mamie Nick I, RN Outcome: Progressing 09/04/2021 1650 by Mamie Nick I, RN Outcome: Progressing Goal: Respiratory complications will improve 09/04/2021 1655 by Mamie Nick I, RN Outcome: Progressing 09/04/2021 1650 by Mamie Nick I, RN Outcome: Progressing Goal: Cardiovascular complication will be avoided 09/04/2021 1655 by Mamie Nick I, RN Outcome: Progressing 09/04/2021 1650 by Mamie Nick I, RN Outcome: Progressing   Problem: Activity: Goal: Risk for activity intolerance will decrease 09/04/2021 1655 by Mamie Nick I, RN Outcome: Progressing 09/04/2021 1650 by Mamie Nick I, RN Outcome: Progressing   Problem: Nutrition: Goal: Adequate nutrition will be maintained 09/04/2021 1655 by Mamie Nick I, RN Outcome: Progressing 09/04/2021 1650 by Mamie Nick I, RN Outcome: Progressing   Problem: Coping: Goal: Level  of anxiety will decrease 09/04/2021 1655 by Mamie Nick I, RN Outcome: Progressing 09/04/2021 1650 by Mamie Nick I, RN Outcome: Progressing   Problem: Elimination: Goal: Will not experience complications related to bowel motility 09/04/2021 1655 by Mamie Nick I, RN Outcome: Progressing 09/04/2021 1650 by Mamie Nick I, RN Outcome: Progressing Goal: Will not experience complications related to urinary retention 09/04/2021 1655 by Mamie Nick I, RN Outcome: Progressing 09/04/2021 1650 by Mamie Nick I, RN Outcome: Progressing   Problem: Pain Managment: Goal: General experience of comfort will improve 09/04/2021 1655 by Mamie Nick I, RN Outcome: Progressing 09/04/2021 1650 by Mamie Nick I, RN Outcome: Progressing   Problem: Safety: Goal: Ability to remain free from injury will improve 09/04/2021 1655 by Mamie Nick I, RN Outcome: Progressing 09/04/2021 1650 by Mamie Nick I, RN Outcome: Progressing   Problem: Skin Integrity: Goal: Risk for impaired skin integrity will decrease 09/04/2021 1655 by Mamie Nick I, RN Outcome: Progressing 09/04/2021 1650 by Mamie Nick I, RN Outcome: Progressing

## 2021-09-04 NOTE — Plan of Care (Signed)

## 2021-09-04 NOTE — Progress Notes (Signed)
MD on-call notified about ants found in bed with patient. The patient body presents with no visible bites. Will continue to monitor for changes

## 2021-09-05 DIAGNOSIS — G9341 Metabolic encephalopathy: Secondary | ICD-10-CM | POA: Diagnosis not present

## 2021-09-05 DIAGNOSIS — E871 Hypo-osmolality and hyponatremia: Secondary | ICD-10-CM | POA: Diagnosis not present

## 2021-09-05 DIAGNOSIS — I5033 Acute on chronic diastolic (congestive) heart failure: Secondary | ICD-10-CM | POA: Diagnosis not present

## 2021-09-05 DIAGNOSIS — I4891 Unspecified atrial fibrillation: Secondary | ICD-10-CM | POA: Diagnosis not present

## 2021-09-05 LAB — GLUCOSE, CAPILLARY
Glucose-Capillary: 113 mg/dL — ABNORMAL HIGH (ref 70–99)
Glucose-Capillary: 146 mg/dL — ABNORMAL HIGH (ref 70–99)
Glucose-Capillary: 109 mg/dL — ABNORMAL HIGH (ref 70–99)
Glucose-Capillary: 104 mg/dL — ABNORMAL HIGH (ref 70–99)
Glucose-Capillary: 160 mg/dL — ABNORMAL HIGH (ref 70–99)

## 2021-09-05 LAB — BASIC METABOLIC PANEL
Anion gap: 10 (ref 5–15)
BUN: 35 mg/dL — ABNORMAL HIGH (ref 8–23)
CO2: 28 mmol/L (ref 22–32)
Calcium: 8.2 mg/dL — ABNORMAL LOW (ref 8.9–10.3)
Chloride: 85 mmol/L — ABNORMAL LOW (ref 98–111)
Creatinine, Ser: 0.76 mg/dL (ref 0.44–1.00)
GFR, Estimated: >60 mL/min (ref >60)
Glucose, Bld: 146 mg/dL — ABNORMAL HIGH (ref 70–99)
Potassium: 4.5 mmol/L (ref 3.5–5.1)
Sodium: 123 mmol/L — ABNORMAL LOW (ref 135–145)

## 2021-09-05 LAB — MAGNESIUM: Magnesium: 1.9 mg/dL (ref 1.7–2.4)

## 2021-09-05 NOTE — Plan of Care (Signed)

## 2021-09-05 NOTE — TOC Progression Note (Signed)
Transition of Care [facility]) - Progression Note    Patient Details  Name: Gwendolyn [name] MRN: [id number] Date of Birth: 1935-04-27  Transition of Care [facility]) CM/SW Contact  Delilah Shan, LCSWA Phone Number: 09/05/2021, 11:25 AM  Clinical Narrative:     Plan is for patient to return to [facility] when medically ready for dc. CSW will continue to follow and assist with patients dc planning needs.  Expected Discharge Plan: Skilled Nursing Facility Barriers to Discharge: Continued Medical Work up  Expected Discharge Plan and Services Expected Discharge Plan: Skilled Nursing Facility In-house Referral: Clinical Social Work     Living arrangements for the past 2 months: Skilled Holiday representative (From [place] [facility])                                       Social Determinants of Health (SDOH) Interventions    Readmission Risk Interventions     No data to display

## 2021-09-05 NOTE — Progress Notes (Signed)
Progress Note   Patient: Gwendolyn [name] WUJ:[id number] DOB: March 07, 1935 DOA: 08/26/2021     10 DOS: the patient was seen and examined on 09/05/2021   Brief hospital course: Gwendolyn [name] was admitted to the hospital with the working diagnosis of atrial fibrillation with RVR.   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 Patient has been on feeding tubed per PEG tube for nutritional support. Reported worsening dyspnea for several days, associated with lower extremity edema. Because of persistent symptoms EMS was called and patient was transported to the ED. On her initial physical examination she was in atrial fibrillation with RVR and in respiratory distress. Placed on Bipap and received IV furosemide. Blood pressure 99.67, HR 113, RR 35 02 saturation 99%, lungs with rales bilaterally, decreased breath sounds and increased work of breathing, heart with S1 and S2 present, irregularly irregular, abdomen not distended and positive lower extremity edema.   Na 123, K 4,4 CL 86, bicarbonate 22, glucose 98 bun 30 cr 0,90  Wbc 10,2 hgb 8,6 plt 240  Sars covid 19 negative  Urine analysis with SG 1,012 negative leukocytes, negative protein.   Head CT with no acute changes.   Chest radiograph with cardiomegaly with bilateral interstitial infiltrates with bilateral small pleural effusions.   EKG 129 bpm, normal axis, atrial fibrillation rhythm with no significant ST segment or T wave changes.   Patient was placed on furosemide for diuresis.  Added digoxin for rate control, along with amiodarone. Because elevated TSH amiodarone was discontinued and patient was transitioned to metoprolol.  Elevated dig levels, and digoxin was discontinued.   Continue volume overloaded.   Assessment and Plan: * Atrial fibrillation with rapid ventricular response (HCC) Patient required IV metoprolol last night,  looks that she missed on enteral dose during the day.   This am HR in the 70.s continue with atrial fibrillation rhythm.   Continue anticoagulation with apixaban.    Acute on chronic diastolic CHF (congestive heart failure) (HCC) Echocardiogram with preserved LV systolic function with EF 50 to 55%, with mild reduced RV systolic function. RVSP 32.4  Pericardial effusion.   CT chest with bilateral ground glass opacities.  Bilateral pleural effusions and compression atelectasis.   Documented urine output 1500 ml 07/27 metolazone 2,5 mg  Plan to continue diuresis with furosemide.  Clinically he volume is improving.   Acute metabolic encephalopathy Multifactorial encephalopathy in the setting of CVA with hemorrhagic conversion.   Positive paresis from recent CVA, more left sided than right.  Continue nutrition per tube feedings.  Mentation seems to be improving.   Hyponatremia Patient is responding to diuresis with improved volume status.  Renal function with serum cr at 0,76 with K at 4,5 and serum bicarbonate at 28 Na is 123 and Mg 1,9   Plan to continue diuresis and follow up renal function and electrolytes in am. Add 2 g mag sulfate.  Continue to hold on free water flushes per PEG tube.      Normocytic anemia SP 1 unit PRBC transfusion Hgb has been stable with no signs of bleeding  Continue anticoagulation with apixaban.   Mixed hyperlipidemia Continue statin via PEG tube  Hypothyroidism Continue with levothyroxine         Subjective: Patient is more awake and responsive today, is tolerating tube feedings well.  Physical Exam: Vitals:   09/04/21 2227 09/04/21 2300 09/05/21 0453 09/05/21 0801  BP: 122/61 133/67 (!) 142/80 131/61  Pulse: 87  82 (!) 109  Resp: (!) 24 (!) 21 (!) 23 (!) 21  Temp: 98.3 F (36.8 C)  98.4 F (36.9 C) 98 F (36.7 C)  TempSrc:   Oral Oral  SpO2:   100% 99%  Weight:   66.5 kg   Height:       Neurology with chronic left  hemiparesis. Awake and able to respond to simple questions ENT with mild pallor Cardiovascular with S1 and S2 present, irregularly irregular Respiratory with scattered rhonchi but not wheezing Abdomen not distended Positive right upper extremity edema., trace lower extremity edema   Data Reviewed:    Family Communication: no family at the bedside   Disposition: Status is: Inpatient Remains inpatient appropriate because: heart failure   Planned Discharge Destination: Skilled nursing facility    Author: Coralie Keens, MD 09/05/2021 11:30 AM  For on call review [email or web address].

## 2021-09-05 NOTE — Progress Notes (Signed)
     Referral received for Gwendolyn [name] :goals of care discussion. Chart reviewed and updates received from RN. Patient assessed and is unable to engage appropriately in discussions. Attempted to contact patient's niece Gwendolyn [name]. Unable to reach. Voicemail left with contact information given.   PMT will re-attempt to contact family at a later time/date. Detailed note and recommendations to follow once GOC has been completed.   Thank you for your referral and allowing PMT to assist in Gwendolyn [name]'s care.   Richardson Dopp, [facility] Palliative Medicine Team  Team Phone # [phone]   NO CHARGE

## 2021-09-05 NOTE — Progress Notes (Signed)
   09/04/21 2300  Assess: MEWS Score  BP 133/67  MAP (mmHg) 87  ECG Heart Rate (!) 119  Resp (!) 21  Assess: MEWS Score  MEWS Temp 0  MEWS Systolic 0  MEWS Pulse 2  MEWS RR 1  MEWS LOC 0  MEWS Score 3  MEWS Score Color Yellow  Assess: if the MEWS score is Yellow or Red  Were vital signs taken at a resting state? Yes  Focused Assessment No change from prior assessment  Does the patient meet 2 or more of the SIRS criteria? Yes  Does the patient have a confirmed or suspected source of infection? Yes  Provider and Rapid Response Notified? No  MEWS guidelines implemented *See Row Information* No, vital signs rechecked  Treat  MEWS Interventions Administered prn meds/treatments  Pain Scale 0-10  Pain Score 0  Notify: Provider  Provider Name/Title Oypd  Date Provider Notified 09/04/21  Time Provider Notified 2245  Method of Notification Page  Notification Reason Other (Comment) (increased heart rate)  Provider response See new orders  Date of Provider Response 09/04/21  Time of Provider Response 2250  Document  Patient Outcome Stabilized after interventions  Assess: SIRS CRITERIA  SIRS Temperature  0  SIRS Pulse 1  SIRS Respirations  1  SIRS WBC 1  SIRS Score Sum  3

## 2021-09-05 NOTE — Progress Notes (Addendum)
Progress Note  [name] Date of Encounter: 09/05/2021  [facility] Cardiologist: Chrystie Nose, MD   Subjective   Remains dyspneic; no CP  Inpatient Medications    Scheduled Meds:  apixaban  2.5 mg Per Tube BID   atorvastatin  40 mg Per Tube Daily   feeding supplement (OSMOLITE 1.5 CAL)  300 mL Per Tube TID PC & HS   furosemide  40 mg Intravenous BID   levothyroxine  100 mcg Per Tube Q0600   metoprolol tartrate  25 mg Per Tube TID   Continuous Infusions:  PRN Meds: acetaminophen **OR** acetaminophen, hydrALAZINE, ipratropium-albuterol, ondansetron **OR** ondansetron (ZOFRAN) IV, oxyCODONE   Vital Signs    Vitals:   09/04/21 2223 09/04/21 2227 09/04/21 2300 09/05/21 0453  BP: 122/66 122/61 133/67 (!) 142/80  Pulse: (!) 120 87  82  Resp: (!) 21 (!) 24 (!) 21 (!) 23  Temp:  98.3 F (36.8 C)  98.4 F (36.9 C)  TempSrc:    Oral  SpO2: 97%   100%  Weight:    66.5 kg  Height:        Intake/Output Summary (Last 24 hours) at 09/05/2021 0742 Last data filed at 09/04/2021 2223 Gross per 24 hour  Intake 39 ml  Output 1150 ml  Net -1111 ml      09/05/2021    4:53 AM 09/04/2021    3:52 AM 09/01/2021    3:58 AM  Last 3 Weights  Weight (lbs) 146 lb 9.7 oz 141 lb 1.5 oz 150 lb 9.2 oz  Weight (kg) 66.5 kg 64 kg 68.3 kg      Telemetry    Atrial fibrillation rate controlled - Personally Reviewed   Physical Exam   GEN: No acute distress.  Chronically ill appearing Neck: supple Cardiac: irregular Respiratory: diminished BS bases GI: Soft, nontender, non-distended  MS: trace to 1+ edema Neuro:  s/p CVA Psych: Normal affect   Labs    High Sensitivity Troponin:   Recent Labs  Lab 08/26/21 1428 08/26/21 1700  TROPONINIHS 24* 22*     Chemistry Recent Labs  Lab 09/03/21 0735 09/04/21 0746 09/05/21 0155  NA 123* 124* 123*  K 4.9 4.8 4.5  CL 86* 86* 85*  CO2 28 28 28   GLUCOSE 128* 107* 146*  BUN 28* 31* 35*  CREATININE 0.74  0.71 0.76  CALCIUM 8.2* 8.4* 8.2*  MG  --   --  1.9  GFRNONAA >60 >60 >60  ANIONGAP 9 10 10      Hematology Recent Labs  Lab 08/30/21 0203 08/31/21 0139 09/01/21 0439  WBC 9.9 8.6 8.8  RBC 3.08* 2.88* 3.06*  HGB 9.2* 8.7* 9.1*  HCT 28.3* 26.7* 28.1*  MCV 91.9 92.7 91.8  MCH 29.9 30.2 29.7  MCHC 32.5 32.6 32.4  RDW 16.2* 16.0* 15.9*  PLT 249 233 246     Radiology    CT CHEST WO CONTRAST  Result Date: 09/04/2021 CLINICAL DATA:  Pneumonia.  Complication suspected. EXAM: CT CHEST WITHOUT CONTRAST TECHNIQUE: Multidetector CT imaging of the chest was performed following the standard protocol without IV contrast. RADIATION DOSE REDUCTION: This exam was performed according to the departmental dose-optimization program which includes automated exposure control, adjustment of the mA and/or kV according to patient size and/or use of iterative reconstruction technique. COMPARISON:  Plain film of earlier in the morning. CTA chest of 08/26/2021 FINDINGS: Cardiovascular: Mild motion degradation throughout. Exam also degraded by patient arm position, not raised above the head.  Aortic atherosclerosis. Tortuous thoracic aorta. Moderate cardiomegaly, without pericardial effusion. Left main and 3 vessel coronary artery calcification. Aortic valve calcification. Pulmonary artery enlargement, outflow tract 3.2 cm Mediastinum/Nodes: Borderline adenopathy within the azygoesophageal recess at 10 mm is similar and favored to be reactive. Hilar regions poorly evaluated without intravenous contrast. Lungs/Pleura: Moderate bilateral pleural effusions. Similar on the left and slightly increased on the right. Persistent collapse/consolidation within both lower lobes, greater left than right. Worsened upper lobe aeration. Increase in multifocal bilateral upper lobe ground-glass opacities with concurrent septal thickening. Increased lingular airspace disease which is likely atelectasis. Upper Abdomen: Normal imaged  portions of the liver, spleen. Musculoskeletal: Anasarca. Mild T12 compression deformity is similar. IMPRESSION: 1. Motion and patient position degraded exam. 2. Worsened aeration since the prior CT, secondary to increased upper lobe ground-glass opacities, favoring pulmonary edema. Atypical infection could look similar. 3. Moderate bilateral pleural effusions with bibasilar airspace disease, likely atelectasis. At the left lung base, lobar pneumonia or aspiration cannot be excluded. 4. Coronary artery atherosclerosis. Aortic Atherosclerosis (ICD10-I70.0). 5. Pulmonary artery enlargement suggests pulmonary arterial hypertension. 6. Aortic valvular calcifications. Consider echocardiography to evaluate for valvular dysfunction. Electronically Signed   By: Jeronimo Greaves M.D.   On: 09/04/2021 08:28   DG CHEST PORT 1 VIEW  Result Date: 09/04/2021 CLINICAL DATA:  Shortness of breath EXAM: PORTABLE CHEST 1 VIEW COMPARISON:  Previous studies including the examination of 08/26/2021 FINDINGS: Transverse diameter of the heart is increased. There is interval decrease in pulmonary vascular congestion. There is increased opacity in left mid and left lower lung fields suggesting increase in amount of left pleural effusion. Evaluation for atelectasis/pneumonia in the left mid and left lower lung fields is limited by the effusion. Small right pleural effusion is noted. There is no pneumothorax. Dense calcification is seen in mitral annulus. IMPRESSION: There is interval decrease in pulmonary vascular congestion. There is interval increase in left pleural effusion obscuring left mid and left lower lung fields. Small right pleural effusion. Electronically Signed   By: Ernie Avena M.D.   On: 09/04/2021 08:24     Patient Profile     Gwendolyn [name] is a 86 y.o. female with recent right MCA stroke (May 2023) status post PEG placement, persistent atrial fibrillation, hypertension, hyperlipidemia, HFpEF who was admitted on  08/26/2021 with shortness of breath and peripheral edema as well as atrial fibrillation.  Cardiology was consulted for recommendations regarding volume overload and A-fib.  Echocardiogram July 2023 showed normal LV function, mild right ventricular dysfunction, mild to moderate left atrial enlargement, mild right atrial enlargement, moderate pericardial effusion with no evidence of tamponade.  Assessment & Plan    1 persistent atrial fibrillation-heart rate appears to be controlled.  Continue metoprolol and apixaban.  Note based on criteria she should be on 5 mg twice daily as she weighs greater than 60 kg and creatinine is less than 1.5.  We will leave to pharmacy for further dosing.  Not a candidate to consider cardioversion.  2 chronic diastolic congestive heart failure-r appears mildly volume overloaded.  Continue Lasix at present dose and follow renal function.  3 hyponatremia-Free water has been discontinued.  Primary care managing.  4 status post recent CVA-appears to be debilitated.  Palliative care evaluation would be appropriate.  5 pericardial fusion-appears to be small on my review.  Would likely repeat study in 4 to 6 weeks.  For questions or updates, please contact [facility] Please consult [email or web address] for contact info under  Signed, Olga Millers, MD  09/05/2021, 7:42 AM

## 2021-09-06 DIAGNOSIS — G9341 Metabolic encephalopathy: Secondary | ICD-10-CM | POA: Diagnosis not present

## 2021-09-06 DIAGNOSIS — E871 Hypo-osmolality and hyponatremia: Secondary | ICD-10-CM | POA: Diagnosis not present

## 2021-09-06 DIAGNOSIS — I4891 Unspecified atrial fibrillation: Secondary | ICD-10-CM | POA: Diagnosis not present

## 2021-09-06 DIAGNOSIS — Z7189 Other specified counseling: Secondary | ICD-10-CM

## 2021-09-06 DIAGNOSIS — I5033 Acute on chronic diastolic (congestive) heart failure: Secondary | ICD-10-CM | POA: Diagnosis not present

## 2021-09-06 LAB — BASIC METABOLIC PANEL
Anion gap: 8 (ref 5–15)
BUN: 39 mg/dL — ABNORMAL HIGH (ref 8–23)
CO2: 30 mmol/L (ref 22–32)
Calcium: 8 mg/dL — ABNORMAL LOW (ref 8.9–10.3)
Chloride: 86 mmol/L — ABNORMAL LOW (ref 98–111)
Creatinine, Ser: 0.8 mg/dL (ref 0.44–1.00)
GFR, Estimated: >60 mL/min (ref >60)
Glucose, Bld: 122 mg/dL — ABNORMAL HIGH (ref 70–99)
Potassium: 4.1 mmol/L (ref 3.5–5.1)
Sodium: 124 mmol/L — ABNORMAL LOW (ref 135–145)

## 2021-09-06 LAB — GLUCOSE, CAPILLARY
Glucose-Capillary: 102 mg/dL — ABNORMAL HIGH (ref 70–99)
Glucose-Capillary: 120 mg/dL — ABNORMAL HIGH (ref 70–99)
Glucose-Capillary: 122 mg/dL — ABNORMAL HIGH (ref 70–99)
Glucose-Capillary: 169 mg/dL — ABNORMAL HIGH (ref 70–99)
Glucose-Capillary: 93 mg/dL (ref 70–99)
Glucose-Capillary: 95 mg/dL (ref 70–99)

## 2021-09-06 MED ORDER — ORAL CARE MOUTH RINSE: 15.0000 mL | OROMUCOSAL | Status: DC | PRN

## 2021-09-06 MED ORDER — EMPAGLIFLOZIN 10 MG PO TABS
10.0000 mg | ORAL_TABLET | Freq: Every day | ORAL | Status: DC
  Administered 2021-09-06 – 2021-09-09 (×4): 10 mg via ORAL
  Filled 2021-09-06 (×4): qty 1

## 2021-09-06 NOTE — Consult Note (Signed)
Consultation Note Date: 09/06/2021   [name]  DOB: 1936-01-27  MRN: [id number]  Age / Sex: 86 y.o., female  PCP: Gwendolyn Morning, DO Referring Physician: Tawni [name]  Reason for Consultation: Establishing goals of care  HPI/Patient Profile: 86 y.o. female  with past medical history of ersistent atrial fibrillation, hypertension, hyperlipidemia and recent right MCA stroke with hemorrhagic transformation 06/2021 (S/P PEG 5/30) admitted on 08/26/2021 with shortness of breath and peripheral edema.   Patient was admitted for acute on chronic heart failure and A-fib with RVR.  PMT has been consulted to assist with goals of care conversation.  Clinical Assessment and Goals of Care:  I have reviewed medical records including EPIC notes, labs and imaging, received report from RN, assessed the patient and then called patient's niece Gwendolyn [name] to discuss diagnosis prognosis, Gwendolyn [name], EOL wishes, disposition and options.  I introduced Palliative Medicine as specialized medical care for people living with serious illness. It focuses on providing relief from the symptoms and stress of a serious illness. The goal is to improve quality of life for both the patient and the family.  We discussed a brief life review of the patient and then focused on their current illness.  The natural disease trajectory and expectations at EOL were discussed.  I attempted to elicit values and goals of care important to the patient.    Medical History Review and Understanding:  Patient's niece and I thoroughly discussed her chronic comorbidities and current management of her acute illness which is improving.  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  Discussed limited treatment options, as she is not a candidate for cardioversion.  Social History: Patient resides at [name].  Her niece tells me that she does not  have a home, was living in a motel previously.  Niece Gwendolyn [name] is in the process of applying for Medicaid.  Patient greatly trusts Gwendolyn [name] who resides in [place] and visits in person when able.  Functional and Nutritional State: Patient is Gwendolyn [name] lifted to her wheelchair, does not ambulate.  She receives all of her nutrition via PEG tube.  Her niece tells me she has had a tough 15 years, with worsening decline over the past few months since her stroke.  Palliative Symptoms: Patient reports right ear pain  Advance Directives: A detailed discussion regarding advanced directives was had.  Patient's niece Gwendolyn [name] reports she is durable POA and HCPOA.   Code Status: concepts specific to code status, artifical feeding and hydration, and rehospitalization were considered and discussed. Recommended consideration of DNR status, understanding evidenced-based poor outcomes in similar hospitalized patients, as the cause of the arrest is likely associated with chronic/terminal disease rather than a reversible acute cardio-pulmonary event.  Patient's HCPOA is agreeable to changing CODE STATUS today.  Discussion: Patient's niece Gwendolyn [name] has a good understanding of the severity of patient's illness.  She has realized since patient's stroke that Gwendolyn [name] is at high risk for decline, as she has already had poor functioning at baseline.  After her stroke, Gwendolyn [name] and Gwendolyn [name] met with an attorney at which time Gwendolyn [name] seemed to begin to realize severity of her illness as well.  For the longest time, she has not been very realistic and has wanted ongoing aggressive care "at all costs."  During this meeting she began to shift her statements, stating that she fully trusts Gwendolyn [name] to make decisions on her behalf whenever she thinks it is time to shift focus and accept there is nothing further that can  be done for improvement.  This is all Gwendolyn to [place]wendolyn [name], however she is clear she does not want the patient to suffer if her quality of  life is not meaningful.  She has noticed the patient is becoming more tired and she is prepared to make difficult choices, as she loves her aunt and does not want her to suffer anymore that she currently is.  She is very appreciative of ongoing palliative support for goals of care conversation based on how she does when she returns to [name].   The difference between aggressive medical intervention and comfort care was considered in light of the patient's goals of care. Hospice and Palliative Care services outpatient were explained and offered.   Discussed the importance of continued conversation with family and the medical providers regarding overall plan of care and treatment options, ensuring decisions are within the context of the patient's values and GOCs.   Questions and concerns were addressed.  The family was encouraged to call with questions or concerns.  PMT will continue to support holistically.    SUMMARY OF RECOMMENDATIONS   -Change code status to DNR after discussion with HCPOA/niece Gwendolyn [name]   -Continue current care, goal is to return to [place] [name] for referral to outpatient palliative care, assistance appreciated -Niece is very realistic and would transition to hospice if patient declines further despite ongoing medical interventions -PMT will continue to follow and support  Prognosis:  Guarded  Discharge Planning: Gwendolyn [name] for rehab with Palliative care service follow-up      Primary Diagnoses: Present on Admission:  Atrial fibrillation with rapid ventricular response ([place]yn [name])  Hyponatremia  Mixed hyperlipidemia  Normocytic anemia  Acute on chronic diastolic CHF (congestive heart failure) (Gwendolyn [name])  Acute metabolic encephalopathy  Hypothyroidism    Physical Exam Vitals and nursing note reviewed.  Constitutional:      General: She is sleeping. She is not in acute distress.    Appearance: She is ill-appearing.     Interventions: Face  mask in place.  Cardiovascular:     Rate and Rhythm: Normal rate.  Pulmonary:     Effort: Pulmonary effort is normal.  Skin:    General: Skin is Gwendolyn and dry.  Neurological:     Comments: Oriented x2     Vital Signs: BP 113/66 (BP Location: Right Arm)   Pulse 77   Temp 98.2 F (36.8 C) (Oral)   Resp 16   Ht 5' 1"  (1.549 m)   Wt 67 kg   SpO2 99%   BMI 27.91 kg/m  Pain Scale: Faces   Pain Score: Asleep   SpO2: SpO2: 99 % O2 Device:SpO2: 99 % O2 Flow Rate: .O2 Flow Rate (L/min): 2 L/min   Palliative Assessment/Data: 30% (PEG)      MDM: High  Gardy Montanari Johnnette Litter, PA-C  Palliative Medicine Team Team phone # [phone]  Thank you for allowing the Palliative Medicine Team to assist in the care of this patient. Please utilize secure chat with additional questions, if there is no response within 30 minutes please call the above phone number.  Palliative Medicine Team providers are available by phone from 7am to 7pm daily and can be reached through the team cell phone.  Should this patient require assistance outside of these hours, please call the patient's attending physician.

## 2021-09-06 NOTE — Progress Notes (Signed)
Progress Note   Patient: Gwendolyn [name] KGM:[id number] DOB: [birth year] DOA: 08/26/2021     11 DOS: the patient was seen and examined on 09/06/2021   Brief hospital course: [name] was admitted to the hospital with the working diagnosis of atrial fibrillation with RVR.   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 Patient has been on feeding tubed per PEG tube for nutritional support. Reported worsening dyspnea for several days, associated with lower extremity edema. Because of persistent symptoms EMS was called and patient was transported to the ED. On her initial physical examination she was in atrial fibrillation with RVR and in respiratory distress. Placed on Bipap and received IV furosemide. Blood pressure 99.67, HR 113, RR 35 02 saturation 99%, lungs with rales bilaterally, decreased breath sounds and increased work of breathing, heart with S1 and S2 present, irregularly irregular, abdomen not distended and positive lower extremity edema.   Na 123, K 4,4 CL 86, bicarbonate 22, glucose 98 bun 30 cr 0,90  Wbc 10,2 hgb 8,6 plt 240  Sars covid 19 negative  Urine analysis with SG 1,012 negative leukocytes, negative protein.   Head CT with no acute changes.   Chest radiograph with cardiomegaly with bilateral interstitial infiltrates with bilateral small pleural effusions.   EKG 129 bpm, normal axis, atrial fibrillation rhythm with no significant ST segment or T wave changes.   Patient was placed on furosemide for diuresis.  Added digoxin for rate control, along with amiodarone. Because elevated TSH amiodarone was discontinued and patient was transitioned to metoprolol.  Elevated dig levels, and digoxin was discontinued.   Patient continue volume overloaded, slowly improving to diuresis.  Hyponatremia.   Assessment and Plan: * Atrial fibrillation with rapid ventricular response  (HCC) Heart rate has improved.  Continue rate control with metoprolol 50 mg per tube tid.  Continue anticoagulation with apixaban.    Acute on chronic diastolic CHF (congestive heart failure) (HCC) Echocardiogram with preserved LV systolic function with EF 50 to 55%, with mild reduced RV systolic function. RVSP 32.4  Pericardial effusion.   CT chest with bilateral ground glass opacities.  Bilateral pleural effusions and compression atelectasis.   Documented urine output 1700 ml Systolic blood pressure 108 to 116 mmHg.  07/27 metolazone 2,5 mg  Clinically improving, plan to continue diuresis with IV furosemide. Will add SGLT 2 inh.    Acute metabolic encephalopathy Multifactorial encephalopathy in the setting of CVA with hemorrhagic conversion.   Positive paresis from recent CVA, more left sided than right.  Continue nutrition per tube feedings.  Mentation seems to be improving.   Hyponatremia Hypomagnesemia.   Patient with improvement in her hypervolemia.  Renal function today with serum cr at 0.80 with K at 4,1 and serum bicarbonate at 30,  Na is 124.   Plan to continue diuresis and follow up renal function and electrolytes in am.    Normocytic anemia SP 1 unit PRBC transfusion Hgb has been stable with no signs of bleeding  Continue anticoagulation with apixaban.   Mixed hyperlipidemia Continue statin via PEG tube  Hypothyroidism Continue with levothyroxine         Subjective: Patient more awake and alert, volume status has been improving.   Physical Exam: Vitals:   09/06/21 0438 09/06/21 0500 09/06/21 0621 09/06/21 0751  BP: 107/61  120/64 113/66  Pulse: 84  95 77  Resp: 19   16  Temp: 98.4 F (36.9 C)  98.2 F (36.8 C)  TempSrc: Oral   Oral  SpO2: 99%     Weight:  67 kg    Height:       Neurology awake and alert, continue with left hemiparesis ENT with pallor Cardiovascular with S1 and S2 present irregularly irregular with no gallops  or  rubs Respiratory with scattered rales and wheezing Abdomen with peg tube in place not distended Trace lower extremity edema  Data Reviewed:    Family Communication: I was note able to reach her niece over the phone. Left a message.   Disposition: Status is: Inpatient Remains inpatient appropriate because: heart failure   Planned Discharge Destination: Skilled nursing facility    Author: Coralie Keens, MD 09/06/2021 11:00 AM  For on call review [email or web address].

## 2021-09-06 NOTE — Progress Notes (Signed)
Progress Note  Patient Name: Favour Aleshire Date of Encounter: 09/06/2021  [facility] Cardiologist: Chrystie Nose, MD   Subjective   No chest pain.  Mildly dyspneic.  Inpatient Medications    Scheduled Meds:  apixaban  2.5 mg Per Tube BID   atorvastatin  40 mg Per Tube Daily   feeding supplement (OSMOLITE 1.5 CAL)  300 mL Per Tube TID PC & HS   furosemide  40 mg Intravenous BID   levothyroxine  100 mcg Per Tube Q0600   metoprolol tartrate  25 mg Per Tube TID   Continuous Infusions:  PRN Meds: acetaminophen **OR** acetaminophen, ipratropium-albuterol, ondansetron **OR** ondansetron (ZOFRAN) IV, mouth rinse, oxyCODONE   Vital Signs    Vitals:   09/06/21 0438 09/06/21 0500 09/06/21 0621 09/06/21 0751  BP: 107/61  120/64 113/66  Pulse: 84  95 77  Resp: 19   16  Temp: 98.4 F (36.9 C)   98.2 F (36.8 C)  TempSrc: Oral   Oral  SpO2: 99%     Weight:  67 kg    Height:        Intake/Output Summary (Last 24 hours) at 09/06/2021 1003 Last data filed at 09/06/2021 7124 Gross per 24 hour  Intake 410 ml  Output 1700 ml  Net -1290 ml       09/06/2021    5:00 AM 09/05/2021    4:53 AM 09/04/2021    3:52 AM  Last 3 Weights  Weight (lbs) 147 lb 11.3 oz 146 lb 9.7 oz 141 lb 1.5 oz  Weight (kg) 67 kg 66.5 kg 64 kg      Telemetry    Atrial fibrillation-personally reviewed   Physical Exam   GEN: Chronically ill-appearing HEENT: normal  Neck: no JVD, carotid bruits, or masses Cardiac: irregular; no murmurs, rubs, or gallops, 1+ edema  Respiratory:  clear to auscultation bilaterally, normal work of breathing GI: soft, nontender, nondistended, + BS MS: no deformity or atrophy  Skin: warm and dry Neuro:  Strength and sensation are intact Psych: euthymic mood, full affect   Labs    High Sensitivity Troponin:   Recent Labs  Lab 08/26/21 1428 08/26/21 1700  TROPONINIHS 24* 22*      Chemistry Recent Labs  Lab 09/04/21 0746 09/05/21 0155  09/06/21 0134  NA 124* 123* 124*  K 4.8 4.5 4.1  CL 86* 85* 86*  CO2 28 28 30   GLUCOSE 107* 146* 122*  BUN 31* 35* 39*  CREATININE 0.71 0.76 0.80  CALCIUM 8.4* 8.2* 8.0*  MG  --  1.9  --   GFRNONAA >60 >60 >60  ANIONGAP 10 10 8       Hematology Recent Labs  Lab 08/31/21 0139 09/01/21 0439  WBC 8.6 8.8  RBC 2.88* 3.06*  HGB 8.7* 9.1*  HCT 26.7* 28.1*  MCV 92.7 91.8  MCH 30.2 29.7  MCHC 32.6 32.4  RDW 16.0* 15.9*  PLT 233 246      Radiology    No results found.   Patient Profile     Krystyl Cannell is a 86 y.o. 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  Cardiology was consulted for recommendations regarding volume overload and A-fib.  Echocardiogram July 2023 showed normal LV function, mild right ventricular dysfunction, mild to moderate left atrial enlargement, mild right atrial enlargement, moderate pericardial  effusion with no evidence of tamponade.  Assessment & Plan    1.  Persistent atrial fibrillation: Heart rate appears to be well controlled.  Continue metoprolol and Eliquis.  Eliquis dosing per pharmacy.  Not a candidate for cardioversion.  2.  Chronic diastolic heart failure: Appears mildly volume overloaded.  Creatinine is remained stable.  We Yvonnia Tango continue with current diuresis.  3.  Hyponatremia: Managed by primary hospitalist.  4.  CVA: Appears to be significantly debilitated.  Agree with palliative evaluation.  5.  Pericardial effusion: Small on prior review.  Potentially need repeat echo in 4 to 6 weeks.  Cardiology to see as needed through the weekend.  Please call us back if further issues arise.   For questions or updates, please contact [facility] Please consult [email or web address] for contact info under        Signed, Ivo Moga Jorja Loa, MD  09/06/2021, 10:03 AM

## 2021-09-07 DIAGNOSIS — I5033 Acute on chronic diastolic (congestive) heart failure: Secondary | ICD-10-CM | POA: Diagnosis not present

## 2021-09-07 DIAGNOSIS — I4891 Unspecified atrial fibrillation: Secondary | ICD-10-CM | POA: Diagnosis not present

## 2021-09-07 DIAGNOSIS — G9341 Metabolic encephalopathy: Secondary | ICD-10-CM | POA: Diagnosis not present

## 2021-09-07 DIAGNOSIS — E871 Hypo-osmolality and hyponatremia: Secondary | ICD-10-CM | POA: Diagnosis not present

## 2021-09-07 LAB — BASIC METABOLIC PANEL
Anion gap: 7 (ref 5–15)
BUN: 47 mg/dL — ABNORMAL HIGH (ref 8–23)
CO2: 34 mmol/L — ABNORMAL HIGH (ref 22–32)
Calcium: 8.1 mg/dL — ABNORMAL LOW (ref 8.9–10.3)
Chloride: 85 mmol/L — ABNORMAL LOW (ref 98–111)
Creatinine, Ser: 0.85 mg/dL (ref 0.44–1.00)
GFR, Estimated: >60 mL/min (ref >60)
Glucose, Bld: 105 mg/dL — ABNORMAL HIGH (ref 70–99)
Potassium: 4.5 mmol/L (ref 3.5–5.1)
Sodium: 126 mmol/L — ABNORMAL LOW (ref 135–145)

## 2021-09-07 LAB — GLUCOSE, CAPILLARY
Glucose-Capillary: 106 mg/dL — ABNORMAL HIGH (ref 70–99)
Glucose-Capillary: 108 mg/dL — ABNORMAL HIGH (ref 70–99)
Glucose-Capillary: 139 mg/dL — ABNORMAL HIGH (ref 70–99)
Glucose-Capillary: 161 mg/dL — ABNORMAL HIGH (ref 70–99)
Glucose-Capillary: 194 mg/dL — ABNORMAL HIGH (ref 70–99)

## 2021-09-07 NOTE — Progress Notes (Addendum)
Progress Note   Patient: Gwendolyn [name] NWG:[id number] DOB: Oct 03, 1935 DOA: 08/26/2021     12 DOS: the patient was seen and examined on 09/07/2021   Brief hospital course: Gwendolyn [name] was admitted to the hospital with the working diagnosis of atrial fibrillation with RVR.   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 Patient has been on feeding tubed per PEG tube for nutritional support. Reported worsening dyspnea for several days, associated with lower extremity edema. Because of persistent symptoms EMS was called and patient was transported to the ED. On her initial physical examination she was in atrial fibrillation with RVR and in respiratory distress. Placed on Bipap and received IV furosemide. Blood pressure 99.67, HR 113, RR 35 02 saturation 99%, lungs with rales bilaterally, decreased breath sounds and increased work of breathing, heart with S1 and S2 present, irregularly irregular, abdomen not distended and positive lower extremity edema.   Na 123, K 4,4 CL 86, bicarbonate 22, glucose 98 bun 30 cr 0,90  Wbc 10,2 hgb 8,6 plt 240  Sars covid 19 negative  Urine analysis with SG 1,012 negative leukocytes, negative protein.   Head CT with no acute changes.   Chest radiograph with cardiomegaly with bilateral interstitial infiltrates with bilateral small pleural effusions.   EKG 129 bpm, normal axis, atrial fibrillation rhythm with no significant ST segment or T wave changes.   Patient was placed on furosemide for diuresis.  Added digoxin for rate control, along with amiodarone. Because elevated TSH amiodarone was discontinued and patient was transitioned to metoprolol.  Elevated dig levels, and digoxin was discontinued.   Patient continue volume overloaded, slowly improving to diuresis.  Hyponatremia.   Assessment and Plan: * Atrial fibrillation with rapid ventricular response  (HCC) Heart rate has improved.  Continue rate control with metoprolol 25 mg per tube tid.  Continue anticoagulation with apixaban.    Acute on chronic diastolic CHF (congestive heart failure) (HCC) Echocardiogram with preserved LV systolic function with EF 50 to 55%, with mild reduced RV systolic function. RVSP 32.4  Pericardial effusion.   CT chest with bilateral ground glass opacities.  Bilateral pleural effusions and compression atelectasis.   Documented urine output 600 ml (question if accurate).  Systolic blood pressure 112 to 120 mmHg.  07/27 metolazone 2,5 mg  Clinically improving, plan to continue diuresis with IV furosemide. Continue with SGLT 2 inh empagliflozin.    Acute metabolic encephalopathy Multifactorial encephalopathy in the setting of CVA with hemorrhagic conversion.   Positive paresis from recent CVA, more left sided than right.  Continue nutrition per tube feedings.  Mentation seems to be improving.   Hyponatremia Hypomagnesemia.   With diuresis her serum Na is improving, today at 126, K is 4,5 and serum bicarbonate at 34. Continue to hold on free water flushes per enteral tube.   Continue furosemide and empagliflozin Follow up renal function and electrolytes in 48 hrs     Normocytic anemia SP 1 unit PRBC transfusion Hgb has been stable with no signs of bleeding  Continue anticoagulation with apixaban.   Mixed hyperlipidemia Continue statin via PEG tube  Hypothyroidism Continue with levothyroxine         Subjective: Patient has been stable, her mentation continue to improve, she is tolerating well tube feedings.   Physical Exam: Vitals:   09/07/21 0600 09/07/21 0700 09/07/21 0800 09/07/21 0910  BP:    111/67  Pulse: 73 88 88 80  Resp: 17 18 19 13   Temp:    97.9 F (36.6 C)  TempSrc:    Oral  SpO2: 100% 100% 100% 100%  Weight:      Height:       Neurology awake and alert, persistent left sided hemiparesis Able to respond to  questions ENT with mild pallor Cardiovascular with S1 and S2 present, irregularly irregular with no gallops Respiratory with no rales or wheezing Abdomen with no distention  Left upper extremity is improving, trace lower extremity edema  Data Reviewed:    Family Communication: no family at the bedside   Disposition: Status is: Inpatient Remains inpatient appropriate because: hyponatremia and heart failure.   Planned Discharge Destination: Skilled nursing facility    Author: , MD 09/07/2021 10:10 AM  For on call review [email or web address].

## 2021-09-07 NOTE — Progress Notes (Signed)
Daily Progress Note   [name]       Date: 09/07/2021 DOB: November 03, 1935  Age: 86 y.o. MRN#: [id number] Attending Physician: Coralie Keens Primary Care Physician: Irena Reichmann, DO Admit Date: 08/26/2021  Reason for Consultation/Follow-up: Establishing goals of care  Subjective: Medical records reviewed including progress notes, labs. Patient assessed at the bedside.  She reports continued ear pain, no other concerns.  No family present during my visit.  Provided patient with update on my conversation with her niece yesterday.  She confirms her immense trust in [place] to make decisions on her behalf and support her through her illness.  She notes her care team thinks her breathing "is not that great" but is unable to tell me how she is experiencing her breathing and whether this is distressing to her.  Discussed current care plan with ongoing diuresis and slow improvement.  [name] tells me she has not thought about CODE STATUS much in the past.  I explained rationale for update to DNR status after discussion with her niece, understanding evidenced-based poor outcomes in similar hospitalized patients, as the cause of the arrest is likely associated with chronic/terminal disease rather than a reversible acute cardio-pulmonary event.  She does not say much about this, but agrees it is a difficult topic to consider.  She also agrees it would be nice to have ongoing palliative support when she returns to [place].  Called patient's niece [name] to provide updates and palliative support.  She is glad to know that patient is aware of her decision on CODE STATUS and does not seem upset by this.  I informed her that I will be off service until Thursday and that PMT will be  available as needed in the meantime.  Encouraged to call with additional needs or concerns.  Questions and concerns addressed. PMT will continue to support holistically.   Length of Stay: 12   Physical Exam Vitals and nursing note reviewed.  Constitutional:      General: She is not in acute distress.    Appearance: She is ill-appearing.  Cardiovascular:     Rate and Rhythm: Normal rate.  Pulmonary:     Effort: Pulmonary effort is normal. Tachypnea present.  Skin:    General: Skin is warm and dry.  Neurological:     Mental  Status: She is alert.  Psychiatric:        Mood and Affect: Affect is flat.             Vital Signs: BP 111/67 (BP Location: Right Arm)   Pulse 80   Temp 97.9 F (36.6 C) (Oral)   Resp 13   Ht 5\' 1"  (1.549 m)   Wt 53.9 kg   SpO2 100%   BMI 22.45 kg/m  SpO2: SpO2: 100 % O2 Device: O2 Device: Nasal Cannula O2 Flow Rate: O2 Flow Rate (L/min): 2 L/min      Palliative Assessment/Data: 30% (TF)     Palliative Care Assessment & Plan   Patient Profile: 86 y.o. female  with past medical history of ersistent atrial fibrillation, hypertension, hyperlipidemia and recent right MCA stroke with hemorrhagic transformation 06/2021 (S/P PEG 5/30) admitted on 08/26/2021 with shortness of breath and peripheral edema.    Patient was admitted for acute on chronic heart failure and A-fib with RVR.  PMT has been consulted to assist with goals of care conversation.    Assessment: Goals of care conversation A-fib with RVR, improved Acute on chronic diastolic CHF, slowly improving Acute metabolic encephalopathy, improving  Recommendations/Plan: Continue DNR, Gold form signed and placed in hard chart for return to facility.  Will scan copy into EMR Continue current care Psychosocial emotional support provided Goal remains to discharge back to SNF with outpatient palliative support PMT will continue to follow peripherally, may not see daily.  Please call team line or  secure chat with urgent needs   Prognosis: Guarded  Discharge Planning: SNF with outpatient palliative follow-up  Care plan was discussed with patient, patient's niece   Total time: I spent 35 minutes in the care of the patient today in the above activities and documenting the encounter.   Kashlynn Kundert 08/28/2021, PA-C  Palliative Medicine Team Team phone # [phone]  Thank you for allowing the Palliative Medicine Team to assist in the care of this patient. Please utilize secure chat with additional questions, if there is no response within 30 minutes please call the above phone number.  Palliative Medicine Team providers are available by phone from 7am to 7pm daily and can be reached through the team cell phone.  Should this patient require assistance outside of these hours, please call the patient's attending physician.

## 2021-09-08 DIAGNOSIS — E871 Hypo-osmolality and hyponatremia: Secondary | ICD-10-CM | POA: Diagnosis not present

## 2021-09-08 DIAGNOSIS — I5033 Acute on chronic diastolic (congestive) heart failure: Secondary | ICD-10-CM | POA: Diagnosis not present

## 2021-09-08 DIAGNOSIS — I4891 Unspecified atrial fibrillation: Secondary | ICD-10-CM | POA: Diagnosis not present

## 2021-09-08 DIAGNOSIS — G9341 Metabolic encephalopathy: Secondary | ICD-10-CM | POA: Diagnosis not present

## 2021-09-08 LAB — GLUCOSE, CAPILLARY
Glucose-Capillary: 135 mg/dL — ABNORMAL HIGH (ref 70–99)
Glucose-Capillary: 171 mg/dL — ABNORMAL HIGH (ref 70–99)
Glucose-Capillary: 180 mg/dL — ABNORMAL HIGH (ref 70–99)
Glucose-Capillary: 206 mg/dL — ABNORMAL HIGH (ref 70–99)
Glucose-Capillary: 96 mg/dL (ref 70–99)

## 2021-09-08 LAB — BASIC METABOLIC PANEL
Anion gap: 10 (ref 5–15)
BUN: 52 mg/dL — ABNORMAL HIGH (ref 8–23)
CO2: 32 mmol/L (ref 22–32)
Calcium: 8.6 mg/dL — ABNORMAL LOW (ref 8.9–10.3)
Chloride: 88 mmol/L — ABNORMAL LOW (ref 98–111)
Creatinine, Ser: 1.06 mg/dL — ABNORMAL HIGH (ref 0.44–1.00)
GFR, Estimated: 52 mL/min — ABNORMAL LOW (ref >60)
Glucose, Bld: 166 mg/dL — ABNORMAL HIGH (ref 70–99)
Potassium: 4.7 mmol/L (ref 3.5–5.1)
Sodium: 130 mmol/L — ABNORMAL LOW (ref 135–145)

## 2021-09-08 MED ORDER — FUROSEMIDE 40 MG PO TABS
40.0000 mg | ORAL_TABLET | Freq: Every day | ORAL | Status: DC
  Administered 2021-09-08 – 2021-09-09 (×2): 40 mg
  Filled 2021-09-08 (×2): qty 1

## 2021-09-08 NOTE — TOC Progression Note (Signed)
Transition of Care [facility]) - Progression Note    Patient Details  Name: Gwendolyn [name] MRN: [id number] Date of Birth: 10-Mar-1935  Transition of Care [facility]) CM/SW Contact  Delilah Shan, LCSWA Phone Number: 09/08/2021, 12:51 PM  Clinical Narrative:     CSW spoke with Gwendolyn [name] with [facility] who confirmed they can accept patient when medically ready for dc. CSW received consult for referral for palliative services to follow patient at SNF. CSW spoke with Gwendolyn [name] who confirmed they can provide in house palliative services for patient. CSW spoke patients niece and patients niece agreed to in house palliative services for patient at [facility]. CSW informed Gwendolyn [name] with [facility]. CSW will continue to follow and assist with patients dc planning needs.   Expected Discharge Plan: Skilled Nursing Facility Barriers to Discharge: Continued Medical Work up  Expected Discharge Plan and Services Expected Discharge Plan: Skilled Nursing Facility In-house Referral: Clinical Social Work     Living arrangements for the past 2 months: [facility] [place] (From [place] Long term)                                       Social Determinants of Health (SDOH) Interventions    Readmission Risk Interventions     No data to display

## 2021-09-08 NOTE — Progress Notes (Addendum)
Progress Note  [name] Date of Encounter: 09/08/2021  [facility] Cardiologist: Chrystie Nose, MD   Subjective   Pt answers questions. Only complaint is her right ear with wound.   Inpatient Medications    Scheduled Meds:  apixaban  2.5 mg Per Tube BID   atorvastatin  40 mg Per Tube Daily   empagliflozin  10 mg Oral Daily   feeding supplement (OSMOLITE 1.5 CAL)  300 mL Per Tube TID PC & HS   furosemide  40 mg Intravenous BID   levothyroxine  100 mcg Per Tube Q0600   metoprolol tartrate  25 mg Per Tube TID   Continuous Infusions:  PRN Meds: acetaminophen **OR** acetaminophen, ipratropium-albuterol, ondansetron **OR** ondansetron (ZOFRAN) IV, mouth rinse, oxyCODONE   Vital Signs    Vitals:   09/07/21 2000 09/07/21 2154 09/07/21 2326 09/07/21 2330  BP: 129/60 129/60 (!) 110/53 (!) 110/53  Pulse:  79 93 78  Resp:   (!) 21 (!) 23  Temp: 99 F (37.2 C)  99 F (37.2 C)   TempSrc: Axillary  Axillary   SpO2:   97% 96%  Weight:      Height:        Intake/Output Summary (Last 24 hours) at 09/08/2021 0708 Last data filed at 09/07/2021 2300 Gross per 24 hour  Intake 300 ml  Output 500 ml  Net -200 ml      09/07/2021    4:32 AM 09/06/2021    5:00 AM 09/05/2021    4:53 AM  Last 3 Weights  Weight (lbs) 118 lb 13.3 oz 147 lb 11.3 oz 146 lb 9.7 oz  Weight (kg) 53.9 kg 67 kg 66.5 kg      Telemetry    Atrial fibrillation with ventricular rates in the 70-90s - Personally Reviewed  ECG    No new tracings - Personally Reviewed  Physical Exam   GEN: No acute distress.   Neck: exam difficult Cardiac: irregular rhythm, regular rate Respiratory: anterior lung exam with some rhonchi  GI: Soft, nontender, non-distended  MS: No edema Psych: Normal affect   Labs    High Sensitivity Troponin:   Recent Labs  Lab 08/26/21 1428 08/26/21 1700  TROPONINIHS 24* 22*     Chemistry Recent Labs  Lab 09/05/21 0155 09/06/21 0134 09/07/21 0308   NA 123* 124* 126*  K 4.5 4.1 4.5  CL 85* 86* 85*  CO2 28 30 34*  GLUCOSE 146* 122* 105*  BUN 35* 39* 47*  CREATININE 0.76 0.80 0.85  CALCIUM 8.2* 8.0* 8.1*  MG 1.9  --   --   GFRNONAA >60 >60 >60  ANIONGAP 10 8 7     Lipids No results for input(s): "CHOL", "TRIG", "HDL", "LABVLDL", "LDLCALC", "CHOLHDL" in the last 168 hours.  HematologyNo results for input(s): "WBC", "RBC", "HGB", "HCT", "MCV", "MCH", "MCHC", "RDW", "PLT" in the last 168 hours. Thyroid No results for input(s): "TSH", "FREET4" in the last 168 hours.  BNPNo results for input(s): "BNP", "PROBNP" in the last 168 hours.  DDimer No results for input(s): "DDIMER" in the last 168 hours.   Radiology    No results found.  Cardiac Studies   Echo 08/27/21: 1. Left ventricular ejection fraction, by estimation, is 50 to 55%. The  left ventricle has low normal function. The left ventricle has no regional  wall motion abnormalities. Diastolic function indeterminant due to Afib.   2. Right ventricular systolic function is mildly reduced. The right  ventricular size is  normal. There is normal pulmonary artery systolic  pressure. The estimated right ventricular systolic pressure is 32.4 mmHg.   3. Left atrial size was mild to moderately dilated.   4. Right atrial size was mildly dilated.   5. There is a moderate sized, circumferential perficardial effusion  present that appears largest around the RA/RV base. The pericardial  effusion is circumferential. There is no evidence of cardiac tamponade.  There is prominent epicardial fat present.   6. The mitral valve is degenerative. Trivial mitral valve regurgitation.  Severe mitral annular calcification.   7. The aortic valve is tricuspid. There is moderate calcification of the  aortic valve. There is moderate thickening of the aortic valve. Aortic  valve regurgitation is not visualized. Aortic valve  sclerosis/calcification is present, without any evidence  of aortic stenosis.    8. The inferior vena cava is dilated in size with >50% respiratory  variability, suggesting right atrial pressure of 8 mmHg.   Patient Profile     86 y.o. 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  Cardiology was consulted for recommendations regarding volume overload and A-fib.  Echocardiogram July 2023 showed normal LV function, mild right ventricular dysfunction, mild to moderate left atrial enlargement, mild right atrial enlargement, moderate pericardial effusion with no evidence of tamponade  Assessment & Plan    Persistent atrial fibrillation Chronic anticoagulation - amiodarone D/C'ed for elevated TSH levels - digoxin D/C'ed for elevated dig levels Continue metoprolol for rate control and eliquis - 2.5 mg BID Rate control strategy, not a candidate for cardioversion.   Chronic diastolic heart failure Diuresing on 40 mg IV lasix BID and jardiance. Na 126, BUN climbing.  Volumes status difficult.   Pericardial effusion Small on prior echo. Consider repeat echo in 4-6 weeks.    CVA  Significant debilitation Agree wit palliative care medicine     For questions or updates, please contact [facility] Please consult [email or web address] for contact info under      Signed, Marcelino Duster, PA  09/08/2021, 7:08 AM    History and all data above reviewed.  Patient examined.  I agree with the findings as above.  She denies pain or SOB.  The patient exam reveals [id number]  ,  Lungs: Decreased breath sounds  ,  Abd: Positive bowel sounds, no rebound no guarding, Ext Non pitting edema  .  All available labs, radiology testing, previous records reviewed. Agree with documented assessment and plan. Atrial fib:  Rate controlled.  On appropriate anticoagulation.  Continue current  therapy.  We will follow as needed.    Fayrene Fearing Sakia Schrimpf  12:30 PM  09/08/2021

## 2021-09-08 NOTE — Care Management Important Message (Signed)
Important Message  Patient Details  Name: Gwendolyn [name] MRN: [id number] Date of Birth: April 13, 1935   Medicare Important Message Given:  Yes     Renie Ora 09/08/2021, 12:48 PM

## 2021-09-08 NOTE — Progress Notes (Signed)
Progress Note   Patient: Gwendolyn [name] YKD:[id number] DOB: 02-20-1935 DOA: 08/26/2021     13 DOS: the patient was seen and examined on 09/08/2021   Brief hospital course: Gwendolyn [name] was admitted to the hospital with the working diagnosis of atrial fibrillation with RVR.   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 Patient has been on feeding tubed per PEG tube for nutritional support. Reported worsening dyspnea for several days, associated with lower extremity edema. Because of persistent symptoms EMS was called and patient was transported to the ED. On her initial physical examination she was in atrial fibrillation with RVR and in respiratory distress. Placed on Bipap and received IV furosemide. Blood pressure 99.67, HR 113, RR 35 02 saturation 99%, lungs with rales bilaterally, decreased breath sounds and increased work of breathing, heart with S1 and S2 present, irregularly irregular, abdomen not distended and positive lower extremity edema.   Na 123, K 4,4 CL 86, bicarbonate 22, glucose 98 bun 30 cr 0,90  Wbc 10,2 hgb 8,6 plt 240  Sars covid 19 negative  Urine analysis with SG 1,012 negative leukocytes, negative protein.   Head CT with no acute changes.   Chest radiograph with cardiomegaly with bilateral interstitial infiltrates with bilateral small pleural effusions.   EKG 129 bpm, normal axis, atrial fibrillation rhythm with no significant ST segment or T wave changes.   Patient was placed on furosemide for diuresis.  Added digoxin for rate control, along with amiodarone. Because elevated TSH amiodarone was discontinued and patient was transitioned to metoprolol.  Elevated dig levels, and digoxin was discontinued.   Patient continue volume overloaded, slowly improving to diuresis.  Hyponatremia and heart rate continue to improve.  Plan to transfer back to SNF tomorrow if  patient continue to be stable.   Assessment and Plan: * Atrial fibrillation with rapid ventricular response (HCC) Continue atrial fibrillation rhythm, but improved heart rate to 80s .  Continue rate control with metoprolol 25 mg per tube tid.  Continue anticoagulation with apixaban.    Acute on chronic diastolic CHF (congestive heart failure) (HCC) Echocardiogram with preserved LV systolic function with EF 50 to 55%, with mild reduced RV systolic function. RVSP 32.4  Pericardial effusion.   CT chest with bilateral ground glass opacities.  Bilateral pleural effusions and compression atelectasis.   Documented urine output 1,300 ml over last 24 hrs.  Systolic blood pressure 115 to 110 mmHg.  07/27 metolazone 2,5 mg  Continue with SGLT 2 inh Transition to enteral furosemide 40 mg daily  Holding on RAS inhibition due to risk of hypotension. Patient with poor prognosis due to neurologic impairment.   Acute metabolic encephalopathy Multifactorial encephalopathy in the setting of CVA with hemorrhagic conversion.   Positive paresis from recent CVA, more left sided than right.  Continue nutrition per tube feedings.  Mentation seems to be improving.  Patient with poor prognosis.   Hyponatremia Hypomagnesemia.   Volume status and serum Na are improving. Today serum Na is 130 with K at 4,7 and serum bicarbonate at 32. Cr is 1,0   Plan to continue furosemide and empagliflozin.  Normocytic anemia SP 1 unit PRBC transfusion Hgb has been stable with no signs of bleeding  Continue anticoagulation with apixaban.   Mixed hyperlipidemia Continue statin via PEG tube  Hypothyroidism Continue with levothyroxine         Subjective: Patient is feeling better, she is more reactive and tolerating  tube feedings well.   Physical Exam: Vitals:   09/08/21 0600 09/08/21 0755 09/08/21 1200 09/08/21 1212  BP: 115/62 (!) 110/57  (!) 115/51  Pulse:  92 82 85  Resp:  20  20  Temp: 97.9 F  (36.6 C) 98 F (36.7 C)  97.8 F (36.6 C)  TempSrc: Oral Oral  Oral  SpO2:  97% 100% 100%  Weight:      Height:       Neurology awake and alert, continue to have severe left hemiparesis ENT With mild pallor Cardiovascular with S1 and S2 present irregularly irregular with no gallops, rubs or murmurs Respiratory with scattered proximal rhonchi but no wheezing or rales. Abdomen not distended Left upper extremity edema is improving, no significant lower extremity edema  Data Reviewed:    Family Communication: no family at the bedside   Disposition: Status is: Inpatient Remains inpatient appropriate because: follow up on heart rate and serum Na.   Planned Discharge Destination: Skilled nursing facility tomorrow     Author: Coralie Keens, MD 09/08/2021 1:07 PM  For on call review [email or web address].

## 2021-09-09 DIAGNOSIS — I69328 Other speech and language deficits following cerebral infarction: Secondary | ICD-10-CM | POA: Diagnosis not present

## 2021-09-09 DIAGNOSIS — G9341 Metabolic encephalopathy: Secondary | ICD-10-CM | POA: Diagnosis not present

## 2021-09-09 DIAGNOSIS — I4819 Other persistent atrial fibrillation: Secondary | ICD-10-CM | POA: Diagnosis not present

## 2021-09-09 DIAGNOSIS — Z741 Need for assistance with personal care: Secondary | ICD-10-CM | POA: Diagnosis not present

## 2021-09-09 DIAGNOSIS — M6259 Muscle wasting and atrophy, not elsewhere classified, multiple sites: Secondary | ICD-10-CM | POA: Diagnosis not present

## 2021-09-09 DIAGNOSIS — E039 Hypothyroidism, unspecified: Secondary | ICD-10-CM | POA: Diagnosis not present

## 2021-09-09 DIAGNOSIS — Z7401 Bed confinement status: Secondary | ICD-10-CM | POA: Diagnosis not present

## 2021-09-09 DIAGNOSIS — E782 Mixed hyperlipidemia: Secondary | ICD-10-CM | POA: Diagnosis not present

## 2021-09-09 DIAGNOSIS — I4891 Unspecified atrial fibrillation: Secondary | ICD-10-CM | POA: Diagnosis not present

## 2021-09-09 DIAGNOSIS — R41841 Cognitive communication deficit: Secondary | ICD-10-CM | POA: Diagnosis not present

## 2021-09-09 DIAGNOSIS — I69354 Hemiplegia and hemiparesis following cerebral infarction affecting left non-dominant side: Secondary | ICD-10-CM | POA: Diagnosis not present

## 2021-09-09 DIAGNOSIS — R293 Abnormal posture: Secondary | ICD-10-CM | POA: Diagnosis not present

## 2021-09-09 DIAGNOSIS — I6340 Cerebral infarction due to embolism of unspecified cerebral artery: Secondary | ICD-10-CM | POA: Diagnosis not present

## 2021-09-09 DIAGNOSIS — I82612 Acute embolism and thrombosis of superficial veins of left upper extremity: Secondary | ICD-10-CM | POA: Diagnosis not present

## 2021-09-09 DIAGNOSIS — E871 Hypo-osmolality and hyponatremia: Secondary | ICD-10-CM | POA: Diagnosis not present

## 2021-09-09 DIAGNOSIS — D649 Anemia, unspecified: Secondary | ICD-10-CM | POA: Diagnosis not present

## 2021-09-09 DIAGNOSIS — M2550 Pain in unspecified joint: Secondary | ICD-10-CM | POA: Diagnosis not present

## 2021-09-09 DIAGNOSIS — M6281 Muscle weakness (generalized): Secondary | ICD-10-CM | POA: Diagnosis not present

## 2021-09-09 DIAGNOSIS — G9349 Other encephalopathy: Secondary | ICD-10-CM | POA: Diagnosis not present

## 2021-09-09 DIAGNOSIS — I5033 Acute on chronic diastolic (congestive) heart failure: Secondary | ICD-10-CM | POA: Diagnosis not present

## 2021-09-09 DIAGNOSIS — I69391 Dysphagia following cerebral infarction: Secondary | ICD-10-CM | POA: Diagnosis not present

## 2021-09-09 DIAGNOSIS — I619 Nontraumatic intracerebral hemorrhage, unspecified: Secondary | ICD-10-CM | POA: Diagnosis not present

## 2021-09-09 LAB — CBC
HCT: 26.5 % — ABNORMAL LOW (ref 36.0–46.0)
Hemoglobin: 8.5 g/dL — ABNORMAL LOW (ref 12.0–15.0)
MCH: 29.4 pg (ref 26.0–34.0)
MCHC: 32.1 g/dL (ref 30.0–36.0)
MCV: 91.7 fL (ref 80.0–100.0)
Platelets: 287 10*3/uL (ref 150–400)
RBC: 2.89 MIL/uL — ABNORMAL LOW (ref 3.87–5.11)
RDW: 15.9 % — ABNORMAL HIGH (ref 11.5–15.5)
WBC: 9.5 10*3/uL (ref 4.0–10.5)
nRBC: 0 % (ref 0.0–0.2)

## 2021-09-09 LAB — GLUCOSE, CAPILLARY
Glucose-Capillary: 114 mg/dL — ABNORMAL HIGH (ref 70–99)
Glucose-Capillary: 128 mg/dL — ABNORMAL HIGH (ref 70–99)
Glucose-Capillary: 196 mg/dL — ABNORMAL HIGH (ref 70–99)

## 2021-09-09 LAB — BASIC METABOLIC PANEL
Anion gap: 8 (ref 5–15)
BUN: 54 mg/dL — ABNORMAL HIGH (ref 8–23)
CO2: 34 mmol/L — ABNORMAL HIGH (ref 22–32)
Calcium: 8.7 mg/dL — ABNORMAL LOW (ref 8.9–10.3)
Chloride: 90 mmol/L — ABNORMAL LOW (ref 98–111)
Creatinine, Ser: 0.95 mg/dL (ref 0.44–1.00)
GFR, Estimated: 59 mL/min — ABNORMAL LOW (ref >60)
Glucose, Bld: 104 mg/dL — ABNORMAL HIGH (ref 70–99)
Potassium: 5 mmol/L (ref 3.5–5.1)
Sodium: 132 mmol/L — ABNORMAL LOW (ref 135–145)

## 2021-09-09 MED ORDER — FUROSEMIDE 40 MG PO TABS: 40.0000 mg | ORAL_TABLET | Freq: Every day | ORAL | 0 refills | Status: AC

## 2021-09-09 MED ORDER — EMPAGLIFLOZIN 10 MG PO TABS: 10.0000 mg | ORAL_TABLET | Freq: Every day | ORAL | 0 refills | Status: AC

## 2021-09-09 MED ORDER — METOPROLOL TARTRATE 25 MG/10 ML ORAL SUSPENSION: 25.0000 mg | Freq: Three times a day (TID) | ORAL | 0 refills | Status: AC

## 2021-09-09 MED ORDER — ACETAMINOPHEN 325 MG PO TABS: 650.0000 mg | ORAL_TABLET | Freq: Four times a day (QID) | ORAL | Status: AC | PRN

## 2021-09-09 NOTE — Discharge Summary (Signed)
Physician Discharge Summary   Patient: Gwendolyn [name] MRN: [id number] DOB: [birth year]  Admit date:     08/26/2021  Discharge date: 09/09/21  Discharge Physician: York Ram Dalina Samara   PCP: Irena Reichmann, DO   Recommendations at discharge:    Continue diuresis with furosemide and empagliflozin Follow up renal function and electrolytes in 7 days. Continue to hold on free water flushes until follow up on electrolytes.  Patient poor prognosis due to severe and debilitating CVA.  Continue outpatient follow up with Palliative Care.   Discharge Diagnoses: Principal Problem:   Atrial fibrillation with rapid ventricular response (HCC) Active Problems:   Acute on chronic diastolic CHF (congestive heart failure) (HCC)   Acute metabolic encephalopathy   Hyponatremia   Normocytic anemia   Mixed hyperlipidemia   Hypothyroidism  Resolved Problems:   * No resolved hospital problems. [facility] Course: Gwendolyn [name] was admitted to the hospital with the working diagnosis of atrial fibrillation with RVR, complicated with decompensated heart failure and hyponatremia.   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 Patient has been on feeding tubed per PEG tube for nutritional support. Reported worsening dyspnea for several days, associated with lower extremity edema. Because of persistent symptoms EMS was called and patient was transported to the ED. On her initial physical examination she was in atrial fibrillation with RVR and in respiratory distress. Placed on Bipap and received IV furosemide. Blood pressure 99/67, HR 113, RR 35 02 saturation 99% on supplemental 02, lungs with rales bilaterally, decreased breath sounds and increased work of breathing, heart with S1 and S2 present, irregularly irregular, abdomen not distended and positive lower extremity edema.   Na 123, K 4,4 CL 86,  bicarbonate 22, glucose 98 bun 30 cr 0,90  Wbc 10,2 hgb 8,6 plt 240  Sars covid 19 negative  Urine analysis with SG 1,012 negative leukocytes, negative protein.   Head CT with no acute changes.   Chest radiograph with cardiomegaly with bilateral interstitial infiltrates with bilateral small pleural effusions.   EKG 129 bpm, normal axis, atrial fibrillation rhythm with no significant ST segment or T wave changes.   Patient was placed on furosemide for diuresis.  Added digoxin for rate control, along with amiodarone. Because elevated TSH amiodarone was discontinued and patient was transitioned to metoprolol.  Elevated dig levels, and digoxin was discontinued.   Prolonged hospitalization due to persistent hypervolemia and hyponatremia.  Continue diuresis with improvement in volume status and electrolytes.  Atrial fibrillation was rate controlled with metoprolol with good toleration.   Patient will continue diuresis as outpatient, and will need close follow up on electrolytes.   Assessment and Plan: * Atrial fibrillation with rapid ventricular response (HCC) Patient has been placed on metoprolol with good toleration.  Plan to continue anticoagulation with apixaban.  At the time of her discharge her rhythm continue atrial fibrillation, with rate 80 to 90 range.   Acute on chronic diastolic CHF (congestive heart failure) (HCC) Echocardiogram with preserved LV systolic function with EF 50 to 55%, with mild reduced RV systolic function. RVSP 32.4  Pericardial effusion.   CT chest with bilateral ground glass opacities.  Bilateral pleural effusions and compression atelectasis.   Patient received IV furosemide and one dose of metolazone.  Slowly her volume status, improved, by the time of her discharge, she had negative fluid balance -11,296, with significant improvement in her oxygenation and edema.   Plan to continue  diuresis with SGLT 2 inh and  furosemide 40 mg daily  Holding on RAS  inhibition due to risk of hypotension. Patient with poor prognosis due to neurologic impairment.   Acute metabolic encephalopathy Multifactorial encephalopathy in the setting of CVA with hemorrhagic conversion.   Positive paresis from recent CVA, more left sided than right.  Continue nutrition per tube feedings.  Mentation has improved and seems to be at baseline.  Patient with poor prognosis.   Hyponatremia Hypomagnesemia.   Renal function has remained stable and tolerated diuresis well. At the time of her discharge serum Na is 132, K is 5,0 and serum bicarbonate at 34.  Cr is 0,95 and BUN 54.  Continue to hold on free water flushes for now and follow up renal function in 7 days. If hypernatremia, will recommend to resume free water flushes as tolerated.   Plan to continue furosemide and empagliflozin.  Normocytic anemia SP 1 unit PRBC transfusion Hgb has been stable with no signs of bleeding  Continue anticoagulation with apixaban.   Mixed hyperlipidemia Continue with statin therapy.   Hypothyroidism Continue with levothyroxine   Superficial venous thrombosis of left upper extremity Vascular US positive for age indeterminate superficial vein thrombosis involving the left basilic vein.  No thrombosis in the right subclavian vein.  Continue supportive care and left arm elevation as tolerated.   Skin pressure ulcer stage 3 right ear, stage 3 not clear if present on admission.        Consultants: cardiology, palliative care  Procedures performed: none   Disposition: Skilled nursing facility Diet recommendation:  Tube feedings.  DISCHARGE MEDICATION: Allergies as of 09/09/2021   No Known Allergies      Medication List     STOP taking these medications    amiodarone 200 MG tablet Commonly known as: PACERONE   traMADol 50 MG tablet Commonly known as: ULTRAM       TAKE these medications    acetaminophen 325 MG tablet Commonly known as: TYLENOL Take 2  tablets (650 mg total) by mouth every 6 (six) hours as needed for mild pain (or Fever >/= 101).   apixaban 2.5 MG Tabs tablet Commonly known as: ELIQUIS Take 1 tablet (2.5 mg total) by mouth 2 (two) times daily.   aspirin 81 MG chewable tablet Place 1 tablet (81 mg total) into feeding tube daily.   atorvastatin 40 MG tablet Commonly known as: LIPITOR Place 1 tablet (40 mg total) into feeding tube daily. What changed: when to take this   bisacodyl 10 MG suppository Commonly known as: DULCOLAX Place 10 mg rectally as needed (constipation).   Dialyvite Vitamin D3 Max 1.25 MG (50000 UT) Tabs Generic drug: Cholecalciferol Place 50,000 Units into feeding tube once a week. SUNDAY   empagliflozin 10 MG Tabs tablet Commonly known as: JARDIANCE Take 1 tablet (10 mg total) by mouth daily.   feeding supplement (OSMOLITE 1.5 CAL) Liqd Place 45 mLs into feeding tube continuous.   feeding supplement (PRO-STAT SUGAR FREE 64) Liqd Place 30 mLs into feeding tube daily in the afternoon.   furosemide 40 MG tablet Commonly known as: LASIX Place 1 tablet (40 mg total) into feeding tube daily.   levothyroxine 100 MCG tablet Commonly known as: Synthroid Take 1 tablet (100 mcg total) by mouth daily.   metoprolol tartrate 25 mg/10 mL Susp Commonly known as: LOPRESSOR Place 10 mLs (25 mg total) into feeding tube 3 (three) times daily. What changed:  how much to take when to take  this   MILK OF MAGNESIA PO Take 30 mLs by mouth as needed (If no BM in 3 days).   RA SALINE ENEMA RE Place 1 Dose rectally as needed (constipation not releived by dulcolax suppository).               Discharge Care Instructions  (From admission, onward)           Start     Ordered   09/09/21 0000  Discharge wound care:       Comments: Clean LFA skin tear gently with NS, pat dry then apply a cut to fit piece of Xeroform Gauze, cover with 4x4 and wrap with Kerlix. Change daily. Wound care to right  ear:  cleanse with NS, pat gently dry. Cover lesion with folded piece of xeroform gaze, top with dry gauze and secure with paper tape or silicone foam dressing. Change xeroform daily and PRN dressing dislodgement. Position patient's head off of ear.   09/09/21 0847            Follow-up Information     [facility].   Specialty: General Surgery Why: As needed, If symptoms worsen, to discuss repair of your right inguinal hernia Contact information: [address] [place] [address] [phone]                Discharge Exam: Filed Weights   09/07/21 0432 09/08/21 0500 09/09/21 0433  Weight: 53.9 kg 66.5 kg 53 kg   BP 135/88 (BP Location: Right Arm)   Pulse 72   Temp 98.6 F (37 C) (Oral)   Resp 20   Ht 5\' 1"  (1.549 m)   Wt 53 kg   SpO2 99%   BMI 22.08 kg/m   Patient with no signs of dyspnea or pain, tolerating well tube feedings  Neurology awake and alert ENT with no pallor Cardiovascular with S1 and S2 present, irregularly irregular with no gallops or murmurs Respiratory with no rales or wheezing Abdomen not distended Edema left upper extremity continue to improve.   Condition at discharge: stable  The results of significant diagnostics from this hospitalization (including imaging, microbiology, ancillary and laboratory) are listed below for reference.   Imaging Studies: CT CHEST WO CONTRAST  Result Date: 09/04/2021 CLINICAL DATA:  Pneumonia.  Complication suspected. EXAM: CT CHEST WITHOUT CONTRAST TECHNIQUE: Multidetector CT imaging of the chest was performed following the standard protocol without IV contrast. RADIATION DOSE REDUCTION: This exam was performed according to the departmental dose-optimization program which includes automated exposure control, adjustment of the mA and/or kV according to patient size and/or use of iterative reconstruction technique. COMPARISON:  Plain film of earlier in the morning. CTA chest  of 08/26/2021 FINDINGS: Cardiovascular: Mild motion degradation throughout. Exam also degraded by patient arm position, not raised above the head. Aortic atherosclerosis. Tortuous thoracic aorta. Moderate cardiomegaly, without pericardial effusion. Left main and 3 vessel coronary artery calcification. Aortic valve calcification. Pulmonary artery enlargement, outflow tract 3.2 cm Mediastinum/Nodes: Borderline adenopathy within the azygoesophageal recess at 10 mm is similar and favored to be reactive. Hilar regions poorly evaluated without intravenous contrast. Lungs/Pleura: Moderate bilateral pleural effusions. Similar on the left and slightly increased on the right. Persistent collapse/consolidation within both lower lobes, greater left than right. Worsened upper lobe aeration. Increase in multifocal bilateral upper lobe ground-glass opacities with concurrent septal thickening. Increased lingular airspace disease which is likely atelectasis. Upper Abdomen: Normal imaged portions of the liver, spleen. Musculoskeletal: Anasarca. Mild T12  compression deformity is similar. IMPRESSION: 1. Motion and patient position degraded exam. 2. Worsened aeration since the prior CT, secondary to increased upper lobe ground-glass opacities, favoring pulmonary edema. Atypical infection could look similar. 3. Moderate bilateral pleural effusions with bibasilar airspace disease, likely atelectasis. At the left lung base, lobar pneumonia or aspiration cannot be excluded. 4. Coronary artery atherosclerosis. Aortic Atherosclerosis (ICD10-I70.0). 5. Pulmonary artery enlargement suggests pulmonary arterial hypertension. 6. Aortic valvular calcifications. Consider echocardiography to evaluate for valvular dysfunction. Electronically Signed   By: Jeronimo Greaves M.D.   On: 09/04/2021 08:28   DG CHEST PORT 1 VIEW  Result Date: 09/04/2021 CLINICAL DATA:  Shortness of breath EXAM: PORTABLE CHEST 1 VIEW COMPARISON:  Previous studies including the  examination of 08/26/2021 FINDINGS: Transverse diameter of the heart is increased. There is interval decrease in pulmonary vascular congestion. There is increased opacity in left mid and left lower lung fields suggesting increase in amount of left pleural effusion. Evaluation for atelectasis/pneumonia in the left mid and left lower lung fields is limited by the effusion. Small right pleural effusion is noted. There is no pneumothorax. Dense calcification is seen in mitral annulus. IMPRESSION: There is interval decrease in pulmonary vascular congestion. There is interval increase in left pleural effusion obscuring left mid and left lower lung fields. Small right pleural effusion. Electronically Signed   By: Ernie Avena M.D.   On: 09/04/2021 08:24   CT PELVIS W CONTRAST  Result Date: 08/29/2021 CLINICAL DATA:  Soft tissue mass in the groin. EXAM: CT PELVIS WITH CONTRAST TECHNIQUE: Multidetector CT imaging of the pelvis was performed using the standard protocol following the bolus administration of intravenous contrast. RADIATION DOSE REDUCTION: This exam was performed according to the departmental dose-optimization program which includes automated exposure control, adjustment of the mA and/or kV according to patient size and/or use of iterative reconstruction technique. CONTRAST:  OMNIPAQUE IOHEXOL 300 MG/ML  SOLN COMPARISON:  None Available. FINDINGS: Urinary Tract:  No abnormality visualized. Bowel: Bowel loops of the pelvis are nondilated. Advanced diverticular disease noted sigmoid colon. Prominent rectal stool volume. Small bowel loops are contained in a large right groin hernia. No evidence for small bowel obstruction. No small bowel wall thickening. No substantial edema or fluid within the hernia sac. Vascular/Lymphatic: No pathologically enlarged lymph nodes. No significant vascular abnormality seen. Reproductive:  Unremarkable. Other:  No substantial intraperitoneal free fluid.  Musculoskeletal: No worrisome lytic or sclerotic osseous abnormality. Diffuse body wall edema evident. IMPRESSION: 1. Large right groin hernia containing small bowel loops. No complicating features. 2. Advanced diverticular disease sigmoid colon without evidence for acute diverticulitis. 3. Prominent rectal stool volume. 4. Diffuse body wall edema. Electronically Signed   By: Kennith Center M.D.   On: 08/29/2021 08:20   EEG adult  Result Date: 08/27/2021 Charlsie Quest, MD     08/27/2021  3:03 PM Patient Name: Caeli Linehan MRN: [id number] Epilepsy Attending: Charlsie Quest Referring Physician/Provider: Marinda Elk, MD Date: 08/27/2021 Duration: 22.02 mins Patient history: 85yo f with ams. EEG to evaluate for seizure Level of alertness: Awake AEDs during EEG study: None Technical aspects: This EEG study was done with scalp electrodes positioned according to the 10-20 International system of electrode placement. Electrical activity was acquired at a sampling rate of 500Hz  and reviewed with a high frequency filter of 70Hz  and a low frequency filter of 1Hz . EEG data were recorded continuously and digitally stored. Description: No clear posterior dominant rhythm was seen. EEG showed continuous generalized  3 to 6 Hz theta-delta slowing.  Hyperventilation and photic stimulation were not performed.   ABNORMALITY -Continuous slow, generalized IMPRESSION: This study is suggestive of moderate diffuse encephalopathy, nonspecific etiology. No seizures or epileptiform discharges were seen throughout the recording. Priyanka O Yadav   VAS [place] UPPER EXTREMITY VENOUS DUPLEX  Result Date: 08/27/2021 UPPER VENOUS STUDY  Patient Name:  JERILYNN Gabriel  Date of Exam:   08/27/2021 Medical Rec #: [id number]         Accession #:    [id number] Date of Birth: Jun 21, 1935        Patient Gender: F Patient Age:   68 years Exam Location:  [facility] Procedure:      VAS [place] UPPER EXTREMITY VENOUS DUPLEX Referring Phys:  Shauna Hugh --------------------------------------------------------------------------------  Indications: "DVT" Comparison Study: No previous exam noted. Performing Technologist: Magdalene River BS, RVT  Examination Guidelines: A complete evaluation includes B-mode imaging, spectral Doppler, color Doppler, and power Doppler as needed of all accessible portions of each vessel. Bilateral testing is considered an integral part of a complete examination. Limited examinations for reoccurring indications may be performed as noted.  Right Findings: +----------+------------+---------+-----------+----------+-------+ RIGHT     CompressiblePhasicitySpontaneousPropertiesSummary +----------+------------+---------+-----------+----------+-------+ Subclavian    Full       Yes       Yes                      +----------+------------+---------+-----------+----------+-------+  Left Findings: +----------+------------+---------+-----------+----------+-------+ LEFT      CompressiblePhasicitySpontaneousPropertiesSummary +----------+------------+---------+-----------+----------+-------+ IJV           Full       Yes       Yes                      +----------+------------+---------+-----------+----------+-------+ Subclavian    Full       Yes       Yes                      +----------+------------+---------+-----------+----------+-------+ Axillary      Full       Yes       Yes                      +----------+------------+---------+-----------+----------+-------+ Brachial      Full                                          +----------+------------+---------+-----------+----------+-------+ Radial        Full                                          +----------+------------+---------+-----------+----------+-------+ Ulnar         Full                                          +----------+------------+---------+-----------+----------+-------+ Cephalic      Full                                           +----------+------------+---------+-----------+----------+-------+ Basilic     Partial  Yes       Yes                      +----------+------------+---------+-----------+----------+-------+ The left basilic vein at the mid upper arm is partially compressible with no obstruction of flow throughout.  Summary:  Right: No evidence of thrombosis in the subclavian.  Left: No evidence of deep vein thrombosis in the upper extremity. Findings consistent with age indeterminate superficial vein thrombosis involving the left basilic vein. Wall thickening noted in the basilic at the mid and distal upper arm.  *See table(s) above for measurements and observations.  Diagnosing physician: Sherald Hess MD Electronically signed by Sherald Hess MD on 08/27/2021 at 1:01:53 PM.    Final    ECHOCARDIOGRAM COMPLETE  Result Date: 08/27/2021    ECHOCARDIOGRAM REPORT   Patient Name:   [facility] Date of Exam: 08/27/2021 Medical Rec #:  [id number]        Height:       61.0 in Accession #:    [id number]       Weight:       135.0 lb Date of Birth:  12-Sep-1935       BSA:          1.598 m Patient Age:    85 years         BP:           131/83 mmHg Patient Gender: F                HR:           113 bpm. Exam Location:  Inpatient Procedure: 2D Echo, Cardiac Doppler and Color Doppler Indications:    CHF  History:        Patient has prior history of Echocardiogram examinations.                 Arrythmias:Atrial Fibrillation; Risk Factors:Hypertension.  Sonographer:    Cleatis Polka Referring Phys: [id number] Deno Lunger SHAL[place] IMPRESSIONS  1. Left ventricular ejection fraction, by estimation, is 50 to 55%. The left ventricle has low normal function. The left ventricle has no regional wall motion abnormalities. Diastolic function indeterminant due to Afib.  2. Right ventricular systolic function is mildly reduced. The right ventricular size is normal. There is normal pulmonary artery systolic pressure.  The estimated right ventricular systolic pressure is 32.4 mmHg.  3. Left atrial size was mild to moderately dilated.  4. Right atrial size was mildly dilated.  5. There is a moderate sized, circumferential perficardial effusion present that appears largest around the RA/RV base. The pericardial effusion is circumferential. There is no evidence of cardiac tamponade. There is prominent epicardial fat present.  6. The mitral valve is degenerative. Trivial mitral valve regurgitation. Severe mitral annular calcification.  7. The aortic valve is tricuspid. There is moderate calcification of the aortic valve. There is moderate thickening of the aortic valve. Aortic valve regurgitation is not visualized. Aortic valve sclerosis/calcification is present, without any evidence of aortic stenosis.  8. The inferior vena cava is dilated in size with >50% respiratory variability, suggesting right atrial pressure of 8 mmHg. Comparison(s): Compared to prior TTE, the pericardial effusion is now moderate (previously trivial). Otherwise, there is no significant change. FINDINGS  Left Ventricle: Left ventricular ejection fraction, by estimation, is 50 to 55%. The left ventricle has low normal function. The left ventricle has no regional wall motion abnormalities. The left ventricular internal cavity size was normal in size. There is  no left ventricular hypertrophy. Diastolic function indeterminant due to Afib. Right Ventricle: The right ventricular size is normal. No increase in right ventricular wall thickness. Right ventricular systolic function is mildly reduced. There is normal pulmonary artery systolic pressure. The tricuspid regurgitant velocity is 2.47 m/s, and with an assumed right atrial pressure of 8 mmHg, the estimated right ventricular systolic pressure is 32.4 mmHg. Left Atrium: Left atrial size was mild to moderately dilated. Right Atrium: Right atrial size was mildly dilated. Pericardium: There is very prominent epicardial  fat present. There is a moderate sized, circumferential perficardial effusion present that appears largest around the RA/RV base. The pericardial effusion is circumferential. There is no evidence of cardiac  tamponade. Mitral Valve: The mitral valve is degenerative in appearance. There is mild thickening of the mitral valve leaflet(s). There is mild calcification of the mitral valve leaflet(s). Severe mitral annular calcification. Trivial mitral valve regurgitation. Tricuspid Valve: The tricuspid valve is normal in structure. Tricuspid valve regurgitation is mild. Aortic Valve: The aortic valve is tricuspid. There is moderate calcification of the aortic valve. There is moderate thickening of the aortic valve. Aortic valve regurgitation is not visualized. Aortic valve sclerosis/calcification is present, without any  evidence of aortic stenosis. Aortic valve peak gradient measures 5.8 mmHg. Pulmonic Valve: The pulmonic valve was normal in structure. Pulmonic valve regurgitation is trivial. Aorta: The aortic root and ascending aorta are structurally normal, with no evidence of dilitation. Venous: The inferior vena cava is dilated in size with greater than 50% respiratory variability, suggesting right atrial pressure of 8 mmHg. IAS/Shunts: The atrial septum is grossly normal. Additional Comments: There is a small pleural effusion in the left lateral region.  LEFT VENTRICLE PLAX 2D LVIDd:         3.30 cm     Diastology LVIDs:         2.50 cm     LV e' medial:    7.07 cm/s LV PW:         1.20 cm     LV E/e' medial:  14.9 LV IVS:        1.00 cm     LV e' lateral:   7.72 cm/s LVOT diam:     1.80 cm     LV E/e' lateral: 13.6 LV SV:         32 LV SV Index:   20 LVOT Area:     2.54 cm  LV Volumes (MOD) LV vol d, MOD A2C: 47.3 ml LV vol d, MOD A4C: 40.7 ml LV vol s, MOD A2C: 20.8 ml LV vol s, MOD A4C: 17.7 ml LV SV MOD A2C:     26.5 ml LV SV MOD A4C:     40.7 ml LV SV MOD BP:      24.9 ml RIGHT VENTRICLE            IVC RV  Basal diam:  2.80 cm    IVC diam: 2.00 cm RV Mid diam:    1.90 cm RV S prime:     9.25 cm/s TAPSE (M-mode): 1.2 cm LEFT ATRIUM             Index        RIGHT ATRIUM           Index LA diam:        3.70 cm 2.32 cm/m   RA Area:     15.50 cm LA Vol (A2C):   48.6 ml 30.41 ml/m  RA Volume:  36.20 ml  22.65 ml/m LA Vol (A4C):   49.8 ml 31.16 ml/m LA Biplane Vol: 50.6 ml 31.66 ml/m  AORTIC VALVE AV Area (Vmax): 1.77 cm AV Vmax:        120.00 cm/s AV Peak Grad:   5.8 mmHg LVOT Vmax:      83.60 cm/s LVOT Vmean:     59.200 cm/s LVOT VTI:       0.125 m  AORTA Ao Root diam: 3.00 cm Ao Asc diam:  2.80 cm MITRAL VALVE                TRICUSPID VALVE MV Area (PHT): 5.50 cm     TR Peak grad:   24.4 mmHg MV Decel Time: 138 msec     TR Vmax:        247.00 cm/s MR Peak grad: 85.0 mmHg MR Vmax:      461.00 cm/s   SHUNTS MV E velocity: 105.00 cm/s  Systemic VTI:  0.12 m                             Systemic Diam: 1.80 cm Laurance Flatten MD Electronically signed by Laurance Flatten MD Signature Date/Time: 08/27/2021/12:08:36 PM    Final    CT HEAD WO CONTRAST ( )  Result Date: 08/27/2021 CLINICAL DATA:  Mental status change with unknown cause EXAM: CT HEAD WITHOUT CONTRAST TECHNIQUE: Contiguous axial images were obtained from the base of the skull through the vertex without intravenous contrast. RADIATION DOSE REDUCTION: This exam was performed according to the departmental dose-optimization program which includes automated exposure control, adjustment of the mA and/or kV according to patient size and/or use of iterative reconstruction technique. 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 High-density along the surface of the cortex is attributed to most likely mineralization, also seen in the basal ganglia. No new area of infarct. No hematoma is suspected. Cerebral volume loss. No hydrocephalus Vascular: No hyperdense vessel or unexpected  calcification. Skull: Normal. Negative for fracture or focal lesion. Sinuses/Orbits: No acute finding. Other: Intravenous gas asymmetric to the right paravertebral plexus and temporal fossa, usually from IV access. IMPRESSION: 1. No acute finding. 2. Continued evolution of the remote right MCA territory infarct. Electronically Signed   By: Tiburcio Pea M.D.   On: 08/27/2021 05:06   CT Angio Chest PE W and/or Wo Contrast  Result Date: 08/26/2021 CLINICAL DATA:  Concern for pulmonary embolism. EXAM: CT ANGIOGRAPHY CHEST WITH CONTRAST TECHNIQUE: Multidetector CT imaging of the chest was performed using the standard protocol during bolus administration of intravenous contrast. Multiplanar CT image reconstructions and MIPs were obtained to evaluate the vascular anatomy. RADIATION DOSE REDUCTION: This exam was performed according to the departmental dose-optimization program which includes automated exposure control, adjustment of the mA and/or kV according to patient size and/or use of iterative reconstruction technique. CONTRAST:  60mL OMNIPAQUE IOHEXOL 350 MG/ML SOLN COMPARISON:  Chest radiograph dated 08/26/2021. FINDINGS: Cardiovascular: Borderline cardiomegaly. Small pericardial effusion measuring 7 mm in thickness. There is calcification of the mitral annulus. Coronary vascular calcifications. And mild atherosclerotic calcification of the thoracic aorta. No aneurysmal dilatation. No pulmonary artery embolus identified. Mediastinum/Nodes: Right hilar adenopathy measures 9 mm short axis. The esophagus is grossly unremarkable. No mediastinal fluid collection. Lungs/Pleura: Moderate bilateral pleural effusions with partial compressive atelectasis of the lower lobes versus pneumonia. There is diffuse interstitial and interlobular septal prominence consistent with edema. Scattered  clusters of ground-glass density primarily throughout the right upper lobe, likely edema. Pneumonia is not excluded. There is no  pneumothorax. The central airways are patent. Upper Abdomen: No acute abnormality. Musculoskeletal: Degenerative changes of the spine. No acute osseous pathology. Review of the MIP images confirms the above findings. IMPRESSION: 1. No CT evidence of pulmonary artery embolus. 2. Moderate bilateral pleural effusions with compressive atelectasis of the lower lobes versus pneumonia. 3. Scattered clusters of ground-glass density primarily throughout the right upper lobe, likely edema. Pneumonia is not excluded. 4. Borderline cardiomegaly with small pericardial effusion. 5. Aortic Atherosclerosis (ICD10-I70.0). Electronically Signed   By: Elgie Collard M.D.   On: 08/26/2021 21:11   DG Chest Portable 1 View  Result Date: 08/26/2021 CLINICAL DATA:  Dyspnea EXAM: PORTABLE CHEST 1 VIEW COMPARISON:  Radiographs 07/05/2021 FINDINGS: Enlarged cardiomediastinal silhouette. New hazy airspace and interstitial opacities greatest in the lung bases. New layering small-moderate left small right pleural effusions. Aortic calcification. Mitral annular calcification. IMPRESSION: 1. Findings favored to represent pulmonary edema though pneumonia is difficult to exclude. 2. Small-moderate left and probable small right pleural effusions. 3. Stable cardiomegaly. Electronically Signed   By: Minerva Fester M.D.   On: 08/26/2021 13:52    Microbiology: Results for orders placed or performed during the hospital encounter of 08/26/21  Blood culture (routine x 2)     Status: None   Collection Time: 08/26/21  2:28 PM   Specimen: BLOOD RIGHT WRIST  Result Value Ref Range Status   Specimen Description BLOOD RIGHT WRIST  Final   Special Requests   Final    BOTTLES DRAWN AEROBIC AND ANAEROBIC Blood Culture adequate volume   Culture   Final    NO GROWTH 5 DAYS Performed at [facility], [address], [place], [place] [address]    Report Status 08/31/2021 FINAL  Final  Blood culture (routine x 2)     Status: Abnormal    Collection Time: 08/26/21  2:42 PM   Specimen: BLOOD LEFT ARM  Result Value Ref Range Status   Specimen Description BLOOD LEFT ARM  Final   Special Requests   Final    BOTTLES DRAWN AEROBIC AND ANAEROBIC Blood Culture adequate volume   Culture  Setup Time   Final    GRAM POSITIVE COCCI IN CLUSTERS ANAEROBIC BOTTLE ONLY CRITICAL RESULT CALLED TO, READ BACK BY AND VERIFIED WITH: PHARMD CAREN AMEND ON 08/27/21 @ 1906 BY DRT    Culture (A)  Final    STAPHYLOCOCCUS CAPITIS THE SIGNIFICANCE OF ISOLATING THIS ORGANISM FROM A SINGLE SET OF BLOOD CULTURES WHEN MULTIPLE SETS ARE DRAWN IS UNCERTAIN. PLEASE NOTIFY THE MICROBIOLOGY DEPARTMENT WITHIN ONE WEEK IF SPECIATION AND SENSITIVITIES ARE REQUIRED. Performed at [facility], [address] [place], [place] [address]    Report Status 08/29/2021 FINAL  Final  Blood Culture ID Panel (Reflexed)     Status: Abnormal   Collection Time: 08/26/21  2:42 PM  Result Value Ref Range Status   Enterococcus faecalis NOT DETECTED NOT DETECTED Final   Enterococcus Faecium NOT DETECTED NOT DETECTED Final   Listeria monocytogenes NOT DETECTED NOT DETECTED Final   Staphylococcus species DETECTED (A) NOT DETECTED Final    Comment: CRITICAL RESULT CALLED TO, READ BACK BY AND VERIFIED WITH: PHARMD CAREN AMEND ON 08/27/21 @ 1906 BY DRT    Staphylococcus aureus (BCID) NOT DETECTED NOT DETECTED Final   Staphylococcus epidermidis NOT DETECTED NOT DETECTED Final   Staphylococcus lugdunensis NOT DETECTED NOT DETECTED  Final   Streptococcus species NOT DETECTED NOT DETECTED Final   Streptococcus agalactiae NOT DETECTED NOT DETECTED Final   Streptococcus pneumoniae NOT DETECTED NOT DETECTED Final   Streptococcus pyogenes NOT DETECTED NOT DETECTED Final   A.calcoaceticus-baumannii NOT DETECTED NOT DETECTED Final   Bacteroides fragilis NOT DETECTED NOT DETECTED Final   Enterobacterales NOT DETECTED NOT DETECTED Final   Enterobacter cloacae complex NOT DETECTED NOT  DETECTED Final   Escherichia coli NOT DETECTED NOT DETECTED Final   Klebsiella aerogenes NOT DETECTED NOT DETECTED Final   Klebsiella oxytoca NOT DETECTED NOT DETECTED Final   Klebsiella pneumoniae NOT DETECTED NOT DETECTED Final   Proteus species NOT DETECTED NOT DETECTED Final   Salmonella species NOT DETECTED NOT DETECTED Final   Serratia marcescens NOT DETECTED NOT DETECTED Final   Haemophilus influenzae NOT DETECTED NOT DETECTED Final   Neisseria meningitidis NOT DETECTED NOT DETECTED Final   Pseudomonas aeruginosa NOT DETECTED NOT DETECTED Final   Stenotrophomonas maltophilia NOT DETECTED NOT DETECTED Final   Candida albicans NOT DETECTED NOT DETECTED Final   Candida auris NOT DETECTED NOT DETECTED Final   Candida glabrata NOT DETECTED NOT DETECTED Final   Candida krusei NOT DETECTED NOT DETECTED Final   Candida parapsilosis NOT DETECTED NOT DETECTED Final   Candida tropicalis NOT DETECTED NOT DETECTED Final   Cryptococcus neoformans/gattii NOT DETECTED NOT DETECTED Final    Comment: Performed at [facility], [address] [place], [place] [address]  Resp Panel by RT-PCR (Flu A&B, Covid) Anterior Nasal Swab     Status: None   Collection Time: 08/26/21 11:18 PM   Specimen: Anterior Nasal Swab  Result Value Ref Range Status   SARS Coronavirus 2 by RT PCR NEGATIVE NEGATIVE Final    Comment: (NOTE) SARS-CoV-2 target nucleic acids are NOT DETECTED.  The SARS-CoV-2 RNA is generally detectable in upper respiratory specimens during the acute phase of infection. The lowest concentration of SARS-CoV-2 viral copies this assay can detect is 138 copies/mL. A negative result does not preclude SARS-Cov-2 infection and should not be used as the sole basis for treatment or other patient management decisions. A negative result may occur with  improper specimen collection/handling, submission of specimen other than nasopharyngeal swab, presence of viral mutation(s) within the areas  targeted by this assay, and inadequate number of viral copies(<138 copies/mL). A negative result must be combined with clinical observations, patient history, and epidemiological information. The expected result is Negative.  Fact Sheet for Patients:  BloggerCourse.com  Fact Sheet for Healthcare Providers:  SeriousBroker.it  This test is no t yet approved or cleared by the [place] FDA and  has been authorized for detection and/or diagnosis of SARS-CoV-2 by FDA under an Emergency Use Authorization (EUA). This EUA will remain  in effect (meaning this test can be used) for the duration of the COVID-19 declaration under Section 564(b)(1) of the Act, 21 U.S.C.section 360bbb-3(b)(1), unless the authorization is terminated  or revoked sooner.       Influenza A by PCR NEGATIVE NEGATIVE Final   Influenza B by PCR NEGATIVE NEGATIVE Final    Comment: (NOTE) The Xpert Xpress SARS-CoV-2/FLU/RSV plus assay is intended as an aid in the diagnosis of influenza from Nasopharyngeal swab specimens and should not be used as a sole basis for treatment. Nasal washings and aspirates are unacceptable for Xpert Xpress SARS-CoV-2/FLU/RSV testing.  Fact Sheet for Patients: BloggerCourse.com  Fact Sheet for Healthcare Providers: SeriousBroker.it  This test is not yet approved or cleared by  the [employer] and has been authorized for detection and/or diagnosis of SARS-CoV-2 by FDA under an Emergency Use Authorization (EUA). This EUA will remain in effect (meaning this test can be used) for the duration of the COVID-19 declaration under Section 564(b)(1) of the Act, 21 U.S.C. section 360bbb-3(b)(1), unless the authorization is terminated or revoked.  Performed at [facility], [address], [place], [place] [address]     Labs: CBC: Recent Labs  Lab 09/09/21 0428  WBC 9.5  HGB  8.5*  HCT 26.5*  MCV 91.7  PLT 287   Basic Metabolic Panel: Recent Labs  Lab 09/05/21 0155 09/06/21 0134 09/07/21 0308 09/08/21 0952 09/09/21 0428  NA 123* 124* 126* 130* 132*  K 4.5 4.1 4.5 4.7 5.0  CL 85* 86* 85* 88* 90*  CO2 28 30 34* 32 34*  GLUCOSE 146* 122* 105* 166* 104*  BUN 35* 39* 47* 52* 54*  CREATININE 0.76 0.80 0.85 1.06* 0.95  CALCIUM 8.2* 8.0* 8.1* 8.6* 8.7*  MG 1.9  --   --   --   --    Liver Function Tests: No results for input(s): "AST", "ALT", "ALKPHOS", "BILITOT", "PROT", "ALBUMIN" in the last 168 hours. CBG: Recent Labs  Lab 09/08/21 1640 09/08/21 2150 09/09/21 0004 09/09/21 0431 09/09/21 0752  GLUCAP 96 171* 196* 114* 128*    Discharge time spent: greater than 30 minutes.  Signed: Coralie Keens, MD [facility] 09/09/2021

## 2021-09-09 NOTE — TOC Transition Note (Signed)
Transition of Care [facility]) - CM/SW Discharge Note   Patient Details  Name: Gwendolyn [name] MRN: [id number] Date of Birth: Jan 29, 1936  [facility] CM/SW Contact:  Delilah Shan, LCSWA Phone Number: 09/09/2021, 10:40 AM   Clinical Narrative:     Patient will DC to: [facility]  Anticipated DC date: 09/09/2021  Family notified: [name]   Transport by: Sharin Mons  ?  Per MD patient ready for DC to [facility] with in house palliative services at facility . RN, patient, patient's family, and facility notified of DC. Discharge Summary sent to facility. RN given number for report tele# [phone] [id number]. DC packet on chart. DNR signed by MD attached to patients DC packet. Ambulance transport requested for patient.  CSW signing off.   Final next level of care: Skilled Nursing Facility Barriers to Discharge: No Barriers Identified   Patient Goals and CMS Choice Patient states their goals for this hospitalization and ongoing recovery are:: SNF CMS Medicare.gov Compare Post Acute Care list provided to:: Patient Represenative (must comment) (Patients Niece [name]) Choice offered to / list presented to :  (Patients Niece [name])  Discharge Placement              Patient chooses bed at: [facility] Patient to be transferred to facility by: [facility] Name of family member notified: [name] Patient and family notified of of transfer: 09/09/21  Discharge Plan and Services In-house Referral: Clinical Social Work                                   Social Determinants of Health (SDOH) Interventions     Readmission Risk Interventions     No data to display

## 2021-09-09 NOTE — [place] FL2 (Signed)
[place]  MEDICAID FL2 LEVEL OF CARE SCREENING TOOL     IDENTIFICATION  [name] Birthdate: 13-Aug-1935 Sex: female Admission Date (Current Location): 08/26/2021  [facility] and [place] Number:  Producer, television/film/video and Address:  [facility] [employer], [address], [place], [place] [address]      Provider Number: [id number]  Attending Physician Name and Address:  Coralie Keens  Relative Name and Phone Number:  [name] (niece)    Current Level of Care: Hospital Recommended Level of Care: Skilled Nursing Facility Prior Approval Number:    Date Approved/Denied:   PASRR Number: [id number]  Discharge Plan: SNF    Current Diagnoses: Patient Active Problem List   Diagnosis Date Noted   Superficial venous thrombosis of left upper extremity 09/09/2021   Hypothyroidism 09/03/2021   Normocytic anemia 08/27/2021   Acute on chronic diastolic CHF (congestive heart failure) (HCC) 08/27/2021   Acute metabolic encephalopathy 08/27/2021   Atrial fibrillation with rapid ventricular response (HCC) 08/26/2021   Hyponatremia 08/26/2021   Essential hypertension 08/26/2021   Mixed hyperlipidemia 08/26/2021   Persistent atrial fibrillation (HCC) 07/07/2021   Pressure injury of skin 07/07/2021   Intracerebral hemorrhage (HCC) 07/03/2021    Orientation RESPIRATION BLADDER Height & Weight     Self  O2 (2 liters of Nasal Cannula) Incontinent, External catheter (External Urinary Catheter) Weight: 116 lb 13.5 oz (53 kg) Height:  5\' 1"  (154.9 cm)  BEHAVIORAL SYMPTOMS/MOOD NEUROLOGICAL BOWEL NUTRITION STATUS      Continent (WDL) Diet (Please see discharge summary)  AMBULATORY STATUS COMMUNICATION OF NEEDS Skin   Total Care Verbally Other (Comment) (Approp. for ethnicity,dry,blister,serous,breast,L,cleansed,abrasion,arm,ear,bil.gauze,ecchymosis,hip,knee,arm,R,non-tenting,Wound incis.,LDAs,please see additional info)                        Personal Care Assistance Level of Assistance  Bathing, Feeding, Dressing Bathing Assistance: Maximum assistance Feeding assistance:  (Peg Tube) Dressing Assistance: Maximum assistance     Functional Limitations Info  Sight, Hearing, Speech Sight Info: Adequate (WDL) Hearing Info: Adequate (WDL) Speech Info: Adequate (WDL)    SPECIAL CARE FACTORS FREQUENCY                       Contractures Contractures Info: Not present    Additional Factors Info  Code Status, Allergies Code Status Info: FULL Allergies Info: No Known Allergies           Current Medications (09/09/2021):  This is the current hospital active medication list Current Facility-Administered Medications  Medication Dose Route Frequency Provider Last Rate Last Admin   acetaminophen (TYLENOL) tablet 650 mg  650 mg Oral Q6H PRN 11/09/2021, MD   650 mg at 09/07/21 2152   Or   acetaminophen (TYLENOL) suppository 650 mg  650 mg Rectal Q6H PRN Shalhoub, 2153, MD       apixaban Deno Lunger) tablet 2.5 mg  2.5 mg Per Tube BID Pham, Minh Q, RPH-CPP   2.5 mg at 09/09/21 0855   atorvastatin (LIPITOR) tablet 40 mg  40 mg Per Tube Daily Shalhoub, 11/09/21, MD   40 mg at 09/09/21 0855   empagliflozin (JARDIANCE) tablet 10 mg  10 mg Oral Daily Arrien, 11/09/21, MD   10 mg at 09/09/21 0855   feeding supplement (OSMOLITE 1.5 CAL) liquid 300 mL  300 mL Per Tube TID PC & HS 11/09/21, MD   300 mL at 09/09/21 0855   furosemide (LASIX) tablet 40  mg  40 mg Per Tube Daily Arrien, York Ram, MD   40 mg at 09/09/21 0855   ipratropium-albuterol (DUONEB) 0.5-2.5 (3) MG/3ML nebulizer solution 3 mL  3 mL Nebulization Q4H PRN Marinda Elk, MD   3 mL at 09/04/21 2353   levothyroxine (SYNTHROID) tablet 100 mcg  100 mcg Per Tube [id number] Meredeth Ide, MD   100 mcg at 09/09/21 0630   metoprolol tartrate (LOPRESSOR) 25 mg/10 mL oral suspension 25 mg  25 mg Per Tube TID Sande Rives, MD   25 mg at  09/09/21 0631   ondansetron (ZOFRAN) tablet 4 mg  4 mg Per Tube Q6H PRN Pham, Minh Q, RPH-CPP       Or   ondansetron (ZOFRAN) injection 4 mg  4 mg Intravenous Q6H PRN Pham, Minh Q, RPH-CPP       Oral care mouth rinse  15 mL Mouth Rinse PRN Arrien, York Ram, MD       oxyCODONE (Oxy IR/ROXICODONE) immediate release tablet 5 mg  5 mg Per Tube Q6H PRN Arlester Marker, Minh Q, RPH-CPP   5 mg at 09/07/21 2153     Discharge Medications: Please see discharge summary for a list of discharge medications.  Relevant Imaging Results:  Relevant Lab Results:   Additional Information SSN-[id number],PI sacrum medial deep tissue,foam lift dressing,clean,dry,intact,every 3 days,early partial granulation  Delilah Shan, LCSWA

## 2021-09-09 NOTE — Assessment & Plan Note (Signed)
Vascular US positive for age indeterminate superficial vein thrombosis involving the left basilic vein.  No thrombosis in the right subclavian vein.  Continue supportive care and left arm elevation as tolerated.

## 2021-09-11 DIAGNOSIS — I4819 Other persistent atrial fibrillation: Secondary | ICD-10-CM | POA: Diagnosis not present

## 2021-09-11 DIAGNOSIS — G9349 Other encephalopathy: Secondary | ICD-10-CM | POA: Diagnosis not present

## 2021-09-11 DIAGNOSIS — I619 Nontraumatic intracerebral hemorrhage, unspecified: Secondary | ICD-10-CM | POA: Diagnosis not present

## 2021-09-11 DIAGNOSIS — I6340 Cerebral infarction due to embolism of unspecified cerebral artery: Secondary | ICD-10-CM | POA: Diagnosis not present

## 2021-09-12 DIAGNOSIS — L89153 Pressure ulcer of sacral region, stage 3: Secondary | ICD-10-CM | POA: Diagnosis not present

## 2021-09-12 DIAGNOSIS — E039 Hypothyroidism, unspecified: Secondary | ICD-10-CM | POA: Diagnosis not present

## 2021-09-12 DIAGNOSIS — E785 Hyperlipidemia, unspecified: Secondary | ICD-10-CM | POA: Diagnosis not present

## 2021-09-12 DIAGNOSIS — I110 Hypertensive heart disease with heart failure: Secondary | ICD-10-CM | POA: Diagnosis not present

## 2021-09-12 DIAGNOSIS — I69354 Hemiplegia and hemiparesis following cerebral infarction affecting left non-dominant side: Secondary | ICD-10-CM | POA: Diagnosis not present

## 2021-09-12 DIAGNOSIS — J9690 Respiratory failure, unspecified, unspecified whether with hypoxia or hypercapnia: Secondary | ICD-10-CM | POA: Diagnosis not present

## 2021-09-12 DIAGNOSIS — I509 Heart failure, unspecified: Secondary | ICD-10-CM | POA: Diagnosis not present

## 2021-09-12 DIAGNOSIS — I4891 Unspecified atrial fibrillation: Secondary | ICD-10-CM | POA: Diagnosis not present

## 2021-09-12 DIAGNOSIS — I69318 Other symptoms and signs involving cognitive functions following cerebral infarction: Secondary | ICD-10-CM | POA: Diagnosis not present

## 2021-09-12 DIAGNOSIS — I82612 Acute embolism and thrombosis of superficial veins of left upper extremity: Secondary | ICD-10-CM | POA: Diagnosis not present

## 2021-09-12 DIAGNOSIS — L89892 Pressure ulcer of other site, stage 2: Secondary | ICD-10-CM | POA: Diagnosis not present

## 2021-09-13 DIAGNOSIS — I509 Heart failure, unspecified: Secondary | ICD-10-CM | POA: Diagnosis not present

## 2021-09-13 DIAGNOSIS — I69354 Hemiplegia and hemiparesis following cerebral infarction affecting left non-dominant side: Secondary | ICD-10-CM | POA: Diagnosis not present

## 2021-09-13 DIAGNOSIS — L89153 Pressure ulcer of sacral region, stage 3: Secondary | ICD-10-CM | POA: Diagnosis not present

## 2021-09-13 DIAGNOSIS — I69318 Other symptoms and signs involving cognitive functions following cerebral infarction: Secondary | ICD-10-CM | POA: Diagnosis not present

## 2021-09-13 DIAGNOSIS — L89892 Pressure ulcer of other site, stage 2: Secondary | ICD-10-CM | POA: Diagnosis not present

## 2021-09-13 DIAGNOSIS — I110 Hypertensive heart disease with heart failure: Secondary | ICD-10-CM | POA: Diagnosis not present

## 2021-09-15 ENCOUNTER — Other Ambulatory Visit: Payer: Self-pay | Admitting: *Deleted

## 2021-09-15 DIAGNOSIS — L89892 Pressure ulcer of other site, stage 2: Secondary | ICD-10-CM | POA: Diagnosis not present

## 2021-09-15 DIAGNOSIS — I110 Hypertensive heart disease with heart failure: Secondary | ICD-10-CM | POA: Diagnosis not present

## 2021-09-15 DIAGNOSIS — L89894 Pressure ulcer of other site, stage 4: Secondary | ICD-10-CM | POA: Diagnosis not present

## 2021-09-15 DIAGNOSIS — I69354 Hemiplegia and hemiparesis following cerebral infarction affecting left non-dominant side: Secondary | ICD-10-CM | POA: Diagnosis not present

## 2021-09-15 DIAGNOSIS — L89153 Pressure ulcer of sacral region, stage 3: Secondary | ICD-10-CM | POA: Diagnosis not present

## 2021-09-15 DIAGNOSIS — I69318 Other symptoms and signs involving cognitive functions following cerebral infarction: Secondary | ICD-10-CM | POA: Diagnosis not present

## 2021-09-15 DIAGNOSIS — I509 Heart failure, unspecified: Secondary | ICD-10-CM | POA: Diagnosis not present

## 2021-09-15 NOTE — Patient Outreach (Signed)
[facility] Post- Acute Care Coordinator follow up.   Mrs . Nudo resides in [place] [facility]. Verified with SNF SW that Mrs. Gorter is under LTC on hospice services.   No identifiable [facility] needs.    Raiford Noble, MSN, RN,BSN [facility] Post Acute Care Coordinator [phone] [facility]) [phone]  (Toll free office)

## 2021-09-16 DIAGNOSIS — I509 Heart failure, unspecified: Secondary | ICD-10-CM | POA: Diagnosis not present

## 2021-09-16 DIAGNOSIS — I69354 Hemiplegia and hemiparesis following cerebral infarction affecting left non-dominant side: Secondary | ICD-10-CM | POA: Diagnosis not present

## 2021-09-16 DIAGNOSIS — L89153 Pressure ulcer of sacral region, stage 3: Secondary | ICD-10-CM | POA: Diagnosis not present

## 2021-09-16 DIAGNOSIS — L89892 Pressure ulcer of other site, stage 2: Secondary | ICD-10-CM | POA: Diagnosis not present

## 2021-09-16 DIAGNOSIS — I110 Hypertensive heart disease with heart failure: Secondary | ICD-10-CM | POA: Diagnosis not present

## 2021-09-16 DIAGNOSIS — I69318 Other symptoms and signs involving cognitive functions following cerebral infarction: Secondary | ICD-10-CM | POA: Diagnosis not present

## 2021-09-17 DIAGNOSIS — I509 Heart failure, unspecified: Secondary | ICD-10-CM | POA: Diagnosis not present

## 2021-09-17 DIAGNOSIS — I619 Nontraumatic intracerebral hemorrhage, unspecified: Secondary | ICD-10-CM | POA: Diagnosis not present

## 2021-09-17 DIAGNOSIS — L8990 Pressure ulcer of unspecified site, unspecified stage: Secondary | ICD-10-CM | POA: Diagnosis not present

## 2021-09-17 DIAGNOSIS — I4819 Other persistent atrial fibrillation: Secondary | ICD-10-CM | POA: Diagnosis not present

## 2021-09-17 DIAGNOSIS — I110 Hypertensive heart disease with heart failure: Secondary | ICD-10-CM | POA: Diagnosis not present

## 2021-09-17 DIAGNOSIS — L89892 Pressure ulcer of other site, stage 2: Secondary | ICD-10-CM | POA: Diagnosis not present

## 2021-09-17 DIAGNOSIS — L89153 Pressure ulcer of sacral region, stage 3: Secondary | ICD-10-CM | POA: Diagnosis not present

## 2021-09-17 DIAGNOSIS — I6340 Cerebral infarction due to embolism of unspecified cerebral artery: Secondary | ICD-10-CM | POA: Diagnosis not present

## 2021-09-17 DIAGNOSIS — I69318 Other symptoms and signs involving cognitive functions following cerebral infarction: Secondary | ICD-10-CM | POA: Diagnosis not present

## 2021-09-17 DIAGNOSIS — I69354 Hemiplegia and hemiparesis following cerebral infarction affecting left non-dominant side: Secondary | ICD-10-CM | POA: Diagnosis not present

## 2021-09-18 DIAGNOSIS — I509 Heart failure, unspecified: Secondary | ICD-10-CM | POA: Diagnosis not present

## 2021-09-18 DIAGNOSIS — I69354 Hemiplegia and hemiparesis following cerebral infarction affecting left non-dominant side: Secondary | ICD-10-CM | POA: Diagnosis not present

## 2021-09-18 DIAGNOSIS — L89892 Pressure ulcer of other site, stage 2: Secondary | ICD-10-CM | POA: Diagnosis not present

## 2021-09-18 DIAGNOSIS — I110 Hypertensive heart disease with heart failure: Secondary | ICD-10-CM | POA: Diagnosis not present

## 2021-09-18 DIAGNOSIS — I69318 Other symptoms and signs involving cognitive functions following cerebral infarction: Secondary | ICD-10-CM | POA: Diagnosis not present

## 2021-09-18 DIAGNOSIS — L89153 Pressure ulcer of sacral region, stage 3: Secondary | ICD-10-CM | POA: Diagnosis not present

## 2021-09-19 DIAGNOSIS — I509 Heart failure, unspecified: Secondary | ICD-10-CM | POA: Diagnosis not present

## 2021-09-19 DIAGNOSIS — L89892 Pressure ulcer of other site, stage 2: Secondary | ICD-10-CM | POA: Diagnosis not present

## 2021-09-19 DIAGNOSIS — I69318 Other symptoms and signs involving cognitive functions following cerebral infarction: Secondary | ICD-10-CM | POA: Diagnosis not present

## 2021-09-19 DIAGNOSIS — L89153 Pressure ulcer of sacral region, stage 3: Secondary | ICD-10-CM | POA: Diagnosis not present

## 2021-09-19 DIAGNOSIS — I69354 Hemiplegia and hemiparesis following cerebral infarction affecting left non-dominant side: Secondary | ICD-10-CM | POA: Diagnosis not present

## 2021-09-19 DIAGNOSIS — I110 Hypertensive heart disease with heart failure: Secondary | ICD-10-CM | POA: Diagnosis not present

## 2021-09-20 DIAGNOSIS — I69318 Other symptoms and signs involving cognitive functions following cerebral infarction: Secondary | ICD-10-CM | POA: Diagnosis not present

## 2021-09-20 DIAGNOSIS — L89892 Pressure ulcer of other site, stage 2: Secondary | ICD-10-CM | POA: Diagnosis not present

## 2021-09-20 DIAGNOSIS — I110 Hypertensive heart disease with heart failure: Secondary | ICD-10-CM | POA: Diagnosis not present

## 2021-09-20 DIAGNOSIS — I69354 Hemiplegia and hemiparesis following cerebral infarction affecting left non-dominant side: Secondary | ICD-10-CM | POA: Diagnosis not present

## 2021-09-20 DIAGNOSIS — L89153 Pressure ulcer of sacral region, stage 3: Secondary | ICD-10-CM | POA: Diagnosis not present

## 2021-09-20 DIAGNOSIS — I509 Heart failure, unspecified: Secondary | ICD-10-CM | POA: Diagnosis not present

## 2021-09-21 DIAGNOSIS — L89153 Pressure ulcer of sacral region, stage 3: Secondary | ICD-10-CM | POA: Diagnosis not present

## 2021-09-21 DIAGNOSIS — I509 Heart failure, unspecified: Secondary | ICD-10-CM | POA: Diagnosis not present

## 2021-09-21 DIAGNOSIS — L89892 Pressure ulcer of other site, stage 2: Secondary | ICD-10-CM | POA: Diagnosis not present

## 2021-09-21 DIAGNOSIS — I110 Hypertensive heart disease with heart failure: Secondary | ICD-10-CM | POA: Diagnosis not present

## 2021-09-21 DIAGNOSIS — I69318 Other symptoms and signs involving cognitive functions following cerebral infarction: Secondary | ICD-10-CM | POA: Diagnosis not present

## 2021-09-21 DIAGNOSIS — I69354 Hemiplegia and hemiparesis following cerebral infarction affecting left non-dominant side: Secondary | ICD-10-CM | POA: Diagnosis not present

## 2021-09-22 DIAGNOSIS — I509 Heart failure, unspecified: Secondary | ICD-10-CM | POA: Diagnosis not present

## 2021-09-22 DIAGNOSIS — L89892 Pressure ulcer of other site, stage 2: Secondary | ICD-10-CM | POA: Diagnosis not present

## 2021-09-22 DIAGNOSIS — I110 Hypertensive heart disease with heart failure: Secondary | ICD-10-CM | POA: Diagnosis not present

## 2021-09-22 DIAGNOSIS — I69318 Other symptoms and signs involving cognitive functions following cerebral infarction: Secondary | ICD-10-CM | POA: Diagnosis not present

## 2021-09-22 DIAGNOSIS — L89894 Pressure ulcer of other site, stage 4: Secondary | ICD-10-CM | POA: Diagnosis not present

## 2021-09-22 DIAGNOSIS — I69354 Hemiplegia and hemiparesis following cerebral infarction affecting left non-dominant side: Secondary | ICD-10-CM | POA: Diagnosis not present

## 2021-09-22 DIAGNOSIS — L89153 Pressure ulcer of sacral region, stage 3: Secondary | ICD-10-CM | POA: Diagnosis not present

## 2021-09-23 DIAGNOSIS — I69318 Other symptoms and signs involving cognitive functions following cerebral infarction: Secondary | ICD-10-CM | POA: Diagnosis not present

## 2021-09-23 DIAGNOSIS — I69354 Hemiplegia and hemiparesis following cerebral infarction affecting left non-dominant side: Secondary | ICD-10-CM | POA: Diagnosis not present

## 2021-09-23 DIAGNOSIS — I110 Hypertensive heart disease with heart failure: Secondary | ICD-10-CM | POA: Diagnosis not present

## 2021-09-23 DIAGNOSIS — I509 Heart failure, unspecified: Secondary | ICD-10-CM | POA: Diagnosis not present

## 2021-09-23 DIAGNOSIS — L89892 Pressure ulcer of other site, stage 2: Secondary | ICD-10-CM | POA: Diagnosis not present

## 2021-09-23 DIAGNOSIS — L89153 Pressure ulcer of sacral region, stage 3: Secondary | ICD-10-CM | POA: Diagnosis not present

## 2021-09-24 DIAGNOSIS — I110 Hypertensive heart disease with heart failure: Secondary | ICD-10-CM | POA: Diagnosis not present

## 2021-09-24 DIAGNOSIS — I69354 Hemiplegia and hemiparesis following cerebral infarction affecting left non-dominant side: Secondary | ICD-10-CM | POA: Diagnosis not present

## 2021-09-24 DIAGNOSIS — L89892 Pressure ulcer of other site, stage 2: Secondary | ICD-10-CM | POA: Diagnosis not present

## 2021-09-24 DIAGNOSIS — I509 Heart failure, unspecified: Secondary | ICD-10-CM | POA: Diagnosis not present

## 2021-09-24 DIAGNOSIS — L89153 Pressure ulcer of sacral region, stage 3: Secondary | ICD-10-CM | POA: Diagnosis not present

## 2021-09-24 DIAGNOSIS — I69318 Other symptoms and signs involving cognitive functions following cerebral infarction: Secondary | ICD-10-CM | POA: Diagnosis not present

## 2021-09-25 DIAGNOSIS — L89892 Pressure ulcer of other site, stage 2: Secondary | ICD-10-CM | POA: Diagnosis not present

## 2021-09-25 DIAGNOSIS — L89153 Pressure ulcer of sacral region, stage 3: Secondary | ICD-10-CM | POA: Diagnosis not present

## 2021-09-25 DIAGNOSIS — I110 Hypertensive heart disease with heart failure: Secondary | ICD-10-CM | POA: Diagnosis not present

## 2021-09-25 DIAGNOSIS — I69354 Hemiplegia and hemiparesis following cerebral infarction affecting left non-dominant side: Secondary | ICD-10-CM | POA: Diagnosis not present

## 2021-09-25 DIAGNOSIS — I69318 Other symptoms and signs involving cognitive functions following cerebral infarction: Secondary | ICD-10-CM | POA: Diagnosis not present

## 2021-09-25 DIAGNOSIS — I509 Heart failure, unspecified: Secondary | ICD-10-CM | POA: Diagnosis not present

## 2021-09-26 DIAGNOSIS — I69354 Hemiplegia and hemiparesis following cerebral infarction affecting left non-dominant side: Secondary | ICD-10-CM | POA: Diagnosis not present

## 2021-09-26 DIAGNOSIS — I69318 Other symptoms and signs involving cognitive functions following cerebral infarction: Secondary | ICD-10-CM | POA: Diagnosis not present

## 2021-09-26 DIAGNOSIS — L89892 Pressure ulcer of other site, stage 2: Secondary | ICD-10-CM | POA: Diagnosis not present

## 2021-09-26 DIAGNOSIS — I110 Hypertensive heart disease with heart failure: Secondary | ICD-10-CM | POA: Diagnosis not present

## 2021-09-26 DIAGNOSIS — I509 Heart failure, unspecified: Secondary | ICD-10-CM | POA: Diagnosis not present

## 2021-09-26 DIAGNOSIS — L89153 Pressure ulcer of sacral region, stage 3: Secondary | ICD-10-CM | POA: Diagnosis not present

## 2021-09-27 DIAGNOSIS — L89153 Pressure ulcer of sacral region, stage 3: Secondary | ICD-10-CM | POA: Diagnosis not present

## 2021-09-27 DIAGNOSIS — I110 Hypertensive heart disease with heart failure: Secondary | ICD-10-CM | POA: Diagnosis not present

## 2021-09-27 DIAGNOSIS — I69354 Hemiplegia and hemiparesis following cerebral infarction affecting left non-dominant side: Secondary | ICD-10-CM | POA: Diagnosis not present

## 2021-09-27 DIAGNOSIS — I69318 Other symptoms and signs involving cognitive functions following cerebral infarction: Secondary | ICD-10-CM | POA: Diagnosis not present

## 2021-09-27 DIAGNOSIS — I509 Heart failure, unspecified: Secondary | ICD-10-CM | POA: Diagnosis not present

## 2021-09-27 DIAGNOSIS — L89892 Pressure ulcer of other site, stage 2: Secondary | ICD-10-CM | POA: Diagnosis not present

## 2021-09-28 DIAGNOSIS — I110 Hypertensive heart disease with heart failure: Secondary | ICD-10-CM | POA: Diagnosis not present

## 2021-09-28 DIAGNOSIS — L89153 Pressure ulcer of sacral region, stage 3: Secondary | ICD-10-CM | POA: Diagnosis not present

## 2021-09-28 DIAGNOSIS — I509 Heart failure, unspecified: Secondary | ICD-10-CM | POA: Diagnosis not present

## 2021-09-28 DIAGNOSIS — I69354 Hemiplegia and hemiparesis following cerebral infarction affecting left non-dominant side: Secondary | ICD-10-CM | POA: Diagnosis not present

## 2021-09-28 DIAGNOSIS — L89892 Pressure ulcer of other site, stage 2: Secondary | ICD-10-CM | POA: Diagnosis not present

## 2021-09-28 DIAGNOSIS — I69318 Other symptoms and signs involving cognitive functions following cerebral infarction: Secondary | ICD-10-CM | POA: Diagnosis not present

## 2021-09-29 DIAGNOSIS — L89153 Pressure ulcer of sacral region, stage 3: Secondary | ICD-10-CM | POA: Diagnosis not present

## 2021-09-29 DIAGNOSIS — I110 Hypertensive heart disease with heart failure: Secondary | ICD-10-CM | POA: Diagnosis not present

## 2021-09-29 DIAGNOSIS — I69354 Hemiplegia and hemiparesis following cerebral infarction affecting left non-dominant side: Secondary | ICD-10-CM | POA: Diagnosis not present

## 2021-09-29 DIAGNOSIS — L89892 Pressure ulcer of other site, stage 2: Secondary | ICD-10-CM | POA: Diagnosis not present

## 2021-09-29 DIAGNOSIS — I509 Heart failure, unspecified: Secondary | ICD-10-CM | POA: Diagnosis not present

## 2021-09-29 DIAGNOSIS — I69318 Other symptoms and signs involving cognitive functions following cerebral infarction: Secondary | ICD-10-CM | POA: Diagnosis not present

## 2021-09-29 DIAGNOSIS — L89894 Pressure ulcer of other site, stage 4: Secondary | ICD-10-CM | POA: Diagnosis not present

## 2021-09-30 DIAGNOSIS — I69354 Hemiplegia and hemiparesis following cerebral infarction affecting left non-dominant side: Secondary | ICD-10-CM | POA: Diagnosis not present

## 2021-09-30 DIAGNOSIS — I509 Heart failure, unspecified: Secondary | ICD-10-CM | POA: Diagnosis not present

## 2021-09-30 DIAGNOSIS — L89892 Pressure ulcer of other site, stage 2: Secondary | ICD-10-CM | POA: Diagnosis not present

## 2021-09-30 DIAGNOSIS — L89153 Pressure ulcer of sacral region, stage 3: Secondary | ICD-10-CM | POA: Diagnosis not present

## 2021-09-30 DIAGNOSIS — I110 Hypertensive heart disease with heart failure: Secondary | ICD-10-CM | POA: Diagnosis not present

## 2021-09-30 DIAGNOSIS — I69318 Other symptoms and signs involving cognitive functions following cerebral infarction: Secondary | ICD-10-CM | POA: Diagnosis not present

## 2021-10-01 DIAGNOSIS — I110 Hypertensive heart disease with heart failure: Secondary | ICD-10-CM | POA: Diagnosis not present

## 2021-10-01 DIAGNOSIS — I509 Heart failure, unspecified: Secondary | ICD-10-CM | POA: Diagnosis not present

## 2021-10-01 DIAGNOSIS — L89892 Pressure ulcer of other site, stage 2: Secondary | ICD-10-CM | POA: Diagnosis not present

## 2021-10-01 DIAGNOSIS — I69354 Hemiplegia and hemiparesis following cerebral infarction affecting left non-dominant side: Secondary | ICD-10-CM | POA: Diagnosis not present

## 2021-10-01 DIAGNOSIS — I69318 Other symptoms and signs involving cognitive functions following cerebral infarction: Secondary | ICD-10-CM | POA: Diagnosis not present

## 2021-10-01 DIAGNOSIS — L89153 Pressure ulcer of sacral region, stage 3: Secondary | ICD-10-CM | POA: Diagnosis not present

## 2021-10-02 DIAGNOSIS — L89892 Pressure ulcer of other site, stage 2: Secondary | ICD-10-CM | POA: Diagnosis not present

## 2021-10-02 DIAGNOSIS — L89153 Pressure ulcer of sacral region, stage 3: Secondary | ICD-10-CM | POA: Diagnosis not present

## 2021-10-02 DIAGNOSIS — I69318 Other symptoms and signs involving cognitive functions following cerebral infarction: Secondary | ICD-10-CM | POA: Diagnosis not present

## 2021-10-02 DIAGNOSIS — I509 Heart failure, unspecified: Secondary | ICD-10-CM | POA: Diagnosis not present

## 2021-10-02 DIAGNOSIS — I110 Hypertensive heart disease with heart failure: Secondary | ICD-10-CM | POA: Diagnosis not present

## 2021-10-02 DIAGNOSIS — I69354 Hemiplegia and hemiparesis following cerebral infarction affecting left non-dominant side: Secondary | ICD-10-CM | POA: Diagnosis not present

## 2023-04-10 IMAGING — CT CT HEAD W/O CM
4 of 5 series · 15 of 47 positions shown, 17 images · non-contrast
Comparison: Brain MRI 07/04/2021 and earlier.

CLINICAL DATA: 85-year-old female code stroke presentation with
right MCA branch occlusion, right MCA territory infarct with some
hemorrhagic transformation.



[Series 3: head without · axial · non-contrast · 0.41mm/px · z∈[-305,-200]mm · 4 of 35 slices shown]
[im 7/35  brain]
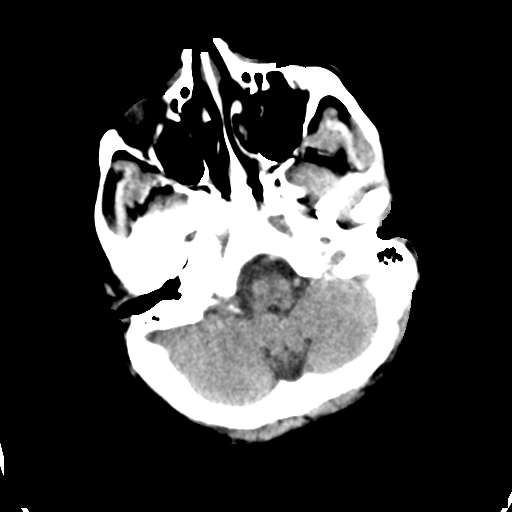
[im 14/35  brain]
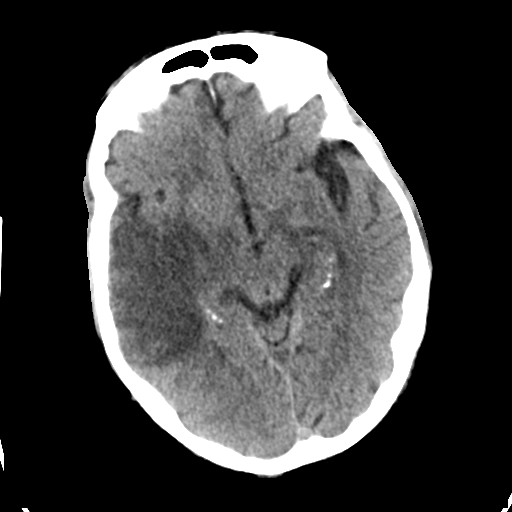
[im 21/35  brain]
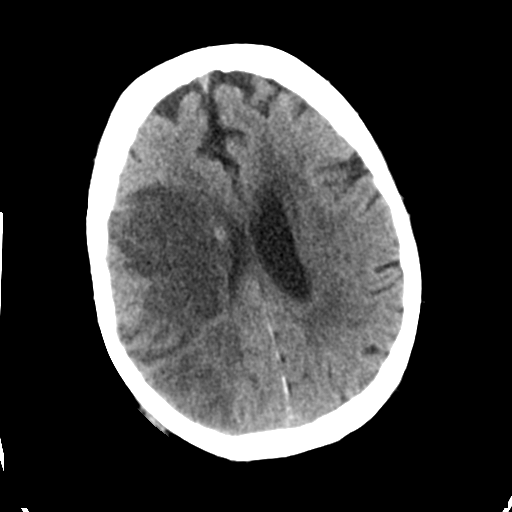
[im 28/35  brain]
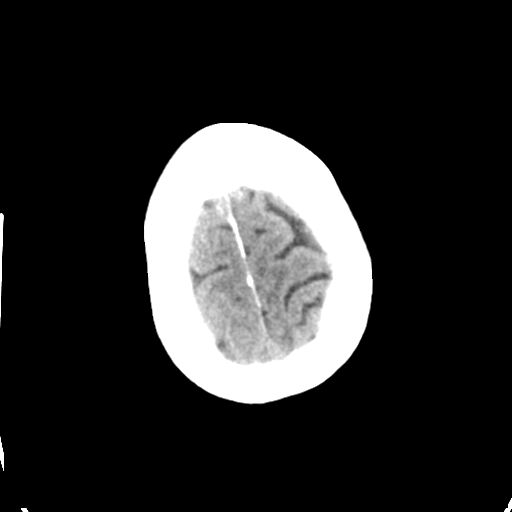

[Series 5: head without ax · axial · non-contrast · 0.34mm/px · z∈[-336,-227]mm · 5 of 34 slices shown, 7 images]
[im 6/34  brain]
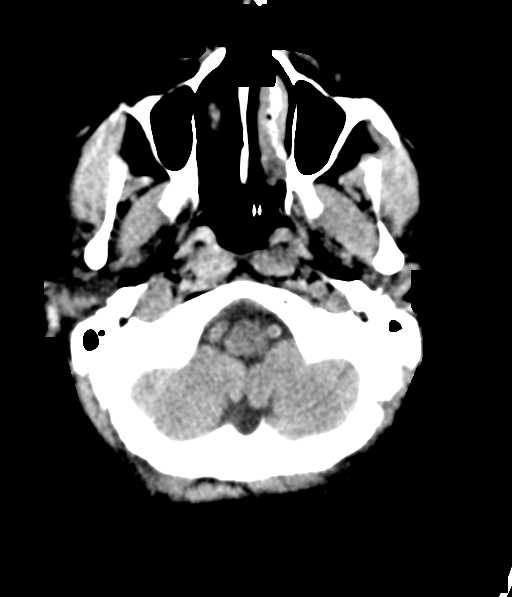
[im 6/34  bone]
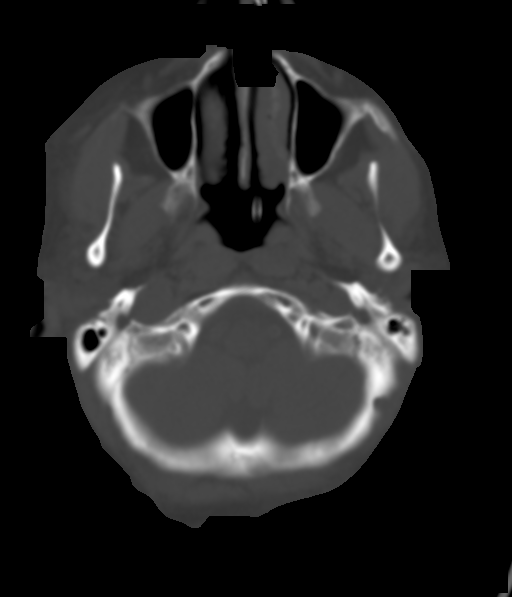
[im 12/34  brain]
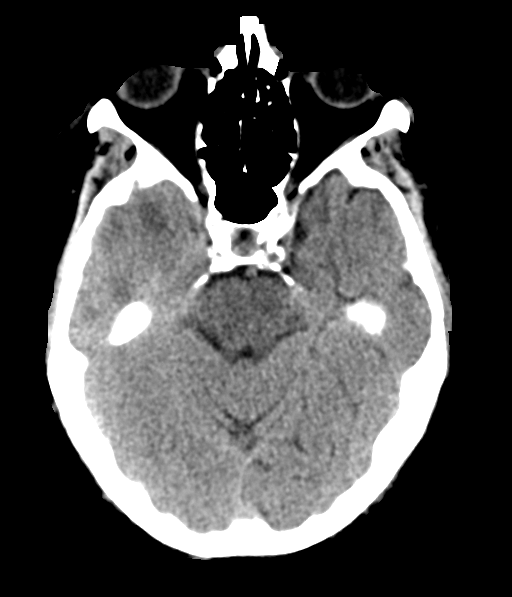
[im 17/34  brain]
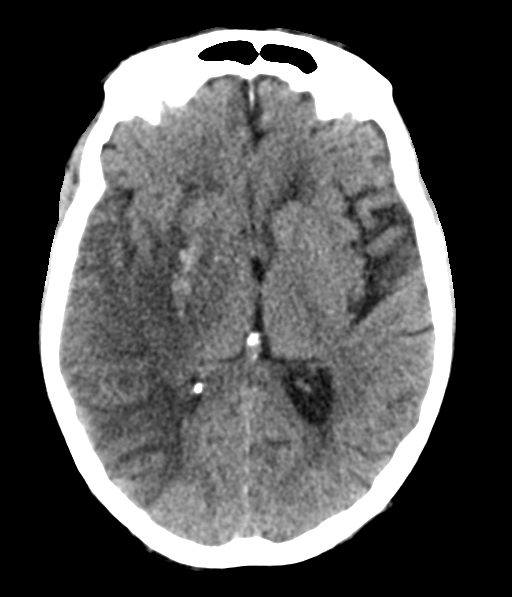
[im 23/34  brain]
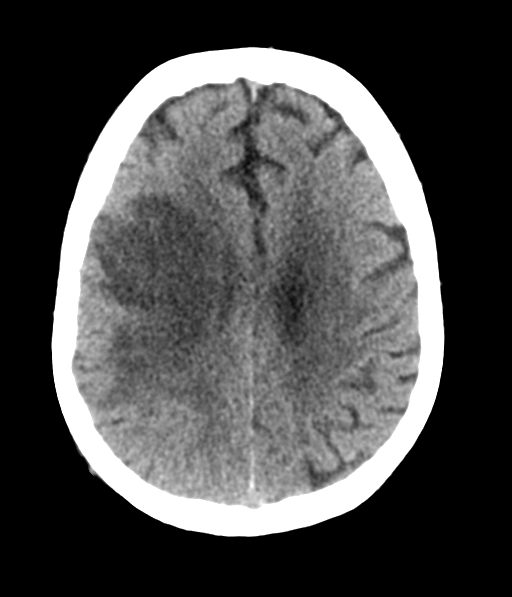
[im 28/34  brain]
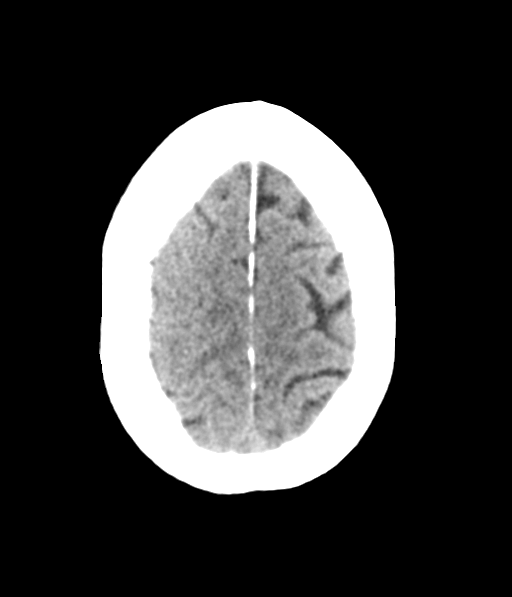
[im 28/34  bone]
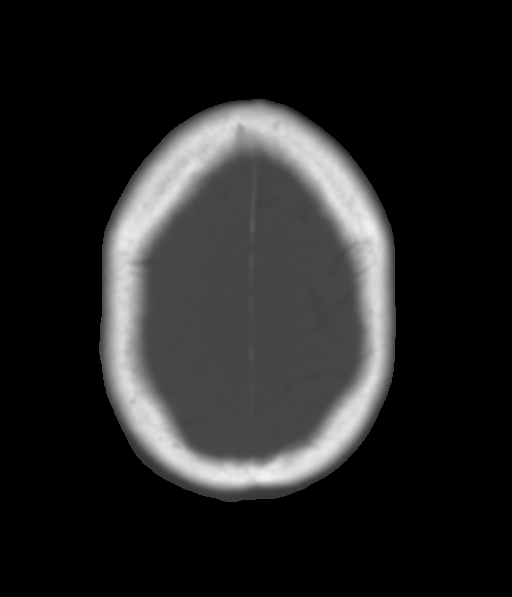

[Series 6: head without cor · coronal · non-contrast · 0.33mm/px · 3 of 67 slices shown]
[im 23/67  brain]
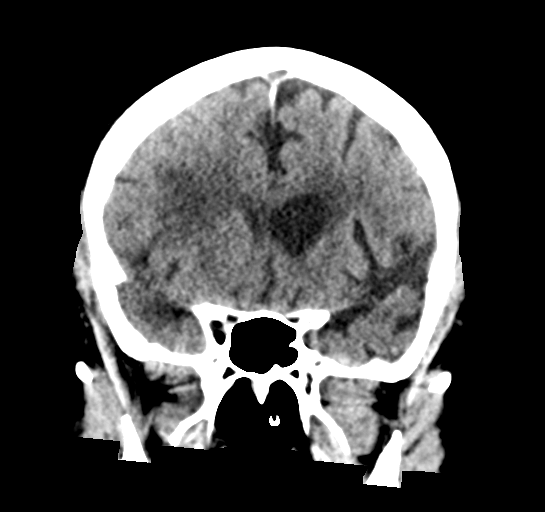
[im 30/67  brain]
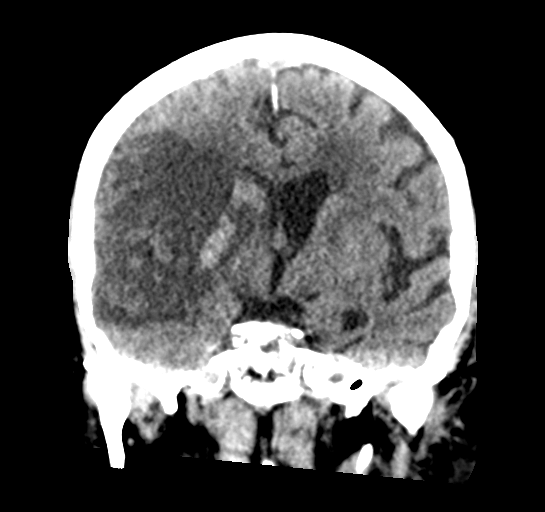
[im 37/67  brain]
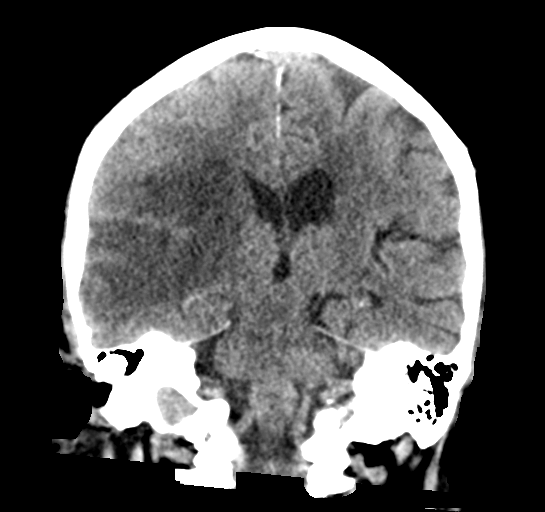

[Series 7: head without sag · sagittal · non-contrast · 0.33mm/px · 3 of 58 slices shown]
[im 20/58  brain]
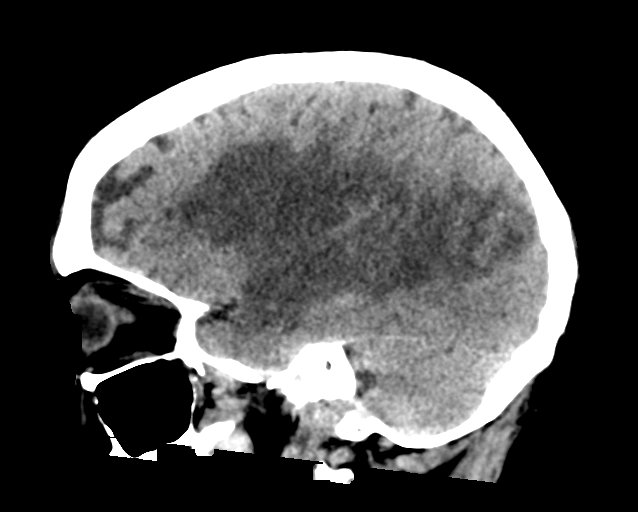
[im 29/58  brain]
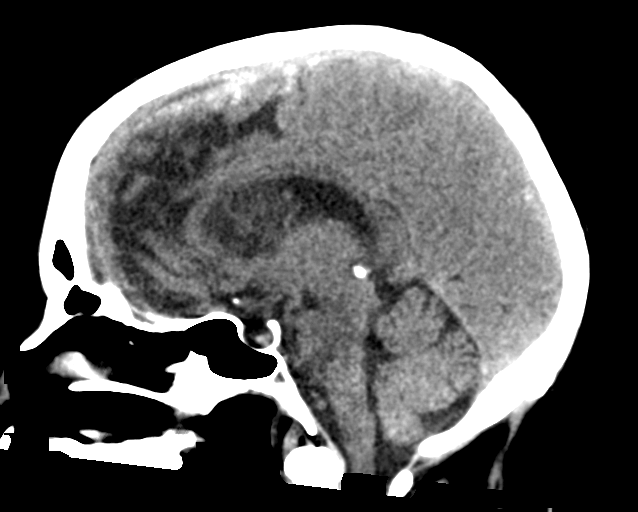
[im 39/58  brain]
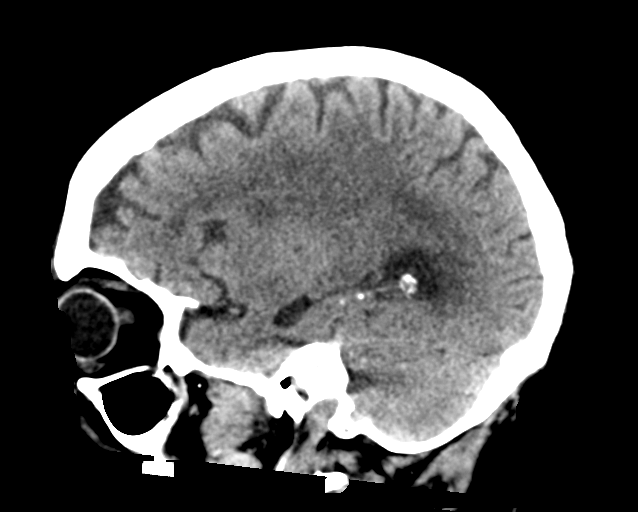

[15 of 47 positions shown; findings below may reference images not displayed]

FINDINGS: Brain: Patchy and confluent cytotoxic edema throughout much of the
right MCA territory, configuration and extent stable from DWI
abnormality on 07/04/2021. Right basal ganglia petechial hemorrhage
appears stable to mildly regressed since the CT on 07/03/2021. No
new hemorrhagic transformation identified.

But mass effect on the right lateral ventricle has progressed since
07/03/2021, and there is now trace leftward midline shift of 2 mm.
No ventriculomegaly. No IVH.

Basilar cisterns remain normal. Stable gray-white matter
differentiation in the left hemisphere and posterior fossa.

Vascular: Calcified atherosclerosis at the skull base.

Skull: No acute osseous abnormality identified.

Sinuses/Orbits: Visualized paranasal sinuses and mastoids are stable
and well aerated.

Other: Left nasoenteric tube in place. No acute orbit or scalp soft
tissue finding.
IMPRESSION: 1. Relatively large Right MCA territory infarct with cytotoxic
edema.
Petechial hemorrhage in the right basal ganglia appears stable to
slightly decreased since 07/03/2021.
But intracranial mass effect has mildly progressed, including 2 mm
of leftward midline shift now.

2. No other acute intracranial abnormality.
